# Patient Record
Sex: Female | Born: 1980 | Race: Black or African American | Hispanic: No | Marital: Single | Smoking: Never smoker
Health system: Southern US, Community
[De-identification: ages and names within clinical notes are randomized; demographics above are authoritative.]

## PROBLEM LIST (undated history)

## (undated) DIAGNOSIS — R6889 Other general symptoms and signs: Secondary | ICD-10-CM

## (undated) DIAGNOSIS — Z8742 Personal history of other diseases of the female genital tract: Secondary | ICD-10-CM

## (undated) DIAGNOSIS — Z87440 Personal history of urinary (tract) infections: Secondary | ICD-10-CM

## (undated) DIAGNOSIS — B373 Candidiasis of vulva and vagina: Secondary | ICD-10-CM

## (undated) DIAGNOSIS — Z923 Personal history of irradiation: Secondary | ICD-10-CM

## (undated) DIAGNOSIS — R102 Pelvic and perineal pain: Secondary | ICD-10-CM

## (undated) DIAGNOSIS — B379 Candidiasis, unspecified: Secondary | ICD-10-CM

## (undated) DIAGNOSIS — N760 Acute vaginitis: Secondary | ICD-10-CM

## (undated) DIAGNOSIS — B009 Herpesviral infection, unspecified: Secondary | ICD-10-CM

## (undated) DIAGNOSIS — R011 Cardiac murmur, unspecified: Secondary | ICD-10-CM

## (undated) DIAGNOSIS — Z87898 Personal history of other specified conditions: Secondary | ICD-10-CM

## (undated) DIAGNOSIS — B9689 Other specified bacterial agents as the cause of diseases classified elsewhere: Secondary | ICD-10-CM

## (undated) DIAGNOSIS — R5383 Other fatigue: Secondary | ICD-10-CM

## (undated) DIAGNOSIS — C50919 Malignant neoplasm of unspecified site of unspecified female breast: Secondary | ICD-10-CM

## (undated) DIAGNOSIS — Z8619 Personal history of other infectious and parasitic diseases: Secondary | ICD-10-CM

## (undated) HISTORY — DX: Personal history of urinary (tract) infections: Z87.440

## (undated) HISTORY — DX: Pelvic and perineal pain: R10.2

## (undated) HISTORY — DX: Candidiasis of vulva and vagina: B37.3

## (undated) HISTORY — DX: Herpesviral infection, unspecified: B00.9

## (undated) HISTORY — DX: Cardiac murmur, unspecified: R01.1

## (undated) HISTORY — DX: Other specified bacterial agents as the cause of diseases classified elsewhere: B96.89

## (undated) HISTORY — DX: Candidiasis, unspecified: B37.9

## (undated) HISTORY — DX: Malignant neoplasm of unspecified site of unspecified female breast: C50.919

## (undated) HISTORY — DX: Personal history of other specified conditions: Z87.898

## (undated) HISTORY — DX: Other general symptoms and signs: R68.89

## (undated) HISTORY — DX: Other specified bacterial agents as the cause of diseases classified elsewhere: N76.0

## (undated) HISTORY — DX: Personal history of other infectious and parasitic diseases: Z86.19

## (undated) HISTORY — DX: Other fatigue: R53.83

## (undated) HISTORY — DX: Personal history of other diseases of the female genital tract: Z87.42

---
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
## 2002-01-16 HISTORY — PX: TOE SURGERY: SHX1073

## 2002-03-26 ENCOUNTER — Other Ambulatory Visit: Admission: RE | Admit: 2002-03-26 | Discharge: 2002-03-26 | Payer: Self-pay | Admitting: Obstetrics and Gynecology

## 2003-02-27 ENCOUNTER — Inpatient Hospital Stay (HOSPITAL_COMMUNITY): Admission: AD | Admit: 2003-02-27 | Discharge: 2003-02-27 | Payer: Self-pay | Admitting: *Deleted

## 2003-03-27 ENCOUNTER — Observation Stay (HOSPITAL_COMMUNITY): Admission: AD | Admit: 2003-03-27 | Discharge: 2003-03-28 | Payer: Self-pay | Admitting: Obstetrics and Gynecology

## 2003-04-21 ENCOUNTER — Inpatient Hospital Stay (HOSPITAL_COMMUNITY): Admission: AD | Admit: 2003-04-21 | Discharge: 2003-04-21 | Payer: Self-pay | Admitting: Obstetrics and Gynecology

## 2003-07-11 ENCOUNTER — Inpatient Hospital Stay (HOSPITAL_COMMUNITY): Admission: AD | Admit: 2003-07-11 | Discharge: 2003-07-14 | Payer: Self-pay | Admitting: Obstetrics and Gynecology

## 2003-07-11 ENCOUNTER — Inpatient Hospital Stay (HOSPITAL_COMMUNITY): Admission: AD | Admit: 2003-07-11 | Discharge: 2003-07-11 | Payer: Self-pay | Admitting: Obstetrics and Gynecology

## 2003-08-25 ENCOUNTER — Other Ambulatory Visit: Admission: RE | Admit: 2003-08-25 | Discharge: 2003-08-25 | Payer: Self-pay | Admitting: Obstetrics and Gynecology

## 2003-11-17 DIAGNOSIS — Z8742 Personal history of other diseases of the female genital tract: Secondary | ICD-10-CM

## 2003-11-17 HISTORY — DX: Personal history of other diseases of the female genital tract: Z87.42

## 2004-09-13 ENCOUNTER — Other Ambulatory Visit: Admission: RE | Admit: 2004-09-13 | Discharge: 2004-09-13 | Payer: Self-pay | Admitting: Obstetrics and Gynecology

## 2005-01-16 DIAGNOSIS — R5383 Other fatigue: Secondary | ICD-10-CM

## 2005-01-16 DIAGNOSIS — R6889 Other general symptoms and signs: Secondary | ICD-10-CM

## 2005-01-16 HISTORY — DX: Other fatigue: R53.83

## 2005-01-16 HISTORY — DX: Other general symptoms and signs: R68.89

## 2005-04-16 DIAGNOSIS — B3731 Acute candidiasis of vulva and vagina: Secondary | ICD-10-CM

## 2005-04-16 DIAGNOSIS — B373 Candidiasis of vulva and vagina: Secondary | ICD-10-CM

## 2005-04-16 HISTORY — DX: Acute candidiasis of vulva and vagina: B37.31

## 2005-04-16 HISTORY — DX: Candidiasis of vulva and vagina: B37.3

## 2005-10-05 ENCOUNTER — Other Ambulatory Visit: Admission: RE | Admit: 2005-10-05 | Discharge: 2005-10-05 | Payer: Self-pay | Admitting: Obstetrics and Gynecology

## 2006-04-05 ENCOUNTER — Encounter: Admission: RE | Admit: 2006-04-05 | Discharge: 2006-04-05 | Payer: Self-pay | Admitting: Family Medicine

## 2010-02-06 ENCOUNTER — Encounter: Payer: Self-pay | Admitting: Family Medicine

## 2010-08-12 DIAGNOSIS — Z8619 Personal history of other infectious and parasitic diseases: Secondary | ICD-10-CM

## 2010-08-12 HISTORY — DX: Personal history of other infectious and parasitic diseases: Z86.19

## 2011-01-25 DIAGNOSIS — N760 Acute vaginitis: Secondary | ICD-10-CM | POA: Insufficient documentation

## 2011-01-25 DIAGNOSIS — B9689 Other specified bacterial agents as the cause of diseases classified elsewhere: Secondary | ICD-10-CM | POA: Insufficient documentation

## 2011-06-01 ENCOUNTER — Telehealth: Payer: Self-pay | Admitting: Obstetrics and Gynecology

## 2011-06-01 NOTE — Telephone Encounter (Signed)
Triage/epic 

## 2011-06-05 NOTE — Telephone Encounter (Signed)
Pt states she had TAB 1 week ago and wants IUD asap.  Wanted appt with EP.  Sch for 06-15-11.  ld

## 2011-06-05 NOTE — Telephone Encounter (Signed)
TAB  05-31-11

## 2011-06-05 NOTE — Telephone Encounter (Signed)
sr pt 

## 2011-06-14 DIAGNOSIS — B9689 Other specified bacterial agents as the cause of diseases classified elsewhere: Secondary | ICD-10-CM

## 2011-06-15 ENCOUNTER — Encounter: Payer: Self-pay | Admitting: Obstetrics and Gynecology

## 2011-06-27 ENCOUNTER — Ambulatory Visit: Payer: Self-pay | Admitting: Obstetrics and Gynecology

## 2011-06-30 ENCOUNTER — Encounter: Payer: Self-pay | Admitting: Obstetrics and Gynecology

## 2011-08-15 ENCOUNTER — Telehealth: Payer: Self-pay | Admitting: Obstetrics and Gynecology

## 2011-08-15 ENCOUNTER — Other Ambulatory Visit: Payer: Self-pay

## 2011-08-15 MED ORDER — VALACYCLOVIR HCL 500 MG PO TABS: 500.0000 mg | ORAL_TABLET | Freq: Every day | ORAL | Status: AC

## 2011-08-15 NOTE — Telephone Encounter (Signed)
TC TO PT REGARDING REQUEST FOR MED VALTREX. INFORMED PT THAT I NEED TO SCHEDULE AN AEX BEFORE I SEND IN RX . SCHEDULED PT AEX ON 08-24-11 AT 2:15 PM. PT ALSO STATED THAT SHE HAD A EAB IN MAY AND WANT TO MAKE SURE EVERYTHING IS OKAY.

## 2011-08-24 ENCOUNTER — Ambulatory Visit: Payer: Self-pay | Admitting: Obstetrics and Gynecology

## 2011-09-14 ENCOUNTER — Ambulatory Visit: Payer: Self-pay | Admitting: Obstetrics and Gynecology

## 2011-10-05 ENCOUNTER — Ambulatory Visit (INDEPENDENT_AMBULATORY_CARE_PROVIDER_SITE_OTHER): Payer: BC Managed Care – PPO | Admitting: Obstetrics and Gynecology

## 2011-10-05 ENCOUNTER — Encounter: Payer: Self-pay | Admitting: Obstetrics and Gynecology

## 2011-10-05 VITALS — BP 118/78 | HR 78 | Ht 66.5 in | Wt 236.0 lb

## 2011-10-05 DIAGNOSIS — Z309 Encounter for contraceptive management, unspecified: Secondary | ICD-10-CM

## 2011-10-05 DIAGNOSIS — Z124 Encounter for screening for malignant neoplasm of cervix: Secondary | ICD-10-CM

## 2011-10-05 DIAGNOSIS — IMO0001 Reserved for inherently not codable concepts without codable children: Secondary | ICD-10-CM

## 2011-10-05 DIAGNOSIS — Z01419 Encounter for gynecological examination (general) (routine) without abnormal findings: Secondary | ICD-10-CM

## 2011-10-05 MED ORDER — NORGESTIMATE-ETH ESTRADIOL 0.25-35 MG-MCG PO TABS: 1.0000 | ORAL_TABLET | Freq: Every day | ORAL | Status: DC

## 2011-10-05 MED ORDER — AMBULATORY NON FORMULARY MEDICATION: 600.0000 mg | Status: DC

## 2011-10-05 NOTE — Progress Notes (Signed)
Subjective:    Kara Webb is a 31 y.o. female, G3P1011, who presents for an annual exam. The patient wants a type of contraception.  Has used IUD, BCPs and Implanon in the past.  Also wants GC/CT testing.  Menstrual cycle:   LMP: Patient's last menstrual period was 09/18/2011.             Review of Systems Pertinent items are noted in HPI. Denies pelvic pain, urinary tract symptoms, vaginitis symptoms, irregular bleeding, menopausal symptoms, change in bowel habits or rectal bleeding   Objective:    BP 118/78  Pulse 78  Ht 5' 6.5" (1.689 m)  Wt 236 lb (107.049 kg)  BMI 37.52 kg/m2  LMP 09/18/2011 Wt Readings from Last 1 Encounters:  10/05/11 236 lb (107.049 kg)   Body mass index is 37.52 kg/(m^2). General Appearance: Alert, no acute distress HEENT: Grossly normal Neck / Thyroid: Supple, no thyromegaly or cervical adenopathy Lungs: Clear to auscultation bilaterally Back: No CVA tenderness Breast Exam: No masses or nodes.No dimpling, nipple retraction or discharge. Cardiovascular: Regular rate and rhythm.  Gastrointestinal: Soft, non-tender, no masses or organomegaly Pelvic Exam: EGBUS-wnl, vagina-normal rugae, cervix- without lesions or tenderness, uterus appears normal size shape and consistency, adnexae-no masses or tenderness Rectovaginal: no masses and normal sphincter tone Lymphatic Exam: Non-palpable nodes in neck, clavicular,  axillary, or inguinal regions  Skin: no rashes or abnormalities Extremities: no clubbing cyanosis or edema  Neurologic: grossly normal Psychiatric: Alert and oriented  UPT: negative   Assessment:   Routine GYN Exam   Plan:  Given handout on contraception and reviewed contents   Sprintec # 1 1 po qd starting Sunday after menses with BUM x 1st cycle 11 refills  Boric Acid Capsule 600 mg #30 1 pv twice weekly prn 11 refills  BCP Instruction sheet given  PAP sent  RTO 1 year or prn  Pier Bosher,ELMIRAPA-C

## 2011-10-05 NOTE — Progress Notes (Signed)
Regular Periods: yes Mammogram: no  Monthly Breast Ex.: yes Exercise: no  Tetanus < 10 years: yes Seatbelts: yes  NI. Bladder Functn.: yes Abuse at home: no  Daily BM's: yes Stressful Work: yes  Healthy Diet: yes Sigmoid-Colonoscopy: NO  Calcium: no Medical problems this year: WANT BIRTH CONTROL   LAST PAP:6/12  Contraception: NONE  Mammogram:  NO  PCP: DR. Pecola Leisure  PMH: NO CHANGE  FMH: NO CHANGE  Last Bone Scan: NO  PT IS SINGLE

## 2011-10-09 LAB — PAP IG, CT-NG, RFX HPV ASCU: Chlamydia Probe Amp: NEGATIVE

## 2013-11-17 ENCOUNTER — Encounter: Payer: Self-pay | Admitting: Obstetrics and Gynecology

## 2014-09-10 ENCOUNTER — Other Ambulatory Visit: Payer: Self-pay

## 2014-09-10 ENCOUNTER — Other Ambulatory Visit: Payer: Self-pay | Admitting: Family Medicine

## 2014-09-10 DIAGNOSIS — M79605 Pain in left leg: Secondary | ICD-10-CM

## 2014-09-11 ENCOUNTER — Ambulatory Visit
Admission: RE | Admit: 2014-09-11 | Discharge: 2014-09-11 | Disposition: A | Discharge status: Home / Self Care | Payer: 59 | Source: Ambulatory Visit | Attending: Family Medicine | Admitting: Family Medicine

## 2014-09-11 DIAGNOSIS — M79605 Pain in left leg: Secondary | ICD-10-CM

## 2018-09-24 ENCOUNTER — Other Ambulatory Visit: Payer: Self-pay

## 2018-09-24 DIAGNOSIS — R0789 Other chest pain: Secondary | ICD-10-CM | POA: Diagnosis not present

## 2018-09-24 DIAGNOSIS — R0602 Shortness of breath: Secondary | ICD-10-CM | POA: Diagnosis present

## 2018-09-24 DIAGNOSIS — Z79899 Other long term (current) drug therapy: Secondary | ICD-10-CM | POA: Diagnosis not present

## 2018-09-24 DIAGNOSIS — J9801 Acute bronchospasm: Secondary | ICD-10-CM | POA: Insufficient documentation

## 2018-09-25 ENCOUNTER — Emergency Department (HOSPITAL_COMMUNITY)
Admission: EM | Admit: 2018-09-25 | Discharge: 2018-09-25 | Disposition: A | Discharge status: Home / Self Care | Payer: Managed Care, Other (non HMO) | Attending: Emergency Medicine | Admitting: Emergency Medicine

## 2018-09-25 ENCOUNTER — Other Ambulatory Visit: Payer: Self-pay

## 2018-09-25 ENCOUNTER — Emergency Department (HOSPITAL_COMMUNITY): Payer: Managed Care, Other (non HMO)

## 2018-09-25 ENCOUNTER — Encounter (HOSPITAL_COMMUNITY): Payer: Self-pay | Admitting: Family Medicine

## 2018-09-25 DIAGNOSIS — J9801 Acute bronchospasm: Secondary | ICD-10-CM

## 2018-09-25 DIAGNOSIS — R0789 Other chest pain: Secondary | ICD-10-CM

## 2018-09-25 LAB — BASIC METABOLIC PANEL
Anion gap: 8 (ref 5–15)
BUN: 15 mg/dL (ref 6–20)
CO2: 25 mmol/L (ref 22–32)
Calcium: 9.7 mg/dL (ref 8.9–10.3)
Chloride: 105 mmol/L (ref 98–111)
Creatinine, Ser: 0.75 mg/dL (ref 0.44–1.00)
GFR calc Af Amer: >60 mL/min (ref >60)
GFR calc non Af Amer: >60 mL/min (ref >60)
Glucose, Bld: 112 mg/dL — ABNORMAL HIGH (ref 70–99)
Potassium: 3.5 mmol/L (ref 3.5–5.1)
Sodium: 138 mmol/L (ref 135–145)

## 2018-09-25 LAB — CBC
HCT: 39.4 % (ref 36.0–46.0)
Hemoglobin: 12.5 g/dL (ref 12.0–15.0)
MCH: 26.3 pg (ref 26.0–34.0)
MCHC: 31.7 g/dL (ref 30.0–36.0)
MCV: 82.8 fL (ref 80.0–100.0)
Platelets: 202 10*3/uL (ref 150–400)
RBC: 4.76 MIL/uL (ref 3.87–5.11)
RDW: 14.4 % (ref 11.5–15.5)
WBC: 6.9 10*3/uL (ref 4.0–10.5)
nRBC: 0 % (ref 0.0–0.2)

## 2018-09-25 LAB — I-STAT BETA HCG BLOOD, ED (NOT ORDERABLE): I-stat hCG, quantitative: <5 m[IU]/mL (ref <5)

## 2018-09-25 LAB — TROPONIN I (HIGH SENSITIVITY)
Troponin I (High Sensitivity): <2 ng/L (ref <18)
Troponin I (High Sensitivity): <2 ng/L (ref <18)

## 2018-09-25 MED ORDER — SODIUM CHLORIDE 0.9% FLUSH: 3.0000 mL | Freq: Once | INTRAVENOUS | Status: DC

## 2018-09-25 MED ORDER — ALBUTEROL SULFATE HFA 108 (90 BASE) MCG/ACT IN AERS
2.0000 | INHALATION_SPRAY | RESPIRATORY_TRACT | Status: DC | PRN
  Administered 2018-09-25: 2 via RESPIRATORY_TRACT
  Filled 2018-09-25 (×2): qty 6.7

## 2018-09-25 MED ORDER — NAPROXEN 500 MG PO TABS
500.0000 mg | ORAL_TABLET | Freq: Once | ORAL | Status: AC
  Administered 2018-09-25: 500 mg via ORAL
  Filled 2018-09-25: qty 1

## 2018-09-25 MED ORDER — AEROCHAMBER Z-STAT PLUS/MEDIUM MISC
1.0000 | Freq: Once | Status: AC
  Administered 2018-09-25: 1
  Filled 2018-09-25: qty 1

## 2018-09-25 NOTE — ED Provider Notes (Signed)
[facility] DEPT Provider Note: Georgena Spurling, MD, FACEP  CSN: [id number] MRN: [id number] ARRIVAL: 09/24/18 at 2341 ROOM: [place]  Chest Pain and Shortness of Breath   HISTORY OF PRESENT ILLNESS  09/25/18 4:47 AM Kara Webb is a 38 y.o. female who has had shortness of breath of moderate severity, worse with exertion, for about the past 3 weeks.  She is also had an achy pain in her right upper chest.  This pain is intermittent.  It is not pleuritic.  It is worse when supine and improved with sitting or standing up.  She rates his pain as a 3 out of 10.  She has not had leg swelling or pain.  She has not had a cough.  She has no personal or family history of asthma.  She googled her symptoms last night and this made her anxious that something serious may be wrong.   Past Medical History:  Diagnosis Date  . BV (bacterial vaginosis)   . Cold intolerance 01/2005  . Fatigue 01/2005  . H/O chlamydia infection 08/12/10  . H/O urinary frequency 11/2000  . H/O vaginal discharge 11/2003  . H/O varicella   . Heart murmur    Born with murmur  . HSV-1 infection   . Hx: UTI (urinary tract infection)   . Pelvic pain   . Vulvovaginal candidiasis 04/2005  . Yeast infection     Past Surgical History:  Procedure Laterality Date  . TOE SURGERY  2004  . WISDOM TOOTH EXTRACTION  2003    Family History  Problem Relation Age of Onset  . Hypertension Maternal Grandmother   . Diabetes Maternal Grandmother   . Hypertension Maternal Grandfather     Social History   Tobacco Use  . Smoking status: Never Smoker  . Smokeless tobacco: Never Used  Substance Use Topics  . Alcohol use: No  . Drug use: No    Prior to Admission medications   Medication Sig Start Date End Date Taking? Authorizing Provider  AMBULATORY NON FORMULARY MEDICATION Place 600 mg vaginally 2 (two) times a week. Medication Name: Boric Acid Capsules 600 mg 1 pv twice a week prn 10/05/11   Earnstine Regal, PA-C  norgestimate-ethinyl estradiol (ORTHO-CYCLEN,SPRINTEC,PREVIFEM) 0.25-35 MG-MCG tablet Take 1 tablet by mouth daily. 10/05/11   Earnstine Regal, PA-C    Allergies Patient has no known allergies.   REVIEW OF SYSTEMS  Negative except as noted here or in the History of Present Illness.   PHYSICAL EXAMINATION  Initial Vital Signs Blood pressure (!) 155/115, pulse 96, temperature 99.5 F (37.5 C), temperature source Oral, resp. rate 18, height 5\' 6"  (1.676 m), weight 132 kg, SpO2 100 %.  Examination General: Well-developed, well-nourished female in no acute distress; appearance consistent with age of record HENT: normocephalic; atraumatic Eyes: Normal appearance Neck: supple Heart: regular rate and rhythm Lungs: Decreased air movement without frank wheezing Abdomen: soft; nondistended; nontender; bowel sounds present Extremities: No deformity; full range of motion; pulses normal; no edema Neurologic: Awake, alert and oriented; motor function intact in all extremities and symmetric; no facial droop Skin: Warm and dry Psychiatric: Normal mood and affect   RESULTS  Summary of this visit's results, reviewed by myself:   EKG Interpretation  Date/Time:  Wednesday September 25 2018 00:46:09 EDT Ventricular Rate:  103 PR Interval:    QRS Duration: 89 QT Interval:  329 QTC Calculation: 431 R Axis:   43 Text Interpretation:  Sinus tachycardia Low voltage, precordial  leads No previous ECGs available Confirmed by Denyla Cortese, Jonny RuizJohn ([id number]) on 09/25/2018 12:58:31 AM      Laboratory Studies: Results for orders placed or performed during the hospital encounter of 09/25/18 (from the past 24 hour(s))  Basic metabolic panel     Status: Abnormal   Collection Time: 09/25/18 12:52 AM  Result Value Ref Range   Sodium 138 135 - 145 mmol/L   Potassium 3.5 3.5 - 5.1 mmol/L   Chloride 105 98 - 111 mmol/L   CO2 25 22 - 32 mmol/L   Glucose, Bld 112 (H) 70 - 99 mg/dL   BUN 15 6 - 20 mg/dL    Creatinine, Ser 2.950.75 0.44 - 1.00 mg/dL   Calcium 9.7 8.9 - 28.410.3 mg/dL   GFR calc non Af Amer >60 >60 mL/min   GFR calc Af Amer >60 >60 mL/min   Anion gap 8 5 - 15  CBC     Status: None   Collection Time: 09/25/18 12:52 AM  Result Value Ref Range   WBC 6.9 4.0 - 10.5 K/uL   RBC 4.76 3.87 - 5.11 MIL/uL   Hemoglobin 12.5 12.0 - 15.0 g/dL   HCT 13.239.4 44.036.0 - 10.246.0 %   MCV 82.8 80.0 - 100.0 fL   MCH 26.3 26.0 - 34.0 pg   MCHC 31.7 30.0 - 36.0 g/dL   RDW 72.514.4 36.611.5 - 44.015.5 %   Platelets 202 150 - 400 K/uL   nRBC 0.0 0.0 - 0.2 %  Troponin I (High Sensitivity)     Status: None   Collection Time: 09/25/18 12:52 AM  Result Value Ref Range   Troponin I (High Sensitivity) <2 <18 ng/L  I-Stat beta hCG blood, ED     Status: None   Collection Time: 09/25/18  1:01 AM  Result Value Ref Range   I-stat hCG, quantitative <5.0 <5 mIU/mL   Comment 3          Troponin I (High Sensitivity)     Status: None   Collection Time: 09/25/18  5:47 AM  Result Value Ref Range   Troponin I (High Sensitivity) <2.00 <18 ng/L   Imaging Studies: Dg Chest 2 View  Result Date: 09/25/2018 CLINICAL DATA:  Mid chest pain EXAM: CHEST - 2 VIEW COMPARISON:  None. FINDINGS: Heart and mediastinal contours are within normal limits. No focal opacities or effusions. No acute bony abnormality. IMPRESSION: No active cardiopulmonary disease. Electronically Signed   By: Charlett NoseKevin  Dover M.D.   On: 09/25/2018 00:42    ED COURSE and MDM  Nursing notes and initial vitals signs, including pulse oximetry, reviewed.  Vitals:   09/25/18 0014 09/25/18 0549  BP: (!) 155/115 (!) 144/96  Pulse: 96 81  Resp: 18 17  Temp: 99.5 F (37.5 C)   TempSrc: Oral   SpO2: 100% 98%  Weight: 132 kg   Height: 5\' 6"  (1.676 m)    Wells score 0.   6:34 AM Patient's dyspnea relieved, air movement improved after albuterol inhaler treatment.  Patient is likely having bronchospasm possibly due to environmental exposure.  She has no personal or family  history of asthma.  Chest pain may be due to musculoskeletal irritation from dyspnea.  Her well score is 0 and her diagnostic work-up was reassuring.  PROCEDURES    ED DIAGNOSES     ICD-10-CM   1. Acute bronchospasm  J98.01   2. Atypical chest pain  R07.89        Paula LibraMolpus, Erice Ahles, MD 09/25/18 [phone]

## 2018-09-25 NOTE — ED Triage Notes (Signed)
Patient is complaining of right sided chest pain and shortness of breath that started 3 weeks ago. Chest pain is described as intermittent dull, cramping pain. Denies the pain radiates. No other associated symptoms besides shortness of breath. Patient states she has had a lot of anxiety lately.

## 2018-09-25 NOTE — ED Notes (Signed)
Pt came up to this writer x2 complaining that no one has came into check on her since she has been back in her room for 2 hours. I explained to the patient both times she has addressed me, that her nurse, her techs, and the doctor has been in another room with a critical patient.  Patient rolled eyes and walked away very irritated and slammed door when she returned to her room.

## 2021-03-17 IMAGING — CR DG CHEST 2V
2 series · 2 of 2 positions shown · non-contrast
Comparison: None.

CLINICAL DATA: Mid chest pain

EXAM:
CHEST - 2 VIEW

[w chest pa]
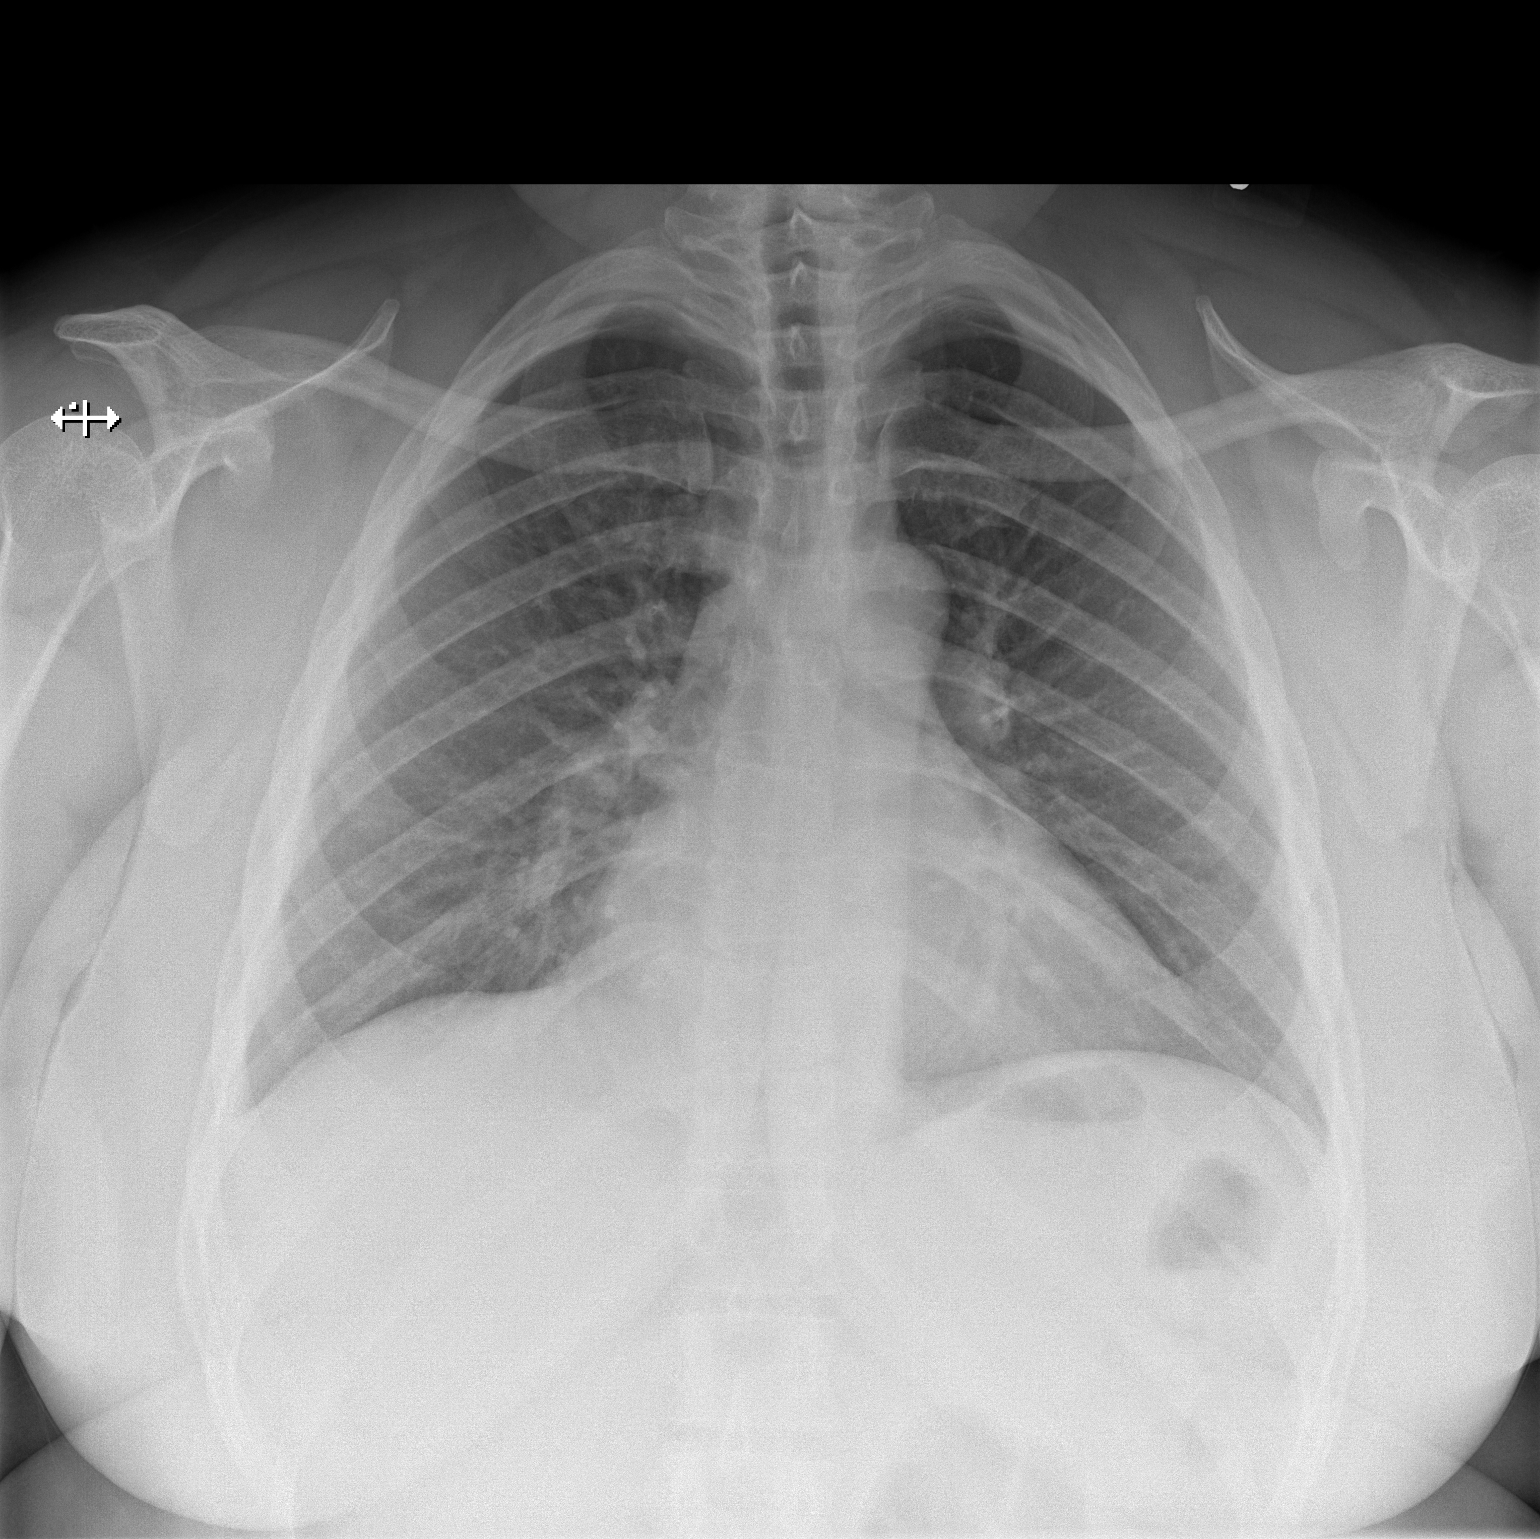

[w chest lat]
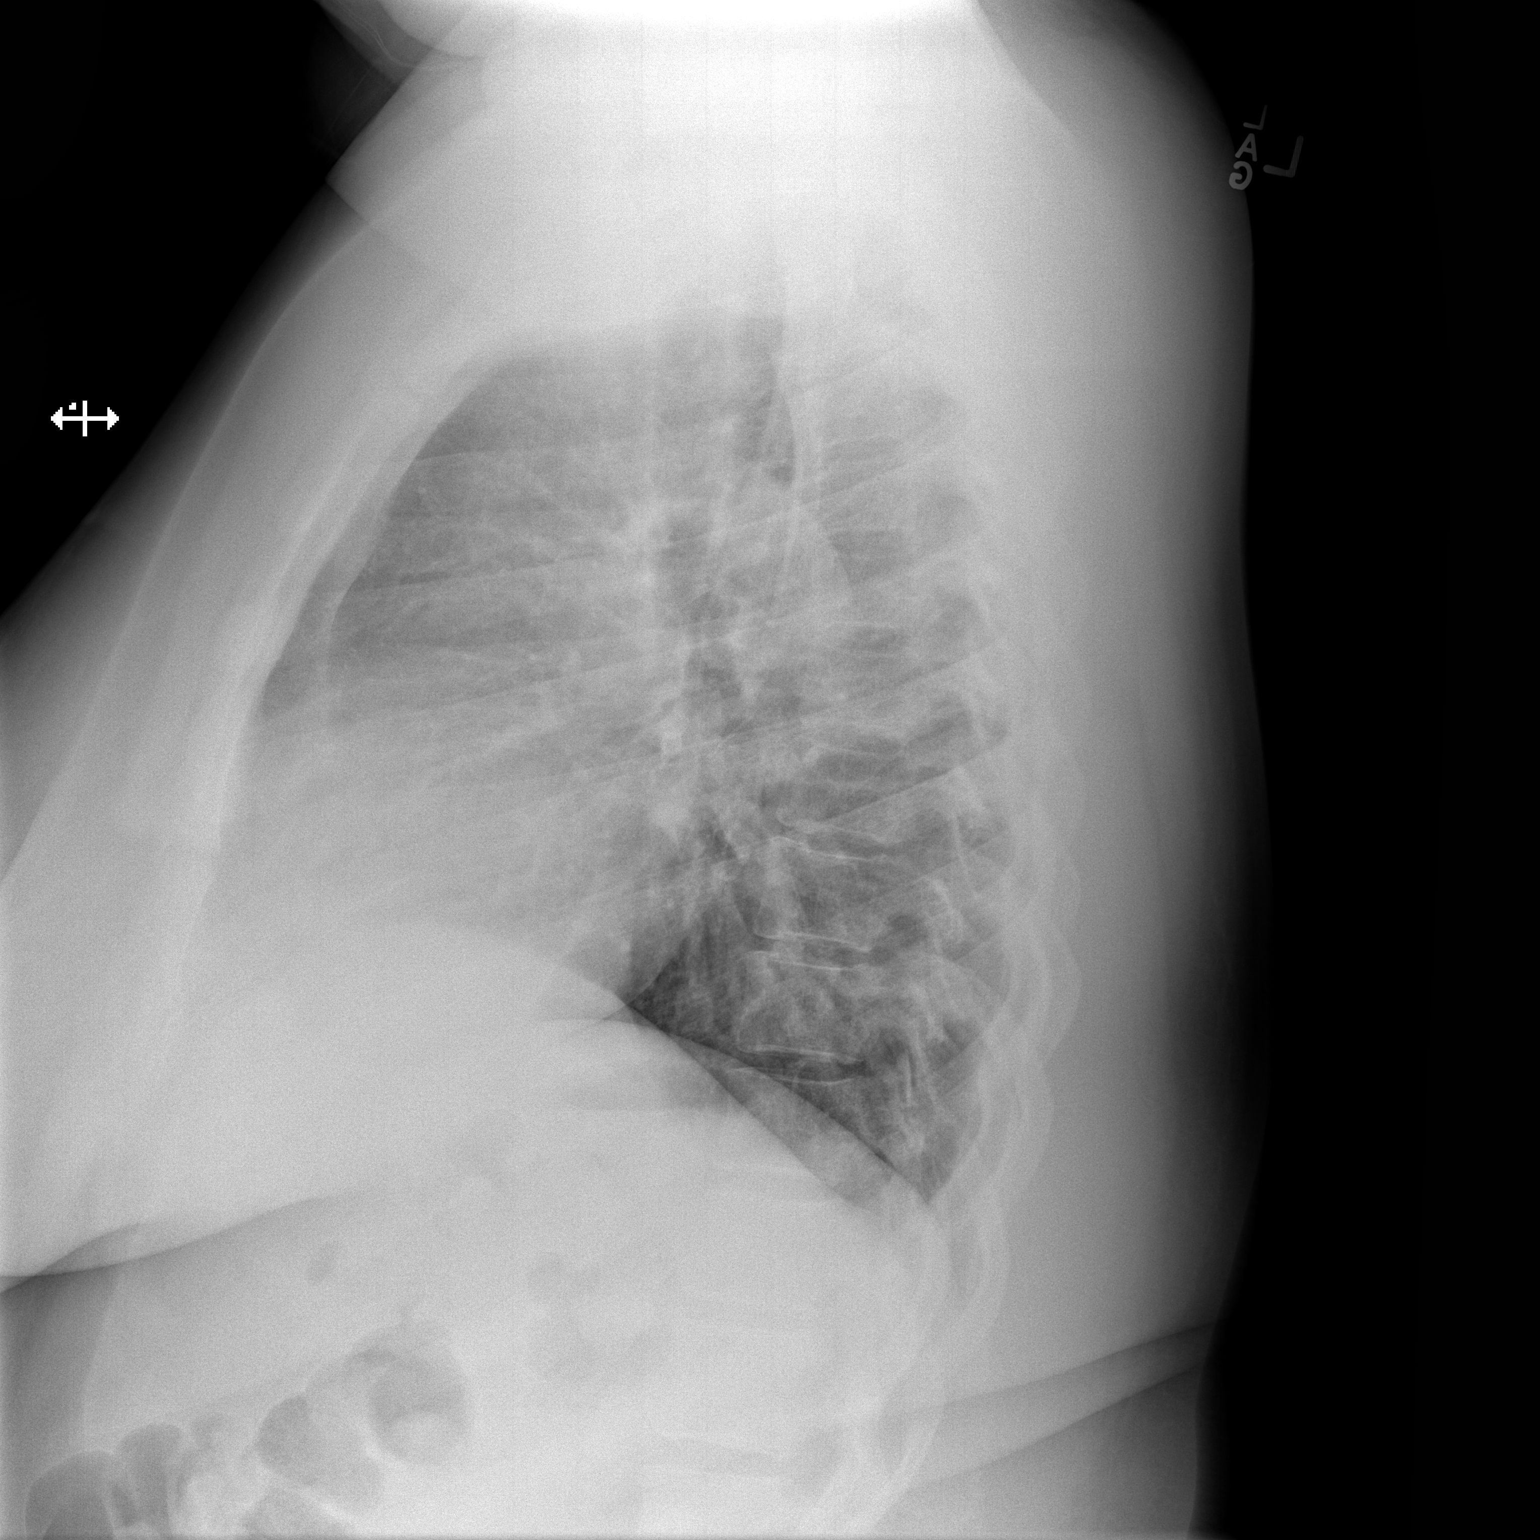

[2 of 2 positions shown; findings below may reference images not displayed]

FINDINGS: Heart and mediastinal contours are within normal limits. No focal
opacities or effusions. No acute bony abnormality.
IMPRESSION: No active cardiopulmonary disease.

## 2021-08-19 DIAGNOSIS — F411 Generalized anxiety disorder: Secondary | ICD-10-CM | POA: Diagnosis not present

## 2021-08-19 DIAGNOSIS — F4321 Adjustment disorder with depressed mood: Secondary | ICD-10-CM | POA: Diagnosis not present

## 2021-09-27 DIAGNOSIS — R739 Hyperglycemia, unspecified: Secondary | ICD-10-CM | POA: Diagnosis not present

## 2021-09-29 DIAGNOSIS — L700 Acne vulgaris: Secondary | ICD-10-CM | POA: Diagnosis not present

## 2021-11-01 DIAGNOSIS — L700 Acne vulgaris: Secondary | ICD-10-CM | POA: Diagnosis not present

## 2021-11-09 DIAGNOSIS — F4321 Adjustment disorder with depressed mood: Secondary | ICD-10-CM | POA: Diagnosis not present

## 2021-11-09 DIAGNOSIS — F411 Generalized anxiety disorder: Secondary | ICD-10-CM | POA: Diagnosis not present

## 2021-11-25 DIAGNOSIS — F4321 Adjustment disorder with depressed mood: Secondary | ICD-10-CM | POA: Diagnosis not present

## 2021-11-25 DIAGNOSIS — F411 Generalized anxiety disorder: Secondary | ICD-10-CM | POA: Diagnosis not present

## 2021-12-06 DIAGNOSIS — L700 Acne vulgaris: Secondary | ICD-10-CM | POA: Diagnosis not present

## 2021-12-19 DIAGNOSIS — L700 Acne vulgaris: Secondary | ICD-10-CM | POA: Diagnosis not present

## 2021-12-23 DIAGNOSIS — F411 Generalized anxiety disorder: Secondary | ICD-10-CM | POA: Diagnosis not present

## 2021-12-23 DIAGNOSIS — F4321 Adjustment disorder with depressed mood: Secondary | ICD-10-CM | POA: Diagnosis not present

## 2022-01-03 DIAGNOSIS — Z30430 Encounter for insertion of intrauterine contraceptive device: Secondary | ICD-10-CM | POA: Diagnosis not present

## 2022-01-03 DIAGNOSIS — N920 Excessive and frequent menstruation with regular cycle: Secondary | ICD-10-CM | POA: Diagnosis not present

## 2022-01-03 DIAGNOSIS — Z113 Encounter for screening for infections with a predominantly sexual mode of transmission: Secondary | ICD-10-CM | POA: Diagnosis not present

## 2022-01-05 DIAGNOSIS — F4321 Adjustment disorder with depressed mood: Secondary | ICD-10-CM | POA: Diagnosis not present

## 2022-01-05 DIAGNOSIS — F411 Generalized anxiety disorder: Secondary | ICD-10-CM | POA: Diagnosis not present

## 2022-01-24 DIAGNOSIS — L853 Xerosis cutis: Secondary | ICD-10-CM | POA: Diagnosis not present

## 2022-01-24 DIAGNOSIS — M791 Myalgia, unspecified site: Secondary | ICD-10-CM | POA: Diagnosis not present

## 2022-01-24 DIAGNOSIS — L700 Acne vulgaris: Secondary | ICD-10-CM | POA: Diagnosis not present

## 2022-01-24 DIAGNOSIS — K130 Diseases of lips: Secondary | ICD-10-CM | POA: Diagnosis not present

## 2022-02-02 DIAGNOSIS — L700 Acne vulgaris: Secondary | ICD-10-CM | POA: Diagnosis not present

## 2022-05-25 DIAGNOSIS — L700 Acne vulgaris: Secondary | ICD-10-CM | POA: Diagnosis not present

## 2022-06-08 DIAGNOSIS — L700 Acne vulgaris: Secondary | ICD-10-CM | POA: Diagnosis not present

## 2022-06-21 DIAGNOSIS — Z139 Encounter for screening, unspecified: Secondary | ICD-10-CM | POA: Diagnosis not present

## 2022-06-21 DIAGNOSIS — Z124 Encounter for screening for malignant neoplasm of cervix: Secondary | ICD-10-CM | POA: Diagnosis not present

## 2022-06-21 DIAGNOSIS — Z01419 Encounter for gynecological examination (general) (routine) without abnormal findings: Secondary | ICD-10-CM | POA: Diagnosis not present

## 2022-06-21 DIAGNOSIS — Z6841 Body Mass Index (BMI) 40.0 and over, adult: Secondary | ICD-10-CM | POA: Diagnosis not present

## 2022-06-27 DIAGNOSIS — N920 Excessive and frequent menstruation with regular cycle: Secondary | ICD-10-CM | POA: Diagnosis not present

## 2022-06-27 DIAGNOSIS — Z30431 Encounter for routine checking of intrauterine contraceptive device: Secondary | ICD-10-CM | POA: Diagnosis not present

## 2022-06-29 ENCOUNTER — Other Ambulatory Visit: Payer: Self-pay | Admitting: Obstetrics and Gynecology

## 2022-06-29 ENCOUNTER — Ambulatory Visit
Admission: RE | Admit: 2022-06-29 | Discharge: 2022-06-29 | Disposition: A | Discharge status: Home / Self Care | Payer: BC Managed Care – PPO | Source: Ambulatory Visit | Attending: Obstetrics and Gynecology | Admitting: Obstetrics and Gynecology

## 2022-06-29 DIAGNOSIS — T8389XA Other specified complication of genitourinary prosthetic devices, implants and grafts, initial encounter: Secondary | ICD-10-CM

## 2022-06-29 DIAGNOSIS — T8332XA Displacement of intrauterine contraceptive device, initial encounter: Secondary | ICD-10-CM | POA: Diagnosis not present

## 2022-07-04 DIAGNOSIS — F411 Generalized anxiety disorder: Secondary | ICD-10-CM | POA: Diagnosis not present

## 2022-07-10 DIAGNOSIS — L700 Acne vulgaris: Secondary | ICD-10-CM | POA: Diagnosis not present

## 2022-07-11 DIAGNOSIS — E559 Vitamin D deficiency, unspecified: Secondary | ICD-10-CM | POA: Diagnosis not present

## 2022-07-11 DIAGNOSIS — R635 Abnormal weight gain: Secondary | ICD-10-CM | POA: Diagnosis not present

## 2022-07-11 DIAGNOSIS — R7303 Prediabetes: Secondary | ICD-10-CM | POA: Diagnosis not present

## 2022-07-11 DIAGNOSIS — E785 Hyperlipidemia, unspecified: Secondary | ICD-10-CM | POA: Diagnosis not present

## 2022-07-11 DIAGNOSIS — Z0000 Encounter for general adult medical examination without abnormal findings: Secondary | ICD-10-CM | POA: Diagnosis not present

## 2022-07-14 ENCOUNTER — Other Ambulatory Visit (HOSPITAL_COMMUNITY): Payer: Self-pay

## 2022-07-14 MED ORDER — LUPRON DEPOT (3-MONTH) 11.25 MG IM KIT
PACK | INTRAMUSCULAR | 3 refills | Status: DC
  Filled 2022-07-14: qty 1, 1d supply, fill #0

## 2022-07-21 DIAGNOSIS — F411 Generalized anxiety disorder: Secondary | ICD-10-CM | POA: Diagnosis not present

## 2022-07-28 ENCOUNTER — Other Ambulatory Visit (HOSPITAL_COMMUNITY): Payer: Self-pay

## 2022-08-02 ENCOUNTER — Other Ambulatory Visit (HOSPITAL_COMMUNITY): Payer: Self-pay

## 2022-08-10 ENCOUNTER — Other Ambulatory Visit (HOSPITAL_COMMUNITY): Payer: Self-pay

## 2022-08-14 DIAGNOSIS — I10 Essential (primary) hypertension: Secondary | ICD-10-CM | POA: Diagnosis not present

## 2022-08-14 DIAGNOSIS — F419 Anxiety disorder, unspecified: Secondary | ICD-10-CM | POA: Diagnosis not present

## 2022-08-14 DIAGNOSIS — R7303 Prediabetes: Secondary | ICD-10-CM | POA: Diagnosis not present

## 2022-08-14 DIAGNOSIS — D649 Anemia, unspecified: Secondary | ICD-10-CM | POA: Diagnosis not present

## 2022-10-12 DIAGNOSIS — R635 Abnormal weight gain: Secondary | ICD-10-CM | POA: Diagnosis not present

## 2022-10-12 DIAGNOSIS — R030 Elevated blood-pressure reading, without diagnosis of hypertension: Secondary | ICD-10-CM | POA: Diagnosis not present

## 2022-10-12 DIAGNOSIS — D649 Anemia, unspecified: Secondary | ICD-10-CM | POA: Diagnosis not present

## 2022-10-12 DIAGNOSIS — R7303 Prediabetes: Secondary | ICD-10-CM | POA: Diagnosis not present

## 2022-11-14 ENCOUNTER — Other Ambulatory Visit: Payer: Self-pay | Admitting: Obstetrics and Gynecology

## 2022-11-14 DIAGNOSIS — N6310 Unspecified lump in the right breast, unspecified quadrant: Secondary | ICD-10-CM | POA: Diagnosis not present

## 2022-12-12 ENCOUNTER — Ambulatory Visit
Admission: RE | Admit: 2022-12-12 | Discharge: 2022-12-12 | Disposition: A | Discharge status: Home / Self Care | Payer: No Typology Code available for payment source | Source: Ambulatory Visit | Attending: Obstetrics and Gynecology | Admitting: Obstetrics and Gynecology

## 2022-12-12 ENCOUNTER — Ambulatory Visit
Admission: RE | Admit: 2022-12-12 | Discharge: 2022-12-12 | Disposition: A | Discharge status: Home / Self Care | Payer: BC Managed Care – PPO | Source: Ambulatory Visit | Attending: Obstetrics and Gynecology | Admitting: Obstetrics and Gynecology

## 2022-12-12 ENCOUNTER — Other Ambulatory Visit: Payer: Self-pay | Admitting: Obstetrics and Gynecology

## 2022-12-12 DIAGNOSIS — N6310 Unspecified lump in the right breast, unspecified quadrant: Secondary | ICD-10-CM

## 2022-12-12 DIAGNOSIS — N6311 Unspecified lump in the right breast, upper outer quadrant: Secondary | ICD-10-CM | POA: Diagnosis not present

## 2022-12-12 DIAGNOSIS — R599 Enlarged lymph nodes, unspecified: Secondary | ICD-10-CM

## 2022-12-18 ENCOUNTER — Ambulatory Visit
Admission: RE | Admit: 2022-12-18 | Discharge: 2022-12-18 | Disposition: A | Discharge status: Home / Self Care | Payer: BC Managed Care – PPO | Source: Ambulatory Visit | Attending: Obstetrics and Gynecology | Admitting: Obstetrics and Gynecology

## 2022-12-18 DIAGNOSIS — N6311 Unspecified lump in the right breast, upper outer quadrant: Secondary | ICD-10-CM | POA: Diagnosis not present

## 2022-12-18 DIAGNOSIS — C773 Secondary and unspecified malignant neoplasm of axilla and upper limb lymph nodes: Secondary | ICD-10-CM | POA: Diagnosis not present

## 2022-12-18 DIAGNOSIS — R590 Localized enlarged lymph nodes: Secondary | ICD-10-CM | POA: Diagnosis not present

## 2022-12-18 DIAGNOSIS — R599 Enlarged lymph nodes, unspecified: Secondary | ICD-10-CM

## 2022-12-18 DIAGNOSIS — C50411 Malignant neoplasm of upper-outer quadrant of right female breast: Secondary | ICD-10-CM | POA: Diagnosis not present

## 2022-12-18 DIAGNOSIS — N6310 Unspecified lump in the right breast, unspecified quadrant: Secondary | ICD-10-CM

## 2022-12-18 HISTORY — PX: BREAST BIOPSY: SHX20

## 2022-12-19 LAB — SURGICAL PATHOLOGY

## 2022-12-20 ENCOUNTER — Telehealth: Payer: Self-pay | Admitting: Hematology and Oncology

## 2022-12-20 NOTE — Telephone Encounter (Signed)
Spoke to patient to confirm upcoming afternoon [facility] clinic appointment on 12/11, paperwork will be sent via e-mail.   Gave location and time, also informed patient that the surgeon's office would be calling as well to get information from them similar to the packet that they will be receiving so make sure to do both.  Reminded patient that all providers will be coming to the clinic to see them HERE and if they had any questions to not hesitate to reach back out to myself or their navigators.

## 2022-12-25 ENCOUNTER — Encounter: Payer: Self-pay | Admitting: *Deleted

## 2022-12-25 DIAGNOSIS — Z171 Estrogen receptor negative status [ER-]: Secondary | ICD-10-CM | POA: Insufficient documentation

## 2022-12-26 DIAGNOSIS — F411 Generalized anxiety disorder: Secondary | ICD-10-CM | POA: Diagnosis not present

## 2022-12-27 ENCOUNTER — Inpatient Hospital Stay (HOSPITAL_BASED_OUTPATIENT_CLINIC_OR_DEPARTMENT_OTHER): Payer: BC Managed Care – PPO | Admitting: Genetic Counselor

## 2022-12-27 ENCOUNTER — Inpatient Hospital Stay (HOSPITAL_BASED_OUTPATIENT_CLINIC_OR_DEPARTMENT_OTHER): Payer: BC Managed Care – PPO | Admitting: Hematology and Oncology

## 2022-12-27 ENCOUNTER — Ambulatory Visit: Payer: BC Managed Care – PPO | Attending: Surgery | Admitting: Physical Therapy

## 2022-12-27 ENCOUNTER — Encounter: Payer: Self-pay | Admitting: Physical Therapy

## 2022-12-27 ENCOUNTER — Ambulatory Visit: Payer: Self-pay | Admitting: Surgery

## 2022-12-27 ENCOUNTER — Encounter: Payer: Self-pay | Admitting: Genetic Counselor

## 2022-12-27 ENCOUNTER — Inpatient Hospital Stay: Payer: BC Managed Care – PPO | Attending: Hematology and Oncology

## 2022-12-27 ENCOUNTER — Other Ambulatory Visit: Payer: Self-pay

## 2022-12-27 ENCOUNTER — Encounter: Payer: Self-pay | Admitting: General Practice

## 2022-12-27 ENCOUNTER — Ambulatory Visit
Admission: RE | Admit: 2022-12-27 | Discharge: 2022-12-27 | Disposition: A | Discharge status: Home / Self Care | Payer: BC Managed Care – PPO | Source: Ambulatory Visit | Attending: Radiation Oncology | Admitting: Radiation Oncology

## 2022-12-27 ENCOUNTER — Encounter: Payer: Self-pay | Admitting: Hematology and Oncology

## 2022-12-27 ENCOUNTER — Encounter: Payer: Self-pay | Admitting: *Deleted

## 2022-12-27 VITALS — BP 144/97 | HR 114 | Temp 97.9°F | Resp 18 | Ht 66.0 in | Wt 290.7 lb

## 2022-12-27 DIAGNOSIS — C50411 Malignant neoplasm of upper-outer quadrant of right female breast: Secondary | ICD-10-CM | POA: Insufficient documentation

## 2022-12-27 DIAGNOSIS — Z800 Family history of malignant neoplasm of digestive organs: Secondary | ICD-10-CM

## 2022-12-27 DIAGNOSIS — Z5111 Encounter for antineoplastic chemotherapy: Secondary | ICD-10-CM | POA: Diagnosis not present

## 2022-12-27 DIAGNOSIS — Z171 Estrogen receptor negative status [ER-]: Secondary | ICD-10-CM | POA: Insufficient documentation

## 2022-12-27 DIAGNOSIS — Z5112 Encounter for antineoplastic immunotherapy: Secondary | ICD-10-CM | POA: Insufficient documentation

## 2022-12-27 DIAGNOSIS — Z5189 Encounter for other specified aftercare: Secondary | ICD-10-CM | POA: Diagnosis not present

## 2022-12-27 DIAGNOSIS — R293 Abnormal posture: Secondary | ICD-10-CM

## 2022-12-27 DIAGNOSIS — Z803 Family history of malignant neoplasm of breast: Secondary | ICD-10-CM

## 2022-12-27 LAB — CMP (CANCER CENTER ONLY)
ALT: 28 U/L (ref 0–44)
AST: 16 U/L (ref 15–41)
Albumin: 3.8 g/dL (ref 3.5–5.0)
Alkaline Phosphatase: 78 U/L (ref 38–126)
Anion gap: 5 (ref 5–15)
BUN: 9 mg/dL (ref 6–20)
CO2: 26 mmol/L (ref 22–32)
Calcium: 9.2 mg/dL (ref 8.9–10.3)
Chloride: 106 mmol/L (ref 98–111)
Creatinine: 0.81 mg/dL (ref 0.44–1.00)
GFR, Estimated: >60 mL/min (ref >60)
Glucose, Bld: 98 mg/dL (ref 70–99)
Potassium: 3.7 mmol/L (ref 3.5–5.1)
Sodium: 137 mmol/L (ref 135–145)
Total Bilirubin: 0.3 mg/dL (ref <1.2)
Total Protein: 7.4 g/dL (ref 6.5–8.1)

## 2022-12-27 LAB — CBC WITH DIFFERENTIAL (CANCER CENTER ONLY)
Abs Immature Granulocytes: 0.01 10*3/uL (ref 0.00–0.07)
Basophils Absolute: 0 10*3/uL (ref 0.0–0.1)
Basophils Relative: 1 %
Eosinophils Absolute: 0.1 10*3/uL (ref 0.0–0.5)
Eosinophils Relative: 3 %
HCT: 33.1 % — ABNORMAL LOW (ref 36.0–46.0)
Hemoglobin: 10 g/dL — ABNORMAL LOW (ref 12.0–15.0)
Immature Granulocytes: 0 %
Lymphocytes Relative: 35 %
Lymphs Abs: 1.4 10*3/uL (ref 0.7–4.0)
MCH: 21.6 pg — ABNORMAL LOW (ref 26.0–34.0)
MCHC: 30.2 g/dL (ref 30.0–36.0)
MCV: 71.3 fL — ABNORMAL LOW (ref 80.0–100.0)
Monocytes Absolute: 0.5 10*3/uL (ref 0.1–1.0)
Monocytes Relative: 13 %
Neutro Abs: 1.8 10*3/uL (ref 1.7–7.7)
Neutrophils Relative %: 48 %
Platelet Count: 267 10*3/uL (ref 150–400)
RBC: 4.64 MIL/uL (ref 3.87–5.11)
RDW: 17.5 % — ABNORMAL HIGH (ref 11.5–15.5)
WBC Count: 3.8 10*3/uL — ABNORMAL LOW (ref 4.0–10.5)
nRBC: 0 % (ref 0.0–0.2)

## 2022-12-27 LAB — GENETIC SCREENING ORDER

## 2022-12-27 MED ORDER — PROCHLORPERAZINE MALEATE 10 MG PO TABS: 10.0000 mg | ORAL_TABLET | Freq: Four times a day (QID) | ORAL | 1 refills | Status: AC | PRN

## 2022-12-27 MED ORDER — DEXAMETHASONE 4 MG PO TABS: ORAL_TABLET | ORAL | 1 refills | Status: DC

## 2022-12-27 MED ORDER — ONDANSETRON HCL 8 MG PO TABS: 8.0000 mg | ORAL_TABLET | Freq: Three times a day (TID) | ORAL | 1 refills | Status: AC | PRN

## 2022-12-27 MED ORDER — LIDOCAINE-PRILOCAINE 2.5-2.5 % EX CREA: TOPICAL_CREAM | CUTANEOUS | 3 refills | Status: AC

## 2022-12-27 NOTE — Assessment & Plan Note (Signed)
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 After reviewing the pathology in detail, we proceeded to discuss the different treatment options between surgery, radiation, chemotherapy, antiestrogen therapies.  Treatment plan: Neoadjuvant chemotherapy with TCH Perjeta followed by maintenance therapy based on pathologic response Breast conserving surgery with targeted node dissection Adjuvant radiation therapy  Chemotherapy Counseling: I discussed the risks and benefits of chemotherapy including the risks of nausea/ vomiting, risk of infection from low WBC count, fatigue due to chemo or anemia, bruising or bleeding due to low platelets, mouth sores, loss/ change in taste and decreased appetite. Liver and kidney function will be monitored through out chemotherapy as abnormalities in liver and kidney function may be a side effect of treatment. Cardiac dysfunction due to   Herceptin and Perjeta and neuropathy risk from Taxotere were discussed in detail. Risk of permanent bone marrow dysfunction and leukemia due to chemo were also discussed.  Echo, port, chemo class, start chemotherapy in 2 weeks Recommend participation in ice compress study

## 2022-12-27 NOTE — Progress Notes (Signed)
REFERRING PROVIDER: Serena Croissant, MD [address] [place],  [place] [address]  PRIMARY PROVIDER:  [name], MD  PRIMARY REASON FOR VISIT:  1. Malignant neoplasm of upper-outer quadrant of right breast in female, estrogen receptor negative (HCC)   2. Family history of breast cancer   3. Family history of colon cancer     HISTORY OF PRESENT ILLNESS:   Kara Webb, a 42 y.o. female, was seen for a [facility] cancer genetics consultation at the request of Dr. Pamelia Hoit due to a personal and family history of breast cancer.  Kara Webb presents to clinic today to discuss the possibility of a hereditary predisposition to cancer, to discuss genetic testing, and to further clarify her future cancer risks, as well as potential cancer risks for family members.   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 The preliminary treatment plan includes neoadjuvant chemotherapy, breast conserving surgery, and adjuvant radiation.     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%, HER2 3+ positive, 1 axillary lymph node with cortical thickening biopsy: Positive   12/27/2022 Cancer Staging   Staging form: Breast, AJCC 8th Edition - Clinical: Stage IIA (cT1c, cN1, cM0, G2, ER-, PR-, HER2+) - Signed by Serena Croissant, MD on 12/27/2022 Stage prefix: Initial diagnosis Histologic grading system: 3 grade system      Past Medical History:  Diagnosis Date   Breast cancer (HCC)    BV (bacterial vaginosis)    Cold intolerance 01/16/2005   Fatigue 01/16/2005   H/O chlamydia infection 08/12/2010   H/O urinary frequency 11/16/2000   H/O  vaginal discharge 11/17/2003   H/O varicella    Heart murmur    Born with murmur   HSV-1 infection    Hx: UTI (urinary tract infection)    Pelvic pain    Vulvovaginal candidiasis 04/16/2005   Yeast infection     Past Surgical History:  Procedure Laterality Date   BREAST BIOPSY Right 12/18/2022   [place] RT BREAST BX W LOC DEV 1ST LESION IMG BX SPEC US GUIDE 12/18/2022 [facility] MAMMOGRAPHY   TOE SURGERY  2004   WISDOM TOOTH EXTRACTION  2003    FAMILY HISTORY:  We obtained a detailed, 4-generation family history.  Significant diagnoses are listed below: Family History  Problem Relation Age of Onset   Breast cancer Maternal Aunt 67   Colon cancer Maternal Aunt 50   Colon cancer Maternal Uncle 2   Breast cancer Paternal Grandmother        dx mid 59s-70s    Kara Webb is unaware of previous family history of genetic testing for hereditary cancer risks. There is no reported Ashkenazi Jewish ancestry. There is no known consanguinity.  GENETIC COUNSELING ASSESSMENT: Kara Webb is a 42 y.o. female with a personal and family history which is somewhat suggestive of a hereditary cancer syndrome and predisposition to cancer given her age of diagnosis of breast cancer and the presence of breast cancer in other generations of the family. We, therefore, discussed and recommended the following at today's visit.   DISCUSSION: We discussed that 5 - 10% of cancer is hereditary.  Most cases of hereditary breast cancer are associated  with mutations in BRCA1/2.  There are other genes that can be associated with hereditary breast or colon cancer syndromes.  We discussed that testing is beneficial for several reasons including knowing how to follow individuals for their cancer risks, identifying whether potential treatment/surgery options would be beneficial, and understanding if other family members could be at risk for cancer and allowing them to undergo genetic testing.   We reviewed the characteristics,  features and inheritance patterns of hereditary cancer syndromes. We also discussed genetic testing, including the appropriate family members to test, the process of testing, insurance coverage and turn-around-time for results. We discussed the implications of a negative, positive, carrier and/or variant of uncertain significant result. We recommended Kara Webb pursue genetic testing for a panel that includes genes associated with breast, colon, and other cancers.   The [facility] CancerNext+RNAinsight Panel includes sequencing, rearrangement analysis, and RNA analysis for the following 39 genes: APC, ATM, BAP1, BARD1, BMPR1A, BRCA1, BRCA2, BRIP1, CDH1, CDKN2A, CHEK2, FH, FLCN, MET, MLH1, MSH2, MSH6, MUTYH, NF1, NTHL1, PALB2, PMS2, PTEN, RAD51C, RAD51D, SMAD4, STK11, TP53, TSC1, TSC2, and VHL (sequencing and deletion/duplication); AXIN2, HOXB13, MBD4, MSH3, POLD1 and POLE (sequencing only); EPCAM and GREM1 (deletion/duplication only).   Based on Kara Webb's personal history of breast cancer at age 35, she meets medical criteria for genetic testing. Despite that she meets criteria, she may still have an out of pocket cost. We discussed that if her out of pocket cost for testing is over $100, the laboratory should contact her and discuss the self-pay prices and/or patient pay assistance programs.     PLAN: After considering the risks, benefits, and limitations, Kara Webb provided informed consent to pursue genetic testing and the blood sample was sent to [facility] for analysis of the CancerNext+RNAinsight Panel. Results should be available within approximately 3 weeks, at which point they will be disclosed by telephone to Kara Webb, as will any additional recommendations warranted by these results. Kara Webb will receive a summary of her genetic counseling visit and a copy of her results once available. This information will also be available in Epic.   Based on Kara Webb's family history, we  recommended her maternal aunt, who was diagnosed with colon cancer at age 40, have genetic counseling and testing. Kara Webb can let us know if we can be of any assistance in coordinating genetic counseling and/or testing for this family member.   Ms. Downard questions were answered to her satisfaction today. Our contact information was provided should additional questions or concerns arise. Thank you for the referral and allowing [place] to share in the care of your patient.   Chemika Nightengale M. Rennie Plowman, MS, [facility] Genetic Counselor [email or web address] (P) [phone]  The patient was seen for a total of 16 minutes in face-to-face genetic counseling.  The patient was accompanied by her husband and parents.  Dr. Pamelia Hoit was available to discuss this case as needed.    _______________________________________________________________________ For Office Staff:  Number of people involved in session: 4 Was an Intern/ student involved with case: no

## 2022-12-27 NOTE — Addendum Note (Signed)
Addended by: Serena Croissant on: 12/27/2022 08:21 PM   Modules accepted: Orders

## 2022-12-27 NOTE — Progress Notes (Signed)
START ON PATHWAY REGIMEN - Breast     Cycle 1: A cycle is 21 days:     Pertuzumab      Trastuzumab-xxxx      Docetaxel      Carboplatin    Cycles 2 through 6: A cycle is every 21 days:     Pertuzumab      Trastuzumab-xxxx      Docetaxel      Carboplatin   **Always confirm dose/schedule in your pharmacy ordering system**  Patient Characteristics: Preoperative or Nonsurgical Candidate, M0 (Clinical Staging), Up to cT4c, Any N, M0, Neoadjuvant Therapy followed by Surgery, Invasive Disease, Chemotherapy, HER2 Positive, ER Negative Therapeutic Status: Preoperative or Nonsurgical Candidate, M0 (Clinical Staging) AJCC M Category: cM0 AJCC Grade: G3 ER Status: Negative (-) AJCC 8 Stage Grouping: IIA HER2 Status: Positive (+) AJCC T Category: cT1b AJCC N Category: cN1 PR Status: Negative (-) Breast Surgical Plan: Neoadjuvant Therapy followed by Surgery Intent of Therapy: Curative Intent, Discussed with Patient

## 2022-12-27 NOTE — Therapy (Signed)
OUTPATIENT PHYSICAL THERAPY BREAST CANCER BASELINE EVALUATION   Patient Name: Kara Webb MRN: [id number] DOB:30-Oct-1980, 42 y.o., female Today's Date: 12/27/2022  END OF SESSION:  PT End of Session - 12/27/22 1738     Visit Number 1    Number of Visits 2    Date for PT Re-Evaluation 06/27/23    PT Start Time 1502    PT Stop Time 1530    PT Time Calculation (min) 28 min    Activity Tolerance Patient tolerated treatment well    Behavior During Therapy [facility] for tasks assessed/performed             Past Medical History:  Diagnosis Date   Breast cancer (HCC)    BV (bacterial vaginosis)    Cold intolerance 01/16/2005   Fatigue 01/16/2005   H/O chlamydia infection 08/12/2010   H/O urinary frequency 11/16/2000   H/O vaginal discharge 11/17/2003   H/O varicella    Heart murmur    Born with murmur   HSV-1 infection    Hx: UTI (urinary tract infection)    Pelvic pain    Vulvovaginal candidiasis 04/16/2005   Yeast infection    Past Surgical History:  Procedure Laterality Date   BREAST BIOPSY Right 12/18/2022   [place] RT BREAST BX W LOC DEV 1ST LESION IMG BX SPEC US GUIDE 12/18/2022 [facility]AMMOGRAP[facility]   TOE SURGERY  2004   WISDOM TOOTH EXTRACTION  2003   Patient Active Problem List   Diagnosis Date Noted   Family history of breast cancer 12/27/2022   Family history of colon cancer 12/27/2022   Malignant neoplasm of upper-outer quadrant of right breast in female, estrogen receptor negative (HCC) 12/25/2022   Bacterial vaginosis 01/25/2011    REFERRING PROVIDER: Dr. Harriette Bouillon  REFERRING DIAG: Right breast cancer  THERAPY DIAG:  Malignant neoplasm of upper-outer quadrant of right breast in female, estrogen receptor negative (HCC)  Abnormal posture  Rationale for Evaluation and Treatment: Rehabilitation  ONSET DATE: 12/12/2022  SUBJECTIVE:                                                                                                                                                                                            SUBJECTIVE STATEMENT: Patient reports she is here today to be seen by her medical team for her newly diagnosed right breast cancer.   PERTINENT HISTORY:  Patient was diagnosed on 12/12/2022 with right grade 2 invasive ductal carcinoma breast cancer. It measures 1.3 cm and is located in the upper outer quadrant. It is ER/PR negative and HER2 positive with a Ki67 of 30%. She has a positive axillary  lymph node.  PATIENT GOALS:   reduce lymphedema risk and learn post op HEP.   PAIN:  Are you having pain? No  PRECAUTIONS: Active CA   RED FLAGS: None   HAND DOMINANCE: right  WEIGHT BEARING RESTRICTIONS: No  FALLS:  Has patient fallen in last 6 months? No  LIVING ENVIRONMENT: Patient lives with: her husband and sometimes her 40 y.o. son who is in college at [employer] Lives in: House/apartment Has following equipment at home: None  OCCUPATION: Adjustor for an insurance company  LEISURE: She does not exercise  PRIOR LEVEL OF FUNCTION: Independent   OBJECTIVE: Note: Objective measures were completed at Evaluation unless otherwise noted.  COGNITION: Overall cognitive status: Within functional limits for tasks assessed    POSTURE:  Forward head and rounded shoulders posture  UPPER EXTREMITY AROM/PROM:  A/PROM RIGHT   eval   Shoulder extension 58  Shoulder flexion 154  Shoulder abduction 144  Shoulder internal rotation 74  Shoulder external rotation 88    (Blank rows = not tested)  A/PROM LEFT   eval  Shoulder extension 57  Shoulder flexion 138  Shoulder abduction 152  Shoulder internal rotation 73  Shoulder external rotation 88    (Blank rows = not tested)  CERVICAL AROM: All within normal limits  UPPER EXTREMITY STRENGTH: WNL  LYMPHEDEMA ASSESSMENTS (in cm):   LANDMARK RIGHT   eval  10 cm proximal to olecranon process 38.4  Olecranon process 30.9  10 cm proximal to ulnar styloid process 28.5   Just proximal to ulnar styloid process 18.1  Across hand at thumb web space 19.9  At base of 2nd digit 7  (Blank rows = not tested)  LANDMARK LEFT   eval  10 cm proximal to olecranon process 37.7  Olecranon process 29.9  10 cm proximal to ulnar styloid process 28.5  Just proximal to ulnar styloid process 19  Across hand at thumb web space 21  At base of 2nd digit 7  (Blank rows = not tested)  L-DEX LYMPHEDEMA SCREENING:  The patient was assessed using the L-Dex machine today to produce a lymphedema index baseline score. The patient will be reassessed on a regular basis (typically every 3 months) to obtain new L-Dex scores. If the score is > 6.5 points away from his/her baseline score indicating onset of subclinical lymphedema, it will be recommended to wear a compression garment for 4 weeks, 12 hours per day and then be reassessed. If the score continues to be > 6.5 points from baseline at reassessment, we will initiate lymphedema treatment. Assessing in this manner has a 95% rate of preventing clinically significant lymphedema.   L-DEX FLOWSHEETS - 12/27/22 1700       L-DEX LYMPHEDEMA SCREENING   Measurement Type Unilateral    L-DEX MEASUREMENT EXTREMITY Upper Extremity    POSITION  Standing    DOMINANT SIDE Right    At Risk Side Right    BASELINE SCORE (UNILATERAL) -9.5             QUICK DASH SURVEY:  Neldon Mc - 12/27/22 0001     Open a tight or new jar No difficulty    Do heavy household chores (wash walls, wash floors) No difficulty    Carry a shopping bag or briefcase No difficulty    Wash your back No difficulty    Use a knife to cut food No difficulty    Recreational activities in which you take some force or impact through your arm, shoulder,  or hand (golf, hammering, tennis) No difficulty    During the past week, to what extent has your arm, shoulder or hand problem interfered with your normal social activities with family, friends, neighbors, or groups? Not  at all    During the past week, to what extent has your arm, shoulder or hand problem limited your work or other regular daily activities Not at all    Arm, shoulder, or hand pain. None    Tingling (pins and needles) in your arm, shoulder, or hand None    Difficulty Sleeping No difficulty    DASH Score 0 %              PATIENT EDUCATION:  Education details: Lymphedema risk reduction and post op shoulder/posture HEP Person educated: Patient Education method: Explanation, Demonstration, Handout Education comprehension: Patient verbalized understanding and returned demonstration  HOME EXERCISE PROGRAM: Patient was instructed today in a home exercise program today for post op shoulder range of motion. These included active assist shoulder flexion in sitting, scapular retraction, wall walking with shoulder abduction, and hands behind head external rotation.  She was encouraged to do these twice a day, holding 3 seconds and repeating 5 times when permitted by her physician.   ASSESSMENT:  CLINICAL IMPRESSION: Patient was diagnosed on 12/12/2022 with right grade 2 invasive ductal carcinoma breast cancer. It measures 1.3 cm and is located in the upper outer quadrant. It is ER/PR negative and HER2 positive with a Ki67 of 30%. She has a positive axillary lymph node.Her multidisciplinary medical team met prior to her assessments to determine a recommended treatment plan. She is planning to have neoadjuvant chemotherapy followed by a right lumpectomy and targeted axillary node dissection and radiation. She will benefit from a post op PT reassessment to determine needs and from L-Dex screens every 3 months for 2 years to detect subclinical lymphedema.  Pt will benefit from skilled therapeutic intervention to improve on the following deficits: Decreased knowledge of precautions, impaired UE functional use, pain, decreased ROM, postural dysfunction.   PT treatment/interventions: ADL/self-care home  management, pt/family education, therapeutic exercise  REHAB POTENTIAL: Excellent  CLINICAL DECISION MAKING: Stable/uncomplicated  EVALUATION COMPLEXITY: Low   GOALS: Goals reviewed with patient? YES  LONG TERM GOALS: (STG=LTG)    Name Target Date Goal status  1 Pt will be able to verbalize understanding of pertinent lymphedema risk reduction practices relevant to her dx specifically related to skin care.  Baseline:  No knowledge 12/27/2022 Achieved at eval  2 Pt will be able to return demo and/or verbalize understanding of the post op HEP related to regaining shoulder ROM. Baseline:  No knowledge 12/27/2022 Achieved at eval  3 Pt will be able to verbalize understanding of the importance of attending the post op After Breast CA Class for further lymphedema risk reduction education and therapeutic exercise.  Baseline:  No knowledge 12/27/2022 Achieved at eval  4 Pt will demo she has regained full shoulder ROM and function post operatively compared to baselines.  Baseline: See objective measurements taken today. 06/27/2023     PLAN:  PT FREQUENCY/DURATION: EVAL and 1 follow up appointment.   PLAN FOR NEXT SESSION: will reassess 3-4 weeks post op to determine needs.   Patient will follow up at outpatient cancer rehab 3-4 weeks following surgery.  If the patient requires physical therapy at that time, a specific plan will be dictated and sent to the referring physician for approval. The patient was educated today on appropriate basic range of motion  exercises to begin post operatively and the importance of attending the After Breast Cancer class following surgery.  Patient was educated today on lymphedema risk reduction practices as it pertains to recommendations that will benefit the patient immediately following surgery.  She verbalized good understanding.    Physical Therapy Information for After Breast Cancer Surgery/Treatment:  Lymphedema is a swelling condition that you may be at  risk for in your arm if you have lymph nodes removed from the armpit area.  After a sentinel node biopsy, the risk is approximately 5-9% and is higher after an axillary node dissection.  There is treatment available for this condition and it is not life-threatening.  Contact your physician or physical therapist with concerns. You may begin the 4 shoulder/posture exercises (see additional sheet) when permitted by your physician (typically a week after surgery).  If you have drains, you may need to wait until those are removed before beginning range of motion exercises.  A general recommendation is to not lift your arms above shoulder height until drains are removed.  These exercises should be done to your tolerance and gently.  This is not a "no pain/no gain" type of recovery so listen to your body and stretch into the range of motion that you can tolerate, stopping if you have pain.  If you are having immediate reconstruction, ask your plastic surgeon about doing exercises as he or she may want you to wait. We encourage you to attend the free one time ABC (After Breast Cancer) class offered by [facility].  You will learn information related to lymphedema risk, prevention and treatment and additional exercises to regain mobility following surgery.  You can call [phone] for more information.  This is offered the 1st and 3rd Monday of each month.  You only attend the class one time. While undergoing any medical procedure or treatment, try to avoid blood pressure being taken or needle sticks from occurring on the arm on the side of cancer.   This recommendation begins after surgery and continues for the rest of your life.  This may help reduce your risk of getting lymphedema (swelling in your arm). An excellent resource for those seeking information on lymphedema is the National Lymphedema Network's web site. It can be accessed at www.lymphnet.org If you notice swelling in your hand,  arm or breast at any time following surgery (even if it is many years from now), please contact your doctor or physical therapist to discuss this.  Lymphedema can be treated at any time but it is easier for you if it is treated early on.  If you feel like your shoulder motion is not returning to normal in a reasonable amount of time, please contact your surgeon or physical therapist.  [facility] [phone]. [address], [address], [place] [address]  ABC CLASS After Breast Cancer Class  After Breast Cancer Class is a specially designed exercise class to assist you in a safe recover after having breast cancer surgery.  In this class you will learn how to get back to full function whether your drains were just removed or if you had surgery a month ago.  This one-time class is held the 1st and 3rd Monday of every month from 11:00 a.m. until 12:00 noon virtually.  This class is FREE and space is limited. For more information or to register for the next available class, call [phone].  Class Goals  Understand specific stretches to  improve the flexibility of you chest and shoulder. Learn ways to safely strengthen your upper body and improve your posture. Understand the warning signs of infection and why you may be at risk for an arm infection. Learn about Lymphedema and prevention.  ** You do not attend this class until after surgery.  Drains must be removed to participate  Patient was instructed today in a home exercise program today for post op shoulder range of motion. These included active assist shoulder flexion in sitting, scapular retraction, wall walking with shoulder abduction, and hands behind head external rotation.  She was encouraged to do these twice a day, holding 3 seconds and repeating 5 times when permitted by her physician.  Bethann Punches, [place] 12/27/22 9:36 PM

## 2022-12-27 NOTE — Progress Notes (Signed)
[facility] CONSULT NOTE  Patient Care Team: Kara Able, MD as PCP - General (Family Medicine) Kara Angelica, RN as Oncology Nurse Navigator Kara Proud, RN as Oncology Nurse Navigator Kara Blackbird, MD as Consulting Physician (Radiation Oncology) Kara Croissant, MD as Consulting Physician (Hematology and Oncology) Kara Bouillon, MD as Consulting Physician (General Surgery)  CHIEF COMPLAINTS/PURPOSE OF CONSULTATION:  Newly diagnosed breast cancer  HISTORY OF PRESENTING ILLNESS:  Kara Webb is a 42 year old with a recent diagnosis of breast cancer.  She felt a right breast lump which was evaluated by mammogram and ultrasound.  The ultrasound revealed that it was a 1.3 cm mass and ultrasound-guided biopsy revealed that it was a grade 2 invasive ductal carcinoma that is ER/PR negative HER2 3+ positive.  She also had an enlarged lymph node which was biopsy-proven to be malignancy.  She was presented this morning to the multidisciplinary tumor board and she is here today accompanied by her family to discuss her treatment plan.   I reviewed her records extensively and collaborated the history with the patient.  SUMMARY OF ONCOLOGIC HISTORY: Oncology History  Malignant neoplasm of upper-outer quadrant of right breast in female, estrogen receptor negative (HCC)  12/18/2022 Initial Diagnosis   Palpable right breast lump (irregular density) by ultrasound 1.3 cm at 9:30 position, ultrasound-guided biopsy: Grade 2 IDC no LVI, ER 0%, PR 0%, Ki67 30%, HER2 3+ positive, 1 axillary lymph node with cortical thickening biopsy: Positive   12/27/2022 Cancer Staging   Staging form: Breast, AJCC 8th Edition - Clinical: Stage IIA (cT1c, cN1, cM0, G2, ER-, PR-, HER2+) - Signed by Kara Croissant, MD on 12/27/2022 Stage prefix: Initial diagnosis Histologic grading system: 3 grade system      MEDICAL HISTORY:  Past Medical History:  Diagnosis Date   Breast cancer (HCC)    BV  (bacterial vaginosis)    Cold intolerance 01/16/2005   Fatigue 01/16/2005   H/O chlamydia infection 08/12/2010   H/O urinary frequency 11/16/2000   H/O vaginal discharge 11/17/2003   H/O varicella    Heart murmur    Born with murmur   HSV-1 infection    Hx: UTI (urinary tract infection)    Pelvic pain    Vulvovaginal candidiasis 04/16/2005   Yeast infection     SURGICAL HISTORY: Past Surgical History:  Procedure Laterality Date   BREAST BIOPSY Right 12/18/2022   [place] RT BREAST BX W LOC DEV 1ST LESION IMG BX SPEC US GUIDE 12/18/2022 [facility]AMMOGRAPHY   TOE SURGERY  2004   WISDOM TOOTH EXTRACTION  2003    SOCIAL HISTORY: Social History   Socioeconomic History   Marital status: Single    Spouse name: Not on file   Number of children: Not on file   Years of education: Not on file   Highest education level: Not on file  Occupational History   Not on file  Tobacco Use   Smoking status: Never   Smokeless tobacco: Never  Substance and Sexual Activity   Alcohol use: No   Drug use: No   Sexual activity: Yes    Birth control/protection: None  Other Topics Concern   Not on file  Social History Narrative   Not on file   Social Determinants of Health   Financial Resource Strain: Not on file  Food Insecurity: No Food Insecurity (12/27/2022)   Hunger Vital Sign    Worried About Running Out of Food in the Last Year: Never true    Ran Out  of Food in the Last Year: Never true  Transportation Needs: No Transportation Needs (12/27/2022)   PRAPARE - Administrator, Civil Service (Medical): No    Lack of Transportation (Non-Medical): No  Physical Activity: Not on file  Stress: Not on file  Social Connections: Not on file  Intimate Partner Violence: Not At Risk (12/27/2022)   Humiliation, Afraid, Rape, and Kick questionnaire    Fear of Current or Ex-Partner: No    Emotionally Abused: No    Physically Abused: No    Sexually Abused: No    FAMILY HISTORY: Family  History  Problem Relation Age of Onset   Breast cancer Maternal Aunt 67   Colon cancer Maternal Aunt 50   Colon cancer Maternal Uncle 65   Hypertension Maternal Grandmother    Diabetes Maternal Grandmother    Hypertension Maternal Grandfather    Breast cancer Paternal Grandmother        dx mid 5s-70s    ALLERGIES:  has No Known Allergies.  MEDICATIONS:  Current Outpatient Medications  Medication Sig Dispense Refill   ALPRAZolam (NIRAVAM) 0.5 MG dissolvable tablet Take 0.5 mg by mouth at bedtime as needed for anxiety.     AMBULATORY NON FORMULARY MEDICATION Place 600 mg vaginally 2 (two) times a week. Medication Name: Boric Acid Capsules 600 mg 1 pv twice a week prn 30 suppository 11   cetirizine (ZYRTEC) 10 MG chewable tablet Zyrtec     IUD'S IU IUD's     No current facility-administered medications for this visit.    REVIEW OF SYSTEMS:   Constitutional: Denies fevers, chills or abnormal night sweats All other systems were reviewed with the patient and are negative.  PHYSICAL EXAMINATION: ECOG PERFORMANCE STATUS: 1 - Symptomatic but completely ambulatory  Vitals:   12/27/22 1246  BP: (!) 144/97  Pulse: (!) 114  Resp: 18  Temp: 97.9 F (36.6 C)  SpO2: 100%   Filed Weights   12/27/22 1246  Weight: 290 lb 11.2 oz (131.9 kg)    GENERAL:alert, no distress and comfortable  LABORATORY DATA:  I have reviewed the data as listed Lab Results  Component Value Date   WBC 3.8 (L) 12/27/2022   HGB 10.0 (L) 12/27/2022   HCT 33.1 (L) 12/27/2022   MCV 71.3 (L) 12/27/2022   PLT 267 12/27/2022   Lab Results  Component Value Date   NA 137 12/27/2022   K 3.7 12/27/2022   CL 106 12/27/2022   CO2 26 12/27/2022    RADIOGRAPHIC STUDIES: I have personally reviewed the radiological reports and agreed with the findings in the report.  ASSESSMENT AND PLAN:  Malignant neoplasm of upper-outer quadrant of right breast in female, estrogen receptor negative  (HCC) 12/18/2022:Palpable right breast lump (irregular density) by ultrasound 1.3 cm at 9:30 position, ultrasound-guided biopsy: Grade 2 IDC no LVI, ER 0%, PR 0%, Ki67 30%, HER2 3+ positive, 1 axillary lymph node with cortical thickening biopsy: Positive  Pathology and radiology counseling: Discussed with the patient, the details of pathology including the type of breast cancer,the clinical staging, the significance of ER, PR and HER-2/neu receptors and the implications for treatment. After reviewing the pathology in detail, we proceeded to discuss the different treatment options between surgery, radiation, chemotherapy, antiestrogen therapies.  Treatment plan: Neoadjuvant chemotherapy with [facility] Perjeta followed by maintenance therapy based on pathologic response Breast conserving surgery with targeted node dissection Adjuvant radiation therapy  Chemotherapy Counseling: I discussed the risks and benefits of chemotherapy including the risks of  nausea/ vomiting, risk of infection from low WBC count, fatigue due to chemo or anemia, bruising or bleeding due to low platelets, mouth sores, loss/ change in taste and decreased appetite. Liver and kidney function will be monitored through out chemotherapy as abnormalities in liver and kidney function may be a side effect of treatment. Cardiac dysfunction due to   Herceptin and Perjeta and neuropathy risk from Taxotere were discussed in detail. Risk of permanent bone marrow dysfunction and leukemia due to chemo were also discussed.  Echo, port, chemo class, start chemotherapy in 2 weeks Recommend participation in ice compress study  All questions were answered. The patient knows to call the clinic with any problems, questions or concerns.    Tamsen Meek, MD 12/27/22

## 2022-12-27 NOTE — Progress Notes (Signed)
Radiation Oncology         [phone] ________________________________  Multidisciplinary Breast Oncology Clinic [facility]) Initial Outpatient Consultation  Name: Kara Webb MRN: [id number]  Date: 12/27/2022  DOB: February 12, 1980  JJ:OACZY, Jocelyn Lamer, MD  Harriette Bouillon, MD   REFERRING PHYSICIAN: Harriette Bouillon, MD  DIAGNOSIS: The encounter diagnosis was Malignant neoplasm of upper-outer quadrant of right breast in female, estrogen receptor negative (HCC).  Stage IIA (cT1c, cN1, cM0) Right Breast UOQ, Invasive Ductal Carcinoma, ER- / PR- / Her2+, Grade 2    ICD-10-CM   1. Malignant neoplasm of upper-outer quadrant of right breast in female, estrogen receptor negative (HCC)  C50.411    Z17.1       HISTORY OF PRESENT ILLNESS::Kara Webb is a 42 y.o. female who is presenting to the office today for evaluation of her newly diagnosed breast cancer. She is accompanied by her husband, mother, and father. She is doing well overall.   She presented with a palpable lump in the 3 o'clock right breast in the upper outer breast. She accordingly underwent bilateral diagnostic mammography with tomography and right breast ultrasonography [facility] on 12[facility]12[facility]22 showing: an indeterminate 1.3 cm mass in the 9:30 o'clock right breast, 9 cmfn, and a solitary abnormal right axillary lymph node with focal cortical thickening measuring up to 6 mm. .  Biopsy of the 9:30 o'clock right breast on 12/18/22 showed: grade 2 invasive ductal carcinoma measuring 3 mm in the greatest linear extent of the sample. Prognostic indicators significant for: estrogen receptor, 0% negative and progesterone receptor, 9% negative. Proliferation marker Ki67 at 30%. HER2 positive. 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   Menarche: 42 years old Age at first live birth: 42 years old GP: 1 LMP: 12/16/22 Contraceptive: never used HRT: did not  indicate use on the provided form   The patient was referred today for presentation in the multidisciplinary conference.  Radiology studies and pathology slides were presented there for review and discussion of treatment options.  A consensus was discussed regarding potential next steps.  PREVIOUS RADIATION THERAPY: No  PAST MEDICAL HISTORY:  Past Medical History:  Diagnosis Date   Breast cancer (HCC)    BV (bacterial vaginosis)    Cold intolerance 01/16/2005   Fatigue 01/16/2005   H/O chlamydia infection 08/12/2010   H/O urinary frequency 11/16/2000   H/O vaginal discharge 11/17/2003   H/O varicella    Heart murmur    Born with murmur   HSV-1 infection    Hx: UTI (urinary tract infection)    Pelvic pain    Vulvovaginal candidiasis 04/16/2005   Yeast infection     PAST SURGICAL HISTORY: Past Surgical History:  Procedure Laterality Date   BREAST BIOPSY Right 12/18/2022   [place] RT BREAST BX W LOC DEV 1ST LESION IMG BX SPEC US GUIDE 12/18/2022 [facility] MAMMOGRAPHY   TOE SURGERY  2004   WISDOM TOOTH EXTRACTION  2003    FAMILY HISTORY:  Family History  Problem Relation Age of Onset   Breast cancer Maternal Aunt    Hypertension Maternal Grandmother    Diabetes Maternal Grandmother    Hypertension Maternal Grandfather    Breast cancer Paternal Grandmother     SOCIAL HISTORY:  Social History   Socioeconomic History   Marital status: Single    Spouse name: Not on file   Number of children: Not on file   Years of education: Not on file  Highest education level: Not on file  Occupational History   Not on file  Tobacco Use   Smoking status: Never   Smokeless tobacco: Never  Substance and Sexual Activity   Alcohol use: No   Drug use: No   Sexual activity: Yes    Birth control/protection: None  Other Topics Concern   Not on file  Social History Narrative   Not on file   Social Determinants of Health   Financial Resource Strain: Not on file  Food Insecurity: No Food  Insecurity (12/27/2022)   Hunger Vital Sign    Worried About Running Out of Food in the Last Year: Never true    Ran Out of Food in the Last Year: Never true  Transportation Needs: No Transportation Needs (12/27/2022)   PRAPARE - Administrator, Civil Service (Medical): No    Lack of Transportation (Non-Medical): No  Physical Activity: Not on file  Stress: Not on file  Social Connections: Not on file    ALLERGIES: No Known Allergies  MEDICATIONS:  Current Outpatient Medications  Medication Sig Dispense Refill   ALPRAZolam (NIRAVAM) 0.5 MG dissolvable tablet Take 0.5 mg by mouth at bedtime as needed for anxiety.     AMBULATORY NON FORMULARY MEDICATION Place 600 mg vaginally 2 (two) times a week. Medication Name: Boric Acid Capsules 600 mg 1 pv twice a week prn 30 suppository 11   cetirizine (ZYRTEC) 10 MG chewable tablet Zyrtec     IUD'S IU IUD's     No current facility-administered medications for this encounter.    REVIEW OF SYSTEMS: A 10+ POINT REVIEW OF SYSTEMS WAS OBTAINED including neurology, dermatology, psychiatry, cardiac, respiratory, lymph, extremities, GI, GU, musculoskeletal, constitutional, reproductive, HEENT. On the provided form, she reports loss of sleep, breast pain, and anxiety. She denies any other symptoms.    PHYSICAL EXAM:     12/27/2022  Vitals with BMI   Height 5\' 6"    Weight 290 lbs 11 oz   BMI 46.94   Systolic 144 !   Diastolic 97 (H)   Pulse 114 !     Legend: ! Abnormal (H) High  Lungs are clear to auscultation bilaterally. Heart has regular rate and rhythm. No palpable cervical, supraclavicular, or axillary adenopathy. Abdomen soft, non-tender, normal bowel sounds. Breast: Left breast with no palpable mass, nipple discharge, or bleeding. Right breast with a palpable lump in the upper outer aspect measuring approximately 1.5 cm with some associated bruising with the biopsy site.    KPS = 100  100 - Normal; no complaints; no  evidence of disease. 90   - Able to carry on normal activity; minor signs or symptoms of disease. 80   - Normal activity with effort; some signs or symptoms of disease. 51   - Cares for self; unable to carry on normal activity or to do active work. 60   - Requires occasional assistance, but is able to care for most of his personal needs. 50   - Requires considerable assistance and frequent medical care. 40   - Disabled; requires special care and assistance. 30   - Severely disabled; hospital admission is indicated although death not imminent. 20   - Very sick; hospital admission necessary; active supportive treatment necessary. 10   - Moribund; fatal processes progressing rapidly. 0     - Dead  Karnofsky DA, Abelmann WH, Craver LS and Burchenal [facility] [phone]) The use of the nitrogen mustards in the palliative treatment of carcinoma: with particular  reference to bronchogenic carcinoma Cancer 1 634-56  LABORATORY DATA:  Lab Results  Component Value Date   WBC 3.8 (L) 12/27/2022   HGB 10.0 (L) 12/27/2022   HCT 33.1 (L) 12/27/2022   MCV 71.3 (L) 12/27/2022   PLT 267 12/27/2022   Lab Results  Component Value Date   NA 137 12/27/2022   K 3.7 12/27/2022   CL 106 12/27/2022   CO2 26 12/27/2022   Lab Results  Component Value Date   ALT 28 12/27/2022   AST 16 12/27/2022   ALKPHOS 78 12/27/2022   BILITOT 0.3 12/27/2022    PULMONARY FUNCTION TEST:   Review Flowsheet        No data to display          RADIOGRAPHY: [place] AXILLARY NODE CORE BIOPSY RIGHT  Addendum Date: 12/21/2022   ADDENDUM REPORT: 12/20/2022 22:17 ADDENDUM: Pathology revealed GRADE II INVASIVE DUCTAL CARCINOMA WITH A PROMINENT LYMPHOPLASMACYTIC RESPONSE of the RIGHT breast, 9:30 o'clock, 9cmfn (coil clip). This was found to be concordant by Dr. Annia Belt. Pathology revealed METASTATIC CARCINOMA MORPHOLOGICALLY CONSISTENT WITH PART 1 of the RIGHT axilla, (hydromark clip). THERE IS FOCAL QUESTIONABLE EXTRACAPSULAR INVASION  PRESENT. This was found to be concordant by Dr. Annia Belt. Pathology results were discussed with the patient by telephone. The patient reported doing well after the biopsies with tenderness at the sites. Post biopsy instructions and care were reviewed and questions were answered. The patient was encouraged to call [facility] for any additional concerns. My direct phone number was provided. The patient was referred to [facility] on December 27, 2022. Recommendation for a bilateral breast MRI given the patient's age. Pathology results reported by Rene Kocher, RN on 12/19/2022. Electronically Signed   By: Annia Belt M.D.   On: 12/20/2022 22:17   Result Date: 12/20/2022 CLINICAL DATA:  Indeterminate right breast mass and right axillary lymph node. EXAM: ULTRASOUND GUIDED RIGHT BREAST CORE NEEDLE BIOPSY COMPARISON:  Previous exam(s). PROCEDURE: I met with the patient and we discussed the procedure of ultrasound-guided biopsy, including benefits and alternatives. We discussed the high likelihood of a successful procedure. We discussed the risks of the procedure, including infection, bleeding, tissue injury, clip migration, and inadequate sampling. Informed written consent was given. The usual time-out protocol was performed immediately prior to the procedure. Site 1: Indeterminate right breast mass 9:30 o'clock Lesion quadrant: Upper outer quadrant Using sterile technique and 1% Lidocaine as local anesthetic, under direct ultrasound visualization, a 14 gauge spring-loaded device was used to perform biopsy of right breast mass 9:30 o'clock using a lateral approach. At the conclusion of the procedure coil shaped tissue marker clip was deployed into the biopsy cavity. Follow up 2 view mammogram was performed and dictated separately. Site 2: Right axillary lymph node Lesion quadrant: Upper outer quadrant Using sterile  technique and 1% Lidocaine as local anesthetic, under direct ultrasound visualization, a 14 gauge spring-loaded device was used to perform biopsy of right axillary lymph node using a lateral approach. At the conclusion of the procedure [facility] shaped tissue marker clip was deployed into the biopsy cavity. Follow up 2 view mammogram was performed and dictated separately. IMPRESSION: Ultrasound guided biopsy of right breast mass and right axillary lymph node. No apparent complications. Electronically Signed: By: Annia Belt M.D. On: 12/18/2022 08:27   [place] RT BREAST BX W LOC DEV 1ST LESION IMG BX SPEC US GUIDE  Addendum  Date: 12/21/2022   ADDENDUM REPORT: 12/20/2022 22:17 ADDENDUM: Pathology revealed GRADE II INVASIVE DUCTAL CARCINOMA WITH A PROMINENT LYMPHOPLASMACYTIC RESPONSE of the RIGHT breast, 9:30 o'clock, 9cmfn (coil clip). This was found to be concordant by Dr. Annia Belt. Pathology revealed METASTATIC CARCINOMA MORPHOLOGICALLY CONSISTENT WITH PART 1 of the RIGHT axilla, (hydromark clip). THERE IS FOCAL QUESTIONABLE EXTRACAPSULAR INVASION PRESENT. This was found to be concordant by Dr. Annia Belt. Pathology results were discussed with the patient by telephone. The patient reported doing well after the biopsies with tenderness at the sites. Post biopsy instructions and care were reviewed and questions were answered. The patient was encouraged to call [facility] for any additional concerns. My direct phone number was provided. The patient was referred to [facility] on December 27, 2022. Recommendation for a bilateral breast MRI given the patient's age. Pathology results reported by Rene Kocher, RN on 12/19/2022. Electronically Signed   By: Annia Belt M.D.   On: 12/20/2022 22:17   Result Date: 12/20/2022 CLINICAL DATA:  Indeterminate right breast mass and right axillary lymph node. EXAM: ULTRASOUND  GUIDED RIGHT BREAST CORE NEEDLE BIOPSY COMPARISON:  Previous exam(s). PROCEDURE: I met with the patient and we discussed the procedure of ultrasound-guided biopsy, including benefits and alternatives. We discussed the high likelihood of a successful procedure. We discussed the risks of the procedure, including infection, bleeding, tissue injury, clip migration, and inadequate sampling. Informed written consent was given. The usual time-out protocol was performed immediately prior to the procedure. Site 1: Indeterminate right breast mass 9:30 o'clock Lesion quadrant: Upper outer quadrant Using sterile technique and 1% Lidocaine as local anesthetic, under direct ultrasound visualization, a 14 gauge spring-loaded device was used to perform biopsy of right breast mass 9:30 o'clock using a lateral approach. At the conclusion of the procedure coil shaped tissue marker clip was deployed into the biopsy cavity. Follow up 2 view mammogram was performed and dictated separately. Site 2: Right axillary lymph node Lesion quadrant: Upper outer quadrant Using sterile technique and 1% Lidocaine as local anesthetic, under direct ultrasound visualization, a 14 gauge spring-loaded device was used to perform biopsy of right axillary lymph node using a lateral approach. At the conclusion of the procedure [facility] shaped tissue marker clip was deployed into the biopsy cavity. Follow up 2 view mammogram was performed and dictated separately. IMPRESSION: Ultrasound guided biopsy of right breast mass and right axillary lymph node. No apparent complications. Electronically Signed: By: Annia Belt M.D. On: 12/18/2022 08:27   MM CLIP PLACEMENT RIGHT  Result Date: 12/18/2022 CLINICAL DATA:  Indeterminate right breast mass and right axillary lymph node. EXAM: 3D DIAGNOSTIC RIGHT MAMMOGRAM POST ULTRASOUND BIOPSY COMPARISON:  Previous exam(s). FINDINGS: 3D Mammographic images were obtained following ultrasound guided biopsy of right breast  mass and right axillary lymph node. Site 1: Right breast mass 9:30 o'clock: Coil clip: In appropriate position. Site 2: Right axillary lymph node: HydroMARK clip: In appropriate position. IMPRESSION: Appropriate positioning of the biopsy marking clips. Final Assessment: Post Procedure Mammograms for Marker Placement Electronically Signed   By: Annia Belt M.D.   On: 12/18/2022 09:39   MM 3D DIAGNOSTIC MAMMOGRAM BILATERAL BREAST  Result Date: 12/12/2022 CLINICAL DATA:  42 year old presenting with a possible palpable lump in the outer RIGHT breast at the near 3 o'clock location. This is the patient's initial baseline mammogram. Patient states no family history of breast cancer. EXAM: DIGITAL DIAGNOSTIC BILATERAL MAMMOGRAM  WITH TOMOSYNTHESIS AND CAD; ULTRASOUND RIGHT BREAST LIMITED TECHNIQUE: Bilateral digital diagnostic mammography and breast tomosynthesis was performed. The images were evaluated with computer-aided detection. ; Targeted ultrasound examination of the right breast was performed COMPARISON:  None. ACR Breast Density Category b: There are scattered areas of fibroglandular density. FINDINGS: Full field CC and MLO views of both breasts and a spot tangential view of the palpable concern in the RIGHT breast were obtained. RIGHT: No findings suspicious malignancy. Normal fibroglandular tissue extending close to the skin surface in the outer breast at the site of palpable concern. Targeted ultrasound is performed in the area of palpable concern, demonstrating an irregular hypoechoic mass with angular margins involving a subcutaneous fat lobule at 9:30 o'clock 9 cm from nipple measuring approximately 1.3 x 0.9 x 0.9 cm, demonstrating peripheral power Doppler flow and slight posterior acoustic enhancement. The involved subcutaneous fat lobule is hyperechoic. Sonographic evaluation of the RIGHT axilla a solitary lymph node with focal cortical thickening up to 0.6 cm. On correlative physical examination,  there is a palpable approximate 1 cm superficial mass in the outer breast corresponding to what the patient is feeling and the sonographic abnormality. LEFT: No findings suspicious for malignancy. IMPRESSION: 1. Indeterminate 1.3 cm mass involving a subcutaneous fat lobule in the UPPER OUTER QUADRANT of the RIGHT breast at 9:30 o'clock 9 cm from the nipple. This mass is inconspicuous on mammography. While this mass may represent benign fat necrosis in the absence of a mammographic abnormality, tissue sampling is warranted based on its morphology. 2. Solitary abnormal RIGHT axillary lymph node with focal cortical thickening up to 0.6 cm. RECOMMENDATION: Ultrasound-guided core needle biopsy of the RIGHT breast mass and the solitary abnormal RIGHT axillary lymph node. I have discussed the findings and recommendations with the patient. The ultrasound core needle biopsy procedure was discussed with the patient and her questions were answered. She wishes proceed with the biopsy which has been scheduled by the Breast Imaging staff. BI-RADS CATEGORY  4: Suspicious. Electronically Signed   By: Hulan Saas M.D.   On: 12/12/2022 15:15   [place] LIMITED ULTRASOUND INCLUDING AXILLA RIGHT BREAST  Result Date: 12/12/2022 CLINICAL DATA:  42 year old presenting with a possible palpable lump in the outer RIGHT breast at the near 3 o'clock location. This is the patient's initial baseline mammogram. Patient states no family history of breast cancer. EXAM: DIGITAL DIAGNOSTIC BILATERAL MAMMOGRAM WITH TOMOSYNTHESIS AND CAD; ULTRASOUND RIGHT BREAST LIMITED TECHNIQUE: Bilateral digital diagnostic mammography and breast tomosynthesis was performed. The images were evaluated with computer-aided detection. ; Targeted ultrasound examination of the right breast was performed COMPARISON:  None. ACR Breast Density Category b: There are scattered areas of fibroglandular density. FINDINGS: Full field CC and MLO views of both breasts and a spot  tangential view of the palpable concern in the RIGHT breast were obtained. RIGHT: No findings suspicious malignancy. Normal fibroglandular tissue extending close to the skin surface in the outer breast at the site of palpable concern. Targeted ultrasound is performed in the area of palpable concern, demonstrating an irregular hypoechoic mass with angular margins involving a subcutaneous fat lobule at 9:30 o'clock 9 cm from nipple measuring approximately 1.3 x 0.9 x 0.9 cm, demonstrating peripheral power Doppler flow and slight posterior acoustic enhancement. The involved subcutaneous fat lobule is hyperechoic. Sonographic evaluation of the RIGHT axilla a solitary lymph node with focal cortical thickening up to 0.6 cm. On correlative physical examination, there is a palpable approximate 1 cm superficial mass in the outer  breast corresponding to what the patient is feeling and the sonographic abnormality. LEFT: No findings suspicious for malignancy. IMPRESSION: 1. Indeterminate 1.3 cm mass involving a subcutaneous fat lobule in the UPPER OUTER QUADRANT of the RIGHT breast at 9:30 o'clock 9 cm from the nipple. This mass is inconspicuous on mammography. While this mass may represent benign fat necrosis in the absence of a mammographic abnormality, tissue sampling is warranted based on its morphology. 2. Solitary abnormal RIGHT axillary lymph node with focal cortical thickening up to 0.6 cm. RECOMMENDATION: Ultrasound-guided core needle biopsy of the RIGHT breast mass and the solitary abnormal RIGHT axillary lymph node. I have discussed the findings and recommendations with the patient. The ultrasound core needle biopsy procedure was discussed with the patient and her questions were answered. She wishes proceed with the biopsy which has been scheduled by the Breast Imaging staff. BI-RADS CATEGORY  4: Suspicious. Electronically Signed   By: Hulan Saas M.D.   On: 12/12/2022 15:15      IMPRESSION: Stage IIA (cT1c,  cN1, cM0) Right Breast UOQ, Invasive Ductal Carcinoma, ER- / PR- / Her2+, Grade 2  Patient will be a good candidate for breast conservation with radiotherapy to the right breast. We discussed the general course of radiation, potential side effects, and toxicities with radiation and the patient is interested in this approach.    PLAN:  Genetics  Referral to research for the ICE study MRI  Chemo class / Port placement  Neoadjuvant chemotherapy  Right breast lumpectomy with right TAD Adjuvant radiation therapy    ------------------------------------------------  Billie Lade, PhD, MD  This document serves as a record of services personally performed by Antony Blackbird, MD. It was created on his behalf by Neena Rhymes, a trained medical scribe. The creation of this record is based on the scribe's personal observations and the provider's statements to them. This document has been checked and approved by the attending provider.

## 2022-12-27 NOTE — Progress Notes (Signed)
[facility] Spiritual Care Note  Met with Hynlee, her husband, and her parents in Breast Multidisciplinary Clinic to introduce Support Center team/resources.  she completed SDOH screening; results follow below.    SDOH Screenings   Food Insecurity: No Food Insecurity (12/27/2022)  Housing: Low Risk  (12/27/2022)  Transportation Needs: No Transportation Needs (12/27/2022)  Utilities: Not At Risk (12/27/2022)  Depression (PHQ2-9): Low Risk  (12/27/2022)  Tobacco Use: Low Risk  (12/27/2022)    Chaplain and patient discussed common feelings and emotions when being diagnosed with cancer, and the importance of support during treatment.  Chaplain informed patient of the support team and support services at [facility].  Chaplain provided contact information and encouraged patient to call with any questions or concerns.  Ily is looking forward to her son's coming home from [employer], her alma mater x2, for winter break. She reports that she is feeling better after meeting her team and learning her treatment plan at [facility], but she has been having a lot of trouble sleeping lately.   Follow up needed: Yes.  Aarica requests a follow-up phone call next week.   [address] [place], [place], [facility] Pager [phone] Voicemail [phone]

## 2022-12-28 ENCOUNTER — Telehealth: Payer: Self-pay | Admitting: *Deleted

## 2022-12-28 ENCOUNTER — Encounter (HOSPITAL_BASED_OUTPATIENT_CLINIC_OR_DEPARTMENT_OTHER): Payer: Self-pay | Admitting: Surgery

## 2022-12-28 ENCOUNTER — Other Ambulatory Visit: Payer: Self-pay

## 2022-12-28 MED ORDER — ALPRAZOLAM 0.5 MG PO TABS: 0.5000 mg | ORAL_TABLET | Freq: Every evening | ORAL | 0 refills | Status: DC | PRN

## 2022-12-28 NOTE — Telephone Encounter (Signed)
Spoke with patient to review appt time/date/location.  Patient verbalized understanding.  Patient is having trouble sleeping and having some anxiety. She has taken Xanax before which she states offered her relief. Informed her we would call some in for her.

## 2022-12-29 ENCOUNTER — Other Ambulatory Visit: Payer: BC Managed Care – PPO

## 2022-12-29 ENCOUNTER — Telehealth: Payer: Self-pay

## 2022-12-29 ENCOUNTER — Other Ambulatory Visit: Payer: Self-pay | Admitting: Hematology and Oncology

## 2022-12-29 ENCOUNTER — Other Ambulatory Visit: Payer: Self-pay | Admitting: Pharmacist

## 2022-12-29 DIAGNOSIS — C50411 Malignant neoplasm of upper-outer quadrant of right female breast: Secondary | ICD-10-CM

## 2022-12-29 NOTE — Telephone Encounter (Signed)
[id number], ICE COMPRESS: RANDOMIZED TRIAL OF LIMB CRYOCOMPRESSION VERSUS CONTINUOUS COMPRESSION VERSUS LOW CYCLIC COMPRESSION FOR THE PREVENTION  OF TAXANE-INDUCED PERIPHERAL NEUROPATHY  Research Rn called pt to introduce study to pt. Rn explained what the study objective was and all study requirements. Research RN answered all pt questions. Rn will call pt on Thursday 01-04-23  before 1pm to discuss this study more and make a potential plan to obtain consent. Rn provided call back details to pt if she had any questions or concerns before the follow up call.   Zerita Boers BSN RN Clinical Research Nurse [facility] Direct Dial: [phone] 12/29/2022  10:28 AM

## 2022-12-30 ENCOUNTER — Other Ambulatory Visit: Payer: Self-pay

## 2023-01-01 ENCOUNTER — Inpatient Hospital Stay: Payer: BC Managed Care – PPO | Admitting: Pharmacist

## 2023-01-01 ENCOUNTER — Inpatient Hospital Stay: Payer: BC Managed Care – PPO

## 2023-01-01 ENCOUNTER — Telehealth: Payer: Self-pay | Admitting: *Deleted

## 2023-01-01 ENCOUNTER — Encounter: Payer: Self-pay | Admitting: General Practice

## 2023-01-01 ENCOUNTER — Ambulatory Visit (HOSPITAL_COMMUNITY)
Admission: RE | Admit: 2023-01-01 | Discharge: 2023-01-01 | Disposition: A | Discharge status: Home / Self Care | Payer: BC Managed Care – PPO | Source: Ambulatory Visit | Attending: Hematology and Oncology | Admitting: Hematology and Oncology

## 2023-01-01 ENCOUNTER — Encounter: Payer: Self-pay | Admitting: *Deleted

## 2023-01-01 DIAGNOSIS — Z5112 Encounter for antineoplastic immunotherapy: Secondary | ICD-10-CM | POA: Diagnosis not present

## 2023-01-01 DIAGNOSIS — Z171 Estrogen receptor negative status [ER-]: Secondary | ICD-10-CM | POA: Insufficient documentation

## 2023-01-01 DIAGNOSIS — C50411 Malignant neoplasm of upper-outer quadrant of right female breast: Secondary | ICD-10-CM | POA: Insufficient documentation

## 2023-01-01 DIAGNOSIS — Z800 Family history of malignant neoplasm of digestive organs: Secondary | ICD-10-CM | POA: Diagnosis not present

## 2023-01-01 DIAGNOSIS — Z01818 Encounter for other preprocedural examination: Secondary | ICD-10-CM | POA: Insufficient documentation

## 2023-01-01 DIAGNOSIS — Z0189 Encounter for other specified special examinations: Secondary | ICD-10-CM | POA: Diagnosis not present

## 2023-01-01 DIAGNOSIS — Z803 Family history of malignant neoplasm of breast: Secondary | ICD-10-CM | POA: Diagnosis not present

## 2023-01-01 DIAGNOSIS — Z5189 Encounter for other specified aftercare: Secondary | ICD-10-CM | POA: Diagnosis not present

## 2023-01-01 DIAGNOSIS — Z5111 Encounter for antineoplastic chemotherapy: Secondary | ICD-10-CM | POA: Diagnosis not present

## 2023-01-01 LAB — ECHOCARDIOGRAM COMPLETE
AR max vel: 3.26 cm2
AV Area VTI: 3.59 cm2
AV Area mean vel: 2.92 cm2
AV Mean grad: 3 mm[Hg]
AV Peak grad: 6.4 mm[Hg]
Ao pk vel: 1.26 m/s
Area-P 1/2: 4.36 cm2
Calc EF: 66.7 %
MV VTI: 3.99 cm2
S' Lateral: 3.1 cm
Single Plane A2C EF: 66.2 %
Single Plane A4C EF: 67.8 %

## 2023-01-01 NOTE — Progress Notes (Signed)
  Echocardiogram 2D Echocardiogram has been performed.  Ocie Doyne RDCS 01/01/2023, 10:53 AM

## 2023-01-01 NOTE — Telephone Encounter (Signed)
Spoke to pt concerning [facility] from 12/27/22. Denies questions or concerns regarding dx or treatment care plan. Encourage pt to call with needs. Received verbal understanding.   Discussed we were unaware of MRI being cx and that we are working to get it scheduled asap.  Pt informed having difficulty sleeping. Provided some instructions on attempting benadryl 25mg . Pt informed she has not tried that. Also discussed having some difficulty with cancer dx. Validated feelings and provided encouragement. Discussed referral to pt and family support team. Pt agree to referral.

## 2023-01-01 NOTE — Progress Notes (Signed)
[facility] Spiritual Care Note  Followed up briefly by phone, scheduling appointment to talk in more detail tomorrow at 10 am. Kara Webb notes that she is looking forward to time to process, but is "talked out today" after long appointments for chemo education.   [address] Rush Barer, [place], [facility] Pager [phone] Voicemail [phone]

## 2023-01-01 NOTE — Progress Notes (Signed)
[facility]       Telephone: [phone]?Fax: [phone]   Oncology Clinical Pharmacist Practitioner Initial Assessment  Kara Webb is a 42 y.o. female with a diagnosis of breast cancer. They were contacted today via in-person visit.  Indication/Regimen TCHP: Trastuzumab (Herceptin) and pertuzumab (Perjeta) and docetaxel (Taxotere) and carboplatin (Paraplatin) are being used appropriately for treatment of breast cancer by Dr. Serena Croissant.      Wt Readings from Last 1 Encounters:  12/27/22 290 lb 11.2 oz (131.9 kg)    Estimated body surface area is 2.48 meters squared as calculated from the following:   Height as of 12/28/22: 5\' 6"  (1.676 m).   Weight as of 12/28/22: 290 lb 12.6 oz (131.9 kg).  The dosing regimen is every 21 days for 6 cycles  Trastuzumab (8 mg/kg load, 6 mg/kg maintenance) on Day 1 Pertuzumab (840 mg load, 420 mg maintenance) on Day 1 Docetaxel (75 mg/m2) on Day 1 Carboplatin (AUC 6) on Day 1 Pegfilgrastim (6 mg) on Day 3  Dose Modifications None   Allergies No Known Allergies  Vitals: No vitals or labs were done today for this chemotherapy education visit     12/28/2022    2:09 PM 12/27/2022   12:46 PM 09/25/2018    5:49 AM  Oncology Vitals  Height 168 cm 168 cm   Weight 131.9 kg 131.861 kg   Weight (lbs) 290 lbs 13 oz 290 lbs 11 oz   BMI 46.93 kg/m2 46.92 kg/m2   Temp  97.9 F (36.6 C)   Pulse Rate  114 81  BP  144/97 144/96  Resp  18 17  SpO2  100 % 98 %  BSA (m2) 2.48 m2 2.48 m2      Laboratory Data    Latest Ref Rng & Units 12/27/2022   12:22 PM 09/25/2018   12:52 AM  CBC EXTENDED  WBC 4.0 - 10.5 K/uL 3.8  6.9   RBC 3.87 - 5.11 MIL/uL 4.64  4.76   Hemoglobin 12.0 - 15.0 g/dL 16.0  10.9   HCT 32.3 - 46.0 % 33.1  39.4   Platelets 150 - 400 K/uL 267  202   NEUT# 1.7 - 7.7 K/uL 1.8    Lymph# 0.7 - 4.0 K/uL 1.4         Latest Ref Rng & Units 12/27/2022   12:22 PM 09/25/2018   12:52 AM  CMP  Glucose 70 -  99 mg/dL 98  557   BUN 6 - 20 mg/dL 9  15   Creatinine 3.22 - 1.00 mg/dL 0.25  4.27   Sodium 062 - 145 mmol/L 137  138   Potassium 3.5 - 5.1 mmol/L 3.7  3.5   Chloride 98 - 111 mmol/L 106  105   CO2 22 - 32 mmol/L 26  25   Calcium 8.9 - 10.3 mg/dL 9.2  9.7   Total Protein 6.5 - 8.1 g/dL 7.4    Total Bilirubin <1.2 mg/dL 0.3    Alkaline Phos 38 - 126 U/L 78    AST 15 - 41 U/L 16    ALT 0 - 44 U/L 28     Contraindications Contraindications were reviewed? Yes Contraindications to therapy were identified? No   Safety Precautions (written information also provided) The following safety precautions for the use of TCHP were reviewed:  Decreased hemoglobin, part of the red blood cells that carry iron and oxygen Decreased platelet count and increased risk of bleeding Decreased white  blood cells (WBCs) and increased risk for infection Fever: reviewed the importance of having a thermometer and the Centers for Disease Control and Prevention (CDC) definition of fever which is 100.68F (38C) or higher. Patient should call 24/7 triage at [phone] if experiencing a fever or any other symptoms Hair Loss Muscle or joint pain or weakness Fatigue Nausea or vomiting Mouth sores Diarrhea Irregular menses (if applicable) Peripheral neuropathy: numbness or tingling in hands and feet Fluid retention or swelling (edema) Nail Changes Rash or itchy skin Fluid retention or swelling (edema) Taste changes Changes in kidney function Changes in electrolytes and other laboratory values (low potassium, low magnesium) Cardiotoxicity from trastuzumab Infusion reactions Pneumonitis from trastuzumab Docetaxel irritating veins Liver toxicity from docetaxel Docetaxel can cause eye pain, blurred vision, tearing, and light sensitivity Handling body fluids and waste Intimacy, sexual activity, contraception, fertility  Medication Reconciliation Current Outpatient Medications  Medication Sig Dispense  Refill   ALPRAZolam (NIRAVAM) 0.5 MG dissolvable tablet Take 0.5 mg by mouth at bedtime as needed for anxiety.     ALPRAZolam (XANAX) 0.5 MG tablet Take 1 tablet (0.5 mg total) by mouth at bedtime as needed for anxiety or sleep. 20 tablet 0   cetirizine (ZYRTEC) 10 MG chewable tablet Zyrtec     dexamethasone (DECADRON) 4 MG tablet Take 2 tabs by mouth 2 times daily starting day before chemo. Then take 2 tabs daily for 2 days starting day after chemo. Take with food. 30 tablet 1   IUD'S IU IUD's     lidocaine-prilocaine (EMLA) cream Apply to affected area once 30 g 3   ondansetron (ZOFRAN) 8 MG tablet Take 1 tablet (8 mg total) by mouth every 8 (eight) hours as needed for nausea or vomiting. Start on the third day after chemotherapy. 30 tablet 1   prochlorperazine (COMPAZINE) 10 MG tablet Take 1 tablet (10 mg total) by mouth every 6 (six) hours as needed for nausea or vomiting. 30 tablet 1   No current facility-administered medications for this visit.    Medication reconciliation is based on the patient's most recent medication list in the electronic medical record (EMR) including herbal products and OTC medications.   The patient's medication list was reviewed today with the patient? Yes   Drug-drug interactions (DDIs) DDIs were evaluated? Yes Significant DDIs identified? No   Drug-Food Interactions Drug-food interactions were evaluated? Yes Drug-food interactions identified? No   Follow-up Plan  Treatment start date: 01/12/23 Port placement date: 01/03/23 ECHO date: 01/01/23 We reviewed the prescriptions, premedications, and treatment regimen with the patient. Possible side effects of the treatment regimen were reviewed and management strategies were discussed.  Can use loperamide as needed for diarrhea, loratadine as needed for G-CSF bone pain, and Senna-S as needed for constipation.  Will let Kara Webb know that Kara Webb said the 0.5 mg PRN alprazolam is not effective so that he  can determine next steps with sleep issues. She stated she is using good sleep hygiene habits Clinical pharmacy will assist Dr. Serena Croissant and Kara Webb on an as needed basis going forward  Kara Webb participated in the discussion, expressed understanding, and voiced agreement with the above plan. All questions were answered to her satisfaction. The patient was advised to contact the clinic at [phone] with any questions or concerns prior to her return visit.   I spent 60 minutes assessing the patient.  Nakiea Metzner A. Odetta Pink, PharmD, BCOP, CPP  Anselm Lis, RPH-CPP, 01/01/2023 10:46 AM  **Disclaimer:  This note was dictated with voice recognition software. Similar sounding words can inadvertently be transcribed and this note may contain transcription errors which may not have been corrected upon publication of note.**

## 2023-01-02 ENCOUNTER — Encounter: Payer: Self-pay | Admitting: General Practice

## 2023-01-02 ENCOUNTER — Inpatient Hospital Stay: Payer: BC Managed Care – PPO | Admitting: Licensed Clinical Social Worker

## 2023-01-02 MED ORDER — CHLORHEXIDINE GLUCONATE CLOTH 2 % EX PADS: 6.0000 | MEDICATED_PAD | Freq: Once | CUTANEOUS | Status: DC

## 2023-01-02 NOTE — Progress Notes (Signed)
[facility] Spiritual Care Note  Met with Kara Webb briefly by phone to provide opportunity for her to process her feelings, especially stress/worries, about preparing for treatment after attending chemo education class. Normalized feelings, addressing how "it's only the first time once" and repeat exposures can help desensitize and even empower with familiarity. Kara Webb found this perspective helpful. She had to take a work call, but plans to follow up by phone.   [address] Rush B[place], [place], [facility] Pager [phone] Voicemail [phone]

## 2023-01-02 NOTE — Anesthesia Preprocedure Evaluation (Signed)
Anesthesia Evaluation  Patient identified by MRN, date of birth, ID band Patient awake    Reviewed: Allergy & Precautions, NPO status , Patient's Chart, lab work & pertinent test results  Airway Mallampati: III  TM Distance: >3 FB Neck ROM: Full    Dental  (+) Dental Advisory Given, Teeth Intact   Pulmonary neg pulmonary ROS   Pulmonary exam normal breath sounds clear to auscultation       Cardiovascular Normal cardiovascular exam+ Valvular Problems/Murmurs  Rhythm:Regular Rate:Normal     Neuro/Psych negative neurological ROS     GI/Hepatic negative GI ROS, Neg liver ROS,,,  Endo/Other    Class 3 obesity  Renal/GU negative Renal ROS     Musculoskeletal negative musculoskeletal ROS (+)    Abdominal  (+) + obese  Peds  Hematology negative hematology ROS (+)   Anesthesia Other Findings   Reproductive/Obstetrics                             Anesthesia Physical Anesthesia Plan  ASA: 3  Anesthesia Plan: General   Post-op Pain Management: Tylenol PO (pre-op)* and Gabapentin PO (pre-op)*   Induction: Intravenous  PONV Risk Score and Plan: 3 and Treatment may vary due to age or medical condition, Midazolam, Ondansetron and Dexamethasone  Airway Management Planned: LMA  Additional Equipment:   Intra-op Plan:   Post-operative Plan: Extubation in OR  Informed Consent: I have reviewed the patients History and Physical, chart, labs and discussed the procedure including the risks, benefits and alternatives for the proposed anesthesia with the patient or authorized representative who has indicated his/her understanding and acceptance.     Dental advisory given  Plan Discussed with: CRNA  Anesthesia Plan Comments:        Anesthesia Quick Evaluation

## 2023-01-02 NOTE — Progress Notes (Signed)
[facility] Clinical Social Work  Clinical Social Work was referred by self for financial resources.  Clinical Social Worker received TC from patient inquiring about any assistance with tuition for children of cancer patients. 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 She is currently unable to do her part-time job with treatment starting, reducing her income.  CSW is unaware of scholarships or grants for tuition for children of patients but will research and notify pt if any are identified. CSW also discussed [employer] with pt which can assist with some non-medical expenses while she is in treatment. Pt also inquired if she might now qualify for Medicaid due to diagnosis. CSW provided information for Delano Metz to help with questions regarding [facility] Medicaid.     Kara Webb E Shenequa Howse, LCSW  Clinical Social Worker [facility]

## 2023-01-02 NOTE — Progress Notes (Signed)

## 2023-01-03 ENCOUNTER — Ambulatory Visit (HOSPITAL_COMMUNITY): Payer: BC Managed Care – PPO

## 2023-01-03 ENCOUNTER — Telehealth: Payer: Self-pay

## 2023-01-03 ENCOUNTER — Ambulatory Visit (HOSPITAL_BASED_OUTPATIENT_CLINIC_OR_DEPARTMENT_OTHER): Payer: Self-pay | Admitting: Anesthesiology

## 2023-01-03 ENCOUNTER — Ambulatory Visit (HOSPITAL_BASED_OUTPATIENT_CLINIC_OR_DEPARTMENT_OTHER)
Admission: RE | Admit: 2023-01-03 | Discharge: 2023-01-03 | Disposition: A | Discharge status: Home / Self Care | Payer: BC Managed Care – PPO | Attending: Surgery | Admitting: Surgery

## 2023-01-03 ENCOUNTER — Encounter (HOSPITAL_BASED_OUTPATIENT_CLINIC_OR_DEPARTMENT_OTHER)
Admission: RE | Disposition: A | Discharge status: Home / Self Care | Payer: Self-pay | Source: Home / Self Care | Attending: Surgery

## 2023-01-03 ENCOUNTER — Other Ambulatory Visit: Payer: Self-pay

## 2023-01-03 ENCOUNTER — Ambulatory Visit: Payer: BC Managed Care – PPO | Admitting: Pharmacist

## 2023-01-03 ENCOUNTER — Encounter (HOSPITAL_BASED_OUTPATIENT_CLINIC_OR_DEPARTMENT_OTHER): Payer: Self-pay | Admitting: Surgery

## 2023-01-03 ENCOUNTER — Other Ambulatory Visit: Payer: BC Managed Care – PPO

## 2023-01-03 DIAGNOSIS — Z6841 Body Mass Index (BMI) 40.0 and over, adult: Secondary | ICD-10-CM | POA: Diagnosis not present

## 2023-01-03 DIAGNOSIS — E66813 Obesity, class 3: Secondary | ICD-10-CM | POA: Insufficient documentation

## 2023-01-03 DIAGNOSIS — Z01818 Encounter for other preprocedural examination: Secondary | ICD-10-CM

## 2023-01-03 DIAGNOSIS — Z1722 Progesterone receptor negative status: Secondary | ICD-10-CM | POA: Insufficient documentation

## 2023-01-03 DIAGNOSIS — C50411 Malignant neoplasm of upper-outer quadrant of right female breast: Secondary | ICD-10-CM | POA: Diagnosis not present

## 2023-01-03 DIAGNOSIS — C50919 Malignant neoplasm of unspecified site of unspecified female breast: Secondary | ICD-10-CM | POA: Diagnosis not present

## 2023-01-03 DIAGNOSIS — Z171 Estrogen receptor negative status [ER-]: Secondary | ICD-10-CM | POA: Diagnosis not present

## 2023-01-03 DIAGNOSIS — Z452 Encounter for adjustment and management of vascular access device: Secondary | ICD-10-CM | POA: Diagnosis not present

## 2023-01-03 HISTORY — PX: PORTACATH PLACEMENT: SHX2246

## 2023-01-03 LAB — POCT PREGNANCY, URINE: Preg Test, Ur: NEGATIVE

## 2023-01-03 SURGERY — INSERTION, TUNNELED CENTRAL VENOUS DEVICE, WITH PORT
Anesthesia: General | Site: Chest

## 2023-01-03 MED ORDER — BUPIVACAINE-EPINEPHRINE (PF) 0.25% -1:200000 IJ SOLN
INTRAMUSCULAR | Status: AC
  Filled 2023-01-03: qty 30

## 2023-01-03 MED ORDER — PROPOFOL 10 MG/ML IV BOLUS
INTRAVENOUS | Status: AC
  Filled 2023-01-03: qty 20

## 2023-01-03 MED ORDER — MIDAZOLAM HCL 5 MG/5ML IJ SOLN
INTRAMUSCULAR | Status: DC | PRN
  Administered 2023-01-03: 2 mg via INTRAVENOUS

## 2023-01-03 MED ORDER — GABAPENTIN 300 MG PO CAPS
300.0000 mg | ORAL_CAPSULE | ORAL | Status: AC
  Administered 2023-01-03: 300 mg via ORAL

## 2023-01-03 MED ORDER — DROPERIDOL 2.5 MG/ML IJ SOLN: 0.6250 mg | Freq: Once | INTRAMUSCULAR | Status: DC | PRN

## 2023-01-03 MED ORDER — FENTANYL CITRATE (PF) 100 MCG/2ML IJ SOLN: 25.0000 ug | INTRAMUSCULAR | Status: DC | PRN

## 2023-01-03 MED ORDER — ONDANSETRON HCL 4 MG/2ML IJ SOLN
INTRAMUSCULAR | Status: AC
  Filled 2023-01-03: qty 2

## 2023-01-03 MED ORDER — LIDOCAINE 2% (20 MG/ML) 5 ML SYRINGE
INTRAMUSCULAR | Status: AC
Start: 2023-01-03 — End: ?
  Filled 2023-01-03: qty 5

## 2023-01-03 MED ORDER — MIDAZOLAM HCL 2 MG/2ML IJ SOLN
INTRAMUSCULAR | Status: AC
  Filled 2023-01-03: qty 2

## 2023-01-03 MED ORDER — ACETAMINOPHEN 500 MG PO TABS: 1000.0000 mg | ORAL_TABLET | ORAL | Status: DC

## 2023-01-03 MED ORDER — DEXAMETHASONE SODIUM PHOSPHATE 10 MG/ML IJ SOLN
INTRAMUSCULAR | Status: AC
Start: 2023-01-03 — End: ?
  Filled 2023-01-03: qty 1

## 2023-01-03 MED ORDER — SODIUM CHLORIDE 0.9 % IV SOLN: INTRAVENOUS | Status: DC | PRN

## 2023-01-03 MED ORDER — GABAPENTIN 300 MG PO CAPS
ORAL_CAPSULE | ORAL | Status: AC
Start: 2023-01-03 — End: ?
  Filled 2023-01-03: qty 1

## 2023-01-03 MED ORDER — HEPARIN (PORCINE) IN NACL 2-0.9 UNITS/ML
INTRAMUSCULAR | Status: AC | PRN
  Administered 2023-01-03: 1 via INTRAVENOUS

## 2023-01-03 MED ORDER — LACTATED RINGERS IV SOLN: INTRAVENOUS | Status: DC

## 2023-01-03 MED ORDER — DEXAMETHASONE SODIUM PHOSPHATE 10 MG/ML IJ SOLN
INTRAMUSCULAR | Status: DC | PRN
  Administered 2023-01-03: 10 mg via INTRAVENOUS

## 2023-01-03 MED ORDER — ACETAMINOPHEN 500 MG PO TABS
1000.0000 mg | ORAL_TABLET | Freq: Once | ORAL | Status: AC
Start: 2023-01-03 — End: 2023-01-03
  Administered 2023-01-03: 1000 mg via ORAL

## 2023-01-03 MED ORDER — HEPARIN (PORCINE) IN NACL 1000-0.9 UT/500ML-% IV SOLN
INTRAVENOUS | Status: AC
Start: 2023-01-03 — End: ?
  Filled 2023-01-03: qty 500

## 2023-01-03 MED ORDER — OXYCODONE HCL 5 MG PO TABS: 5.0000 mg | ORAL_TABLET | Freq: Four times a day (QID) | ORAL | 0 refills | Status: DC | PRN

## 2023-01-03 MED ORDER — BUPIVACAINE-EPINEPHRINE 0.25% -1:200000 IJ SOLN
INTRAMUSCULAR | Status: DC | PRN
  Administered 2023-01-03: 10 mL

## 2023-01-03 MED ORDER — SODIUM CHLORIDE 0.9 % IV SOLN
INTRAVENOUS | Status: AC
  Filled 2023-01-03: qty 3

## 2023-01-03 MED ORDER — ONDANSETRON HCL 4 MG/2ML IJ SOLN
INTRAMUSCULAR | Status: DC | PRN
  Administered 2023-01-03: 4 mg via INTRAVENOUS

## 2023-01-03 MED ORDER — SODIUM CHLORIDE 0.9 % IV SOLN
3.0000 g | INTRAVENOUS | Status: AC
  Administered 2023-01-03: 3 g via INTRAVENOUS

## 2023-01-03 MED ORDER — HEPARIN SOD (PORK) LOCK FLUSH 100 UNIT/ML IV SOLN
INTRAVENOUS | Status: DC | PRN
  Administered 2023-01-03: 500 [IU] via INTRAVENOUS

## 2023-01-03 MED ORDER — OXYCODONE HCL 5 MG PO TABS
5.0000 mg | ORAL_TABLET | Freq: Once | ORAL | Status: DC | PRN
Start: 2023-01-03 — End: 2023-01-03

## 2023-01-03 MED ORDER — HEPARIN SOD (PORK) LOCK FLUSH 100 UNIT/ML IV SOLN
INTRAVENOUS | Status: AC
Start: 2023-01-03 — End: ?
  Filled 2023-01-03: qty 5

## 2023-01-03 MED ORDER — FENTANYL CITRATE (PF) 100 MCG/2ML IJ SOLN
INTRAMUSCULAR | Status: AC
  Filled 2023-01-03: qty 2

## 2023-01-03 MED ORDER — PROPOFOL 10 MG/ML IV BOLUS
INTRAVENOUS | Status: AC
Start: 2023-01-03 — End: ?
  Filled 2023-01-03: qty 20

## 2023-01-03 MED ORDER — LIDOCAINE 2% (20 MG/ML) 5 ML SYRINGE
INTRAMUSCULAR | Status: DC | PRN
  Administered 2023-01-03: 100 mg via INTRAVENOUS

## 2023-01-03 MED ORDER — PROPOFOL 10 MG/ML IV BOLUS
INTRAVENOUS | Status: DC | PRN
  Administered 2023-01-03: 250 mg via INTRAVENOUS

## 2023-01-03 MED ORDER — FENTANYL CITRATE (PF) 100 MCG/2ML IJ SOLN
INTRAMUSCULAR | Status: DC | PRN
  Administered 2023-01-03: 25 ug via INTRAVENOUS
  Administered 2023-01-03: 50 ug via INTRAVENOUS
  Administered 2023-01-03: 25 ug via INTRAVENOUS

## 2023-01-03 MED ORDER — ACETAMINOPHEN 500 MG PO TABS
ORAL_TABLET | ORAL | Status: AC
  Filled 2023-01-03: qty 2

## 2023-01-03 MED ORDER — OXYCODONE HCL 5 MG/5ML PO SOLN: 5.0000 mg | Freq: Once | ORAL | Status: DC | PRN

## 2023-01-03 SURGICAL SUPPLY — 51 items
BAG DECANTER FOR FLEXI CONT (MISCELLANEOUS) ×2 IMPLANT
BENZOIN TINCTURE PRP APPL 2/3 (GAUZE/BANDAGES/DRESSINGS) IMPLANT
BLADE HEX COATED 2.75 (ELECTRODE) ×2 IMPLANT
BLADE SURG 11 STRL SS (BLADE) ×2 IMPLANT
BLADE SURG 15 STRL LF DISP TIS (BLADE) ×2 IMPLANT
CANISTER SUCT 1200ML W/VALVE (MISCELLANEOUS) IMPLANT
CHLORAPREP W/TINT 26 (MISCELLANEOUS) ×2 IMPLANT
COVER BACK TABLE 60X90IN (DRAPES) ×2 IMPLANT
COVER MAYO STAND STRL (DRAPES) ×2 IMPLANT
DERMABOND ADVANCED .7 DNX12 (GAUZE/BANDAGES/DRESSINGS) ×2 IMPLANT
DRAPE C-ARM 42X72 X-RAY (DRAPES) ×2 IMPLANT
DRAPE LAPAROSCOPIC ABDOMINAL (DRAPES) ×2 IMPLANT
DRAPE UTILITY XL STRL (DRAPES) ×2 IMPLANT
DRSG TEGADERM 2-3/8X2-3/4 SM (GAUZE/BANDAGES/DRESSINGS) IMPLANT
DRSG TEGADERM 4X4.75 (GAUZE/BANDAGES/DRESSINGS) IMPLANT
ELECT REM PT RETURN 9FT ADLT (ELECTROSURGICAL) ×1 IMPLANT
ELECTRODE REM PT RTRN 9FT ADLT (ELECTROSURGICAL) ×2 IMPLANT
GAUZE 4X4 16PLY ~~LOC~~+RFID DBL (SPONGE) ×2 IMPLANT
GAUZE SPONGE 4X4 12PLY STRL LF (GAUZE/BANDAGES/DRESSINGS) IMPLANT
GLOVE BIOGEL PI IND STRL 8 (GLOVE) ×2 IMPLANT
GLOVE ECLIPSE 8.0 STRL XLNG CF (GLOVE) ×2 IMPLANT
GOWN STRL REUS W/ TWL LRG LVL3 (GOWN DISPOSABLE) ×4 IMPLANT
GOWN STRL REUS W/ TWL XL LVL3 (GOWN DISPOSABLE) ×2 IMPLANT
IV KIT MINILOC 20X1 SAFETY (NEEDLE) IMPLANT
KIT CVR 48X5XPRB PLUP LF (MISCELLANEOUS) ×2 IMPLANT
KIT PORT POWER 8FR ISP CVUE (Port) IMPLANT
NDL HYPO 22X1.5 SAFETY MO (MISCELLANEOUS) IMPLANT
NDL HYPO 25X1 1.5 SAFETY (NEEDLE) ×2 IMPLANT
NDL SAFETY ECLIPSE 18X1.5 (NEEDLE) IMPLANT
NDL SPNL 22GX3.5 QUINCKE BK (NEEDLE) IMPLANT
NEEDLE HYPO 22X1.5 SAFETY MO (MISCELLANEOUS) IMPLANT
NEEDLE HYPO 25X1 1.5 SAFETY (NEEDLE) ×1 IMPLANT
NEEDLE SPNL 22GX3.5 QUINCKE BK (NEEDLE) IMPLANT
PACK BASIN DAY SURGERY FS (CUSTOM PROCEDURE TRAY) ×2 IMPLANT
PENCIL SMOKE EVACUATOR (MISCELLANEOUS) ×2 IMPLANT
SET SHEATH INTRODUCER 10FR (MISCELLANEOUS) IMPLANT
SHEATH COOK PEEL AWAY SET 9F (SHEATH) IMPLANT
SLEEVE SCD COMPRESS KNEE MED (STOCKING) ×2 IMPLANT
SPIKE FLUID TRANSFER (MISCELLANEOUS) IMPLANT
SPONGE T-LAP 4X18 ~~LOC~~+RFID (SPONGE) IMPLANT
STRIP CLOSURE SKIN 1/2X4 (GAUZE/BANDAGES/DRESSINGS) IMPLANT
SUT MON AB 4-0 PC3 18 (SUTURE) ×2 IMPLANT
SUT PROLENE 2 0 CT2 30 (SUTURE) IMPLANT
SUT PROLENE 2 0 SH DA (SUTURE) ×2 IMPLANT
SUT SILK 2 0 TIES 17X18 (SUTURE) IMPLANT
SUT VICRYL 3-0 CR8 SH (SUTURE) ×2 IMPLANT
SYR 5ML LUER SLIP (SYRINGE) ×2 IMPLANT
SYR CONTROL 10ML LL (SYRINGE) ×2 IMPLANT
TOWEL GREEN STERILE FF (TOWEL DISPOSABLE) ×4 IMPLANT
TUBE CONNECTING 20X1/4 (TUBING) IMPLANT
YANKAUER SUCT BULB TIP NO VENT (SUCTIONS) IMPLANT

## 2023-01-03 NOTE — Discharge Instructions (Addendum)
PORT-A-CATH: POST OP INSTRUCTIONS  Always review your discharge instruction sheet given to you by the facility where your surgery was performed.   A prescription for pain medication may be given to you upon discharge. Take your pain medication as prescribed, if needed. If narcotic pain medicine is not needed, then you make take acetaminophen (Tylenol) or ibuprofen (Advil) as needed.  Take your usually prescribed medications unless otherwise directed. If you need a refill on your pain medication, please contact our office. All narcotic pain medicine now requires a paper prescription.  Phoned in and fax refills are no longer allowed by law.  Prescriptions will not be filled after 5 pm or on weekends.  You should follow a light diet for the remainder of the day after your procedure. Most patients will experience some mild swelling and/or bruising in the area of the incision. It may take several days to resolve. It is common to experience some constipation if taking pain medication after surgery. Increasing fluid intake and taking a stool softener (such as Colace) will usually help or prevent this problem from occurring. A mild laxative (Milk of Magnesia or Miralax) should be taken according to package directions if there are no bowel movements after 48 hours.  Unless discharge instructions indicate otherwise, you may remove your bandages 48 hours after surgery, and you may shower at that time. You may have steri-strips (small white skin tapes) in place directly over the incision.  These strips should be left on the skin for 7-10 days.  If your surgeon used Dermabond (skin glue) on the incision, you may shower in 24 hours.  The glue will flake off over the next 2-3 weeks.  If your port is left accessed at the end of surgery (needle left in port), the dressing cannot get wet and should only by changed by a healthcare professional. When the port is no longer accessed (when the needle has been removed),  follow step 7.   ACTIVITIES:  Limit activity involving your arms for the next 72 hours. Do no strenuous exercise or activity for 1 week. You may drive when you are no longer taking prescription pain medication, you can comfortably wear a seatbelt, and you can maneuver your car. 10.You may need to see your doctor in the office for a follow-up appointment.  Please       check with your doctor.  11.When you receive a new Port-a-Cath, you will get a product guide and        ID card.  Please keep them in case you need them.  WHEN TO CALL YOUR DOCTOR [phone]): Fever over 101.0 Chills Continued bleeding from incision Increased redness and tenderness at the site Shortness of breath, difficulty breathing   The clinic staff is available to answer your questions during regular business hours. Please don't hesitate to call and ask to speak to one of the nurses or medical assistants for clinical concerns. If you have a medical emergency, go to the nearest emergency room or call 911.  A surgeon from [facility] is always on call at the hospital.     For further information, please visit [email or web address]    No Tylenol until 1:30pm    Post Anesthesia Home Care Instructions  Activity: Get plenty of rest for the remainder of the day. A responsible individual must stay with you for 24 hours following the procedure.  For the next 24 hours, DO NOT: -Drive a car -Advertising copywriter -Drink alcoholic beverages -Take  any medication unless instructed by your physician -Make any legal decisions or sign important papers.  Meals: Start with liquid foods such as gelatin or soup. Progress to regular foods as tolerated. Avoid greasy, spicy, heavy foods. If nausea and/or vomiting occur, drink only clear liquids until the nausea and/or vomiting subsides. Call your physician if vomiting continues.  Special Instructions/Symptoms: Your throat may feel dry or sore from the  anesthesia or the breathing tube placed in your throat during surgery. If this causes discomfort, gargle with warm salt water. The discomfort should disappear within 24 hours.  If you had a scopolamine patch placed behind your ear for the management of post- operative nausea and/or vomiting:  1. The medication in the patch is effective for 72 hours, after which it should be removed.  Wrap patch in a tissue and discard in the trash. Wash hands thoroughly with soap and water. 2. You may remove the patch earlier than 72 hours if you experience unpleasant side effects which may include dry mouth, dizziness or visual disturbances. 3. Avoid touching the patch. Wash your hands with soap and water after contact with the patch.

## 2023-01-03 NOTE — H&P (Signed)
History of Present Illness: Kara Webb is a 42 y.o. female who is seen today as an office consultation for evaluation of Breast Cancer  Pleasant 43 year old female seen in the [facility] today for new diagnosis of right breast cancer. The patient fell nodule. Imaging revealed a 1.3 cm mass right breast upper outer quadrant and a large lymph node. 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 Denies any history of pain or discharge just of a mass. It has been present for multiple months.  Review of Systems: A complete review of systems was obtained from the patient. I have reviewed this information and discussed as appropriate with the patient. See HPI as well for other ROS.    Medical History: Past Medical History:  Diagnosis Date  Anxiety  History of cancer   Patient Active Problem List  Diagnosis  Malignant neoplasm of upper-outer quadrant of right breast in female, estrogen receptor negative (CMS/HHS-HCC)   Past Surgical History:  Procedure Laterality Date  .Breast Biopsy Right 12/18/2022  Toe Surgery  2004    No Known Allergies  No current outpatient medications on file prior to visit.   No current facility-administered medications on file prior to visit.   Family History  Problem Relation Age of Onset  Hyperlipidemia (Elevated cholesterol) Mother    Social History   Tobacco Use  Smoking Status Never  Smokeless Tobacco Never    Social History   Socioeconomic History  Marital status: Single  Tobacco Use  Smoking status: Never  Smokeless tobacco: Never  Vaping Use  Vaping status: Never Used  Substance and Sexual Activity  Alcohol use: Yes  Drug use: Never   Objective:  There were no vitals filed for this visit.  There is no height or weight on file to calculate BMI.  Physical Exam Exam conducted with a chaperone present.  HENT:  Head: Normocephalic.  Cardiovascular:  Rate  and Rhythm: Normal rate.  Pulmonary:  Effort: Pulmonary effort is normal.  Chest:  Breasts: Right: Normal.  Left: Normal.  Musculoskeletal:  Cervical back: Normal range of motion.  Lymphadenopathy:  Upper Body:  Right upper body: No supraclavicular or axillary adenopathy.  Left upper body: No supraclavicular or axillary adenopathy.  Skin: General: Skin is warm.  Neurological:  General: No focal deficit present.  Mental Status: She is alert.  Psychiatric:  Mood and Affect: Mood normal.     Labs, Imaging and Diagnostic Testing:  CLINICAL DATA: 42 year old presenting with a possible palpable lump  in the outer RIGHT breast at the near 3 o'clock location. This is  the patient's initial baseline mammogram.   Patient states no family history of breast cancer.   EXAM:  DIGITAL DIAGNOSTIC BILATERAL MAMMOGRAM WITH TOMOSYNTHESIS AND CAD;  ULTRASOUND RIGHT BREAST LIMITED   TECHNIQUE:  Bilateral digital diagnostic mammography and breast tomosynthesis  was performed. The images were evaluated with computer-aided  detection. ; Targeted ultrasound examination of the right breast was  performed   COMPARISON: None.   ACR Breast Density Category b: There are scattered areas of  fibroglandular density.   FINDINGS:  Full field CC and MLO views of both breasts and a spot tangential  view of the palpable concern in the RIGHT breast were obtained.   RIGHT: No findings suspicious malignancy. Normal fibroglandular  tissue extending close to the skin surface in the outer breast at  the site of palpable concern.   Targeted ultrasound is performed  in the area of palpable concern,  demonstrating an irregular hypoechoic mass with angular margins  involving a subcutaneous fat lobule at 9:30 o'clock 9 cm from nipple  measuring approximately 1.3 x 0.9 x 0.9 cm, demonstrating peripheral  power Doppler flow and slight posterior acoustic enhancement. The  involved subcutaneous fat lobule is  hyperechoic.   Sonographic evaluation of the RIGHT axilla a solitary lymph node  with focal cortical thickening up to 0.6 cm.   On correlative physical examination, there is a palpable approximate  1 cm superficial mass in the outer breast corresponding to what the  patient is feeling and the sonographic abnormality.   LEFT: No findings suspicious for malignancy.   IMPRESSION:  1. Indeterminate 1.3 cm mass involving a subcutaneous fat lobule in  the UPPER OUTER QUADRANT of the RIGHT breast at 9:30 o'clock 9 cm  from the nipple. This mass is inconspicuous on mammography. While  this mass may represent benign fat necrosis in the absence of a  mammographic abnormality, tissue sampling is warranted based on its  morphology.  2. Solitary abnormal RIGHT axillary lymph node with focal cortical  thickening up to 0.6 cm.   RECOMMENDATION:  Ultrasound-guided core needle biopsy of the RIGHT breast mass and  the solitary abnormal RIGHT axillary lymph node.   I have discussed the findings and recommendations with the patient.  The ultrasound core needle biopsy procedure was discussed with the  patient and her questions were answered. She wishes proceed with the  biopsy which has been scheduled by the Breast Imaging staff.   BI-RADS CATEGORY 4: Suspicious.   REPORT OF SURGICAL PATHOLOGY   Accession #: [id number]  Patient Name: Kara Webb  Visit # : [id number]   MRN: [id number]  Physician: Annia Belt  DOB/Age [birth year] (Age: 55) Gender: F  Collected Date: 12/18/2022  Received Date: 12/18/2022   FINAL DIAGNOSIS   1. Breast, right, needle core biopsy, 9:30 o'clock, 9cmfn (coil) :  - INVASIVE DUCTAL CARCINOMA WITH A PROMINENT LYMPHOPLASMACYTIC RESPONSE.  - TUBULE FORMATION: SCORE 3/3  - NUCLEAR PLEOMORPHISM: SCORE 3/3  - MITOTIC COUNT: SCORE 1/3  - TOTAL SCORE: 7/9  - OVERALL GRADE: II/III  - LYMPHOVASCULAR INVASION: NOT IDENTIFIED  - CANCER LENGTH: 3 MM IN GREATEST LINEAR  DIMENSION ON HEAVILY FRAGMENTED CORES.  - CALCIFICATIONS: NOT IDENTIFIED  - OTHER FINDINGS: N/A  NOTE:  DR. SMIR REVIEWED THE CASE AND CONCURS WITH THE INTERPRETATION. A BREAST  PROGNOSTIC PROFILE (ER, PR, KI-67 AND HER2) IS PENDING AND WILL BE REPORTED IN  AN ADDENDUM. THE [facility]AS NOTIFIED ON 12/19/2022.   2. Lymph node, needle/core biopsy, right axilla (hydromark) :  - METASTATIC CARCINOMA MORPHOLOGICALLY CONSISTENT WITH PART 1.  NOTE: THE GREATEST DIMENSION IS 3 MM AND THERE IS FOCAL QUESTIONABLE  EXTRACAPSULAR INVASION PRESENT.   ELECTRONIC SIGNATURE : Francene Castle, Mark, Pathologist, Electronic Signature   MICROSCOPIC DESCRIPTION   CASE COMMENTS  STAINS USED IN DIAGNOSIS:  H&E-2  H&E-3  H&E-4  H&E  *RECUT 1 SLIDE  H&E  Stains used in diagnosis 1 Her2 by IHC, 1 ER-ACIS, 1 KI-67-ACIS, 1 PR-ACIS  IHC scores are reported using ASCO/CAP scoring criteria. An IHC Score of 0 or  1+ is NEGATIVE for HER2, 3+ is POSITIVE for HER2, and 2+ is EQUIVOCAL.  Equivocal results are reflexed to either FISH or IHC testing. Specimens are  fixed in 10% Neutral Buffered Formalin for at least 6 hours and up to 72 hours.  These tests have  not be validated on decalcified tissue. Results should be  interpreted with caution given the possibility of false negative results on  decalcified specimens. Antibody Clone for HER2 is 4B5 ([facility]WAY). Some of these  immunohistochemical stains may have been developed and the performance  characteristics determined by [facility]. Some may not have been  cleared or approved by the U.S. Food and Drug Administration. The FDA has  determined that such clearance or approval is not necessary. This test is used  for clinical purposes. It should not be regarded as investigational or for  research. This laboratory is certified under the Clinical Laboratory  Improvement Amendments of 1988 (CLIA-88) as qualified to perform high complexity   clinical laboratory testing.  Estrogen receptor (6F11), immunohistochemical stains are performed on formalin  fixed, paraffin embedded tissue using a 3,3"-diaminobenzidine (DAB) chromogen  and Leica Bond Autostainer System. The staining intensity of the nucleus is  scored manually and is reported as the percentage of tumor cell nuclei  demonstrating specific nuclear staining.Specimens are fixed in 10% Neutral  Buffered Formalin for at least 6 hours and up to 72 hours. These tests have not  be validated on decalcified tissue. Results should be interpreted with caution  given the possibility of false negative results on decalcified specimens.  Ki-67 (MM1), immunohistochemical stains are performed on formalin fixed,  paraffin embedded tissue using a 3,3"-diaminobenzidine (DAB) chromogen and Leica  Bond Autostainer System. The staining intensity of the nucleus is scored  manually and is reported as the percentage of tumor cell nuclei demonstrating  specific nuclear staining.Specimens are fixed in 10% Neutral Buffered Formalin  for at least 6 hours and up to 72 hours. These tests have not be validated on  decalcified tissue. Results should be interpreted with caution given the  possibility of false negative results on decalcified specimens.  PR progesterone receptor (16), immunohistochemical stains are performed on  formalin fixed, paraffin embedded tissue using a 3,3"-diaminobenzidine (DAB)  chromogen and Leica Bond Autostainer System. The staining intensity of the  nucleus is scored manually and is reported as the percentage of tumor cell  nuclei demonstrating specific nuclear staining.Specimens are fixed in 10%  Neutral Buffered Formalin for at least 6 hours and up to 72 hours. These tests  have not be validated on decalcified tissue. Results should be interpreted with  caution given the possibility of false negative results on decalcified  specimens.   ADDENDUM  Breast, right, needle  core biopsy, 9:30 o'clock, 9cmfn (coil)  PROGNOSTIC INDICATORS   Results:  IMMUNOHISTOCHEMICAL AND MORPHOMETRIC ANALYSIS PERFORMED MANUALLY  The tumor cells are positive for Her2 (3+).  Estrogen Receptor: 0%, NEGATIVE  Progesterone Receptor: 0%, NEGATIVE  Proliferation Marker Ki67: 30%  COMMENT: The negative hormone receptor study(ies) in this case has an internal positive control.   REFERENCE RANGE ESTROGEN RECEPTOR  NEGATIVE 0%  POSITIVE =>1%  REFERENCE RANGE PROGESTERONE RECEPTOR  NEGATIVE 0%  POSITIVE =>1%  All controls stained appropriately  Lance Coon Md, Pathologist, Electronic Signature  ( Signed 12 05 2024)   CLINICAL HISTORY   SPECIMEN(S) OBTAINED  1. Breast, right, needle core biopsy, 9:30 O'clock, 9cmfn (coil)  2. Lymph node, needle/core biopsy, Right Axilla (hydromark)   SPECIMEN COMMENTS:  1. TIF: 8:15 AM, CIT < 30 sec; mass  2. TIF: 8:20 AM, CIT < 30 sec; node  SPECIMEN CLINICAL INFORMATION:  1. Fat necrosis vs IDC  2. Reactive vs mets   Gross Description  1. The specimen is received in formalin labeled  with the patient's name, and  right breast 9:30 o'clock 9 cm fn, and consists of three cores of tan yellow  fibroadipose tissue ranging from 1.3 x 0.2 x 0.2 cm to 1.8 x 0.2 x 0.2 cm.The  specimen is entirely submitted in one cassette.  Time in formalin 8:15 a.m. on 12/18/22. CIT less than 30 seconds.  2. The specimen is received in formalin labeled with the patient's name and  right axilla, and consists of multiple core and core fragments of tan white soft  tissue, ranging from 0.1 cm to 1.0 x 0.1 x 0.1 cm.The specimen is entirely  submitted in one cassette.  Time in formalin 8:20 a.m. on 12/18/22. CIT less than 30 seconds. (KL:kh  12/18/22)   Assessment and Plan:   Diagnoses and all orders for this visit:  Malignant neoplasm of upper-outer quadrant of right breast in female, estrogen receptor negative (CMS/HHS-HCC)   Reviewed pathology and findings.  She will benefit from neoadjuvant chemotherapy therefore we will place a Port-A-Cath with the plan for breast conserving surgery which will involve lumpectomy and targeted lymph node biopsy in the future. Risks and benefits reviewed. Patient seen by medical and radiation oncology today. Risk of port placement include bleeding, infection, pneumothorax, hemothorax, injury to major vascular structure, injury to structures in chest, catheter migration, catheter malfunction, catheter separation and the need for other additional treatments and/or procedures. She wishes to proceed.   Hayden Rasmussen, MD

## 2023-01-03 NOTE — Interval H&P Note (Signed)
History and Physical Interval Note:  01/03/2023 8:22 AM  Kara Webb  has presented today for surgery, with the diagnosis of POOR VENOUS ACCESS.  The various methods of treatment have been discussed with the patient and family. After consideration of risks, benefits and other options for treatment, the patient has consented to  Procedure(s): INSERTION PORT-A-CATH WITH ULTRASOUND GUIDEANCE (N/A) as a surgical intervention.  The patient's history has been reviewed, patient examined, no change in status, stable for surgery.  I have reviewed the patient's chart and labs.  Questions were answered to the patient's satisfaction.   Reviewed port placement.  Risk of bleeding, infection, collapsed lung, bleeding around the lung, mediastinal injury, nerve injury, catheter migration, catheter malfunction, catheter embolization, and the need further interventions and/or treatments.  Kara Webb A Oveda Dadamo

## 2023-01-03 NOTE — Telephone Encounter (Addendum)
Patient returned call, stated she currently has BCBS through her employer with a deductible greater than $5,000, but unfortunately did not qualify for [place] medicaid due to income guidelines.    Left message on voicemail requesting return call regarding [employer] Medicaid.

## 2023-01-03 NOTE — Transfer of Care (Signed)
Immediate Anesthesia Transfer of Care Note  Patient: Kara Webb  Procedure(s) Performed: INSERTION PORT-A-CATH WITH ULTRASOUND GUIDEANCE (Chest)  Patient Location: PACU  Anesthesia Type:General  Level of Consciousness: drowsy and patient cooperative  Airway & Oxygen Therapy: Patient Spontanous Breathing and Patient connected to face mask oxygen  Post-op Assessment: Report given to RN and Post -op Vital signs reviewed and stable  Post vital signs: Reviewed and stable  Last Vitals:  Vitals Value Taken Time  BP    Temp    Pulse 81 01/03/23 0927  Resp 33 01/03/23 0927  SpO2 98 % 01/03/23 0927  Vitals shown include unfiled device data.  Last Pain:  Vitals:   01/03/23 0728  TempSrc: Oral  PainSc: 0-No pain      Patients Stated Pain Goal: 4 (01/03/23 0728)  Complications: No notable events documented.

## 2023-01-03 NOTE — Anesthesia Procedure Notes (Signed)
Procedure Name: LMA Insertion Date/Time: 01/03/2023 8:33 AM  Performed by: Thornell Mule, CRNAPre-anesthesia Checklist: Patient identified, Emergency Drugs available, Suction available and Patient being monitored Patient Re-evaluated:Patient Re-evaluated prior to induction Oxygen Delivery Method: Circle system utilized Preoxygenation: Pre-oxygenation with 100% oxygen Induction Type: IV induction Ventilation: Mask ventilation without difficulty LMA: LMA inserted LMA Size: 4.0 Number of attempts: 1 Placement Confirmation: positive ETCO2 Tube secured with: Tape Dental Injury: Teeth and Oropharynx as per pre-operative assessment

## 2023-01-03 NOTE — Telephone Encounter (Signed)
Notified patient that her [employer] FMLA Documents had been completed and faxed back to company. Fax confirmation received. Copy of documents placed up front for pick up at next visit. No further concerns at this time.

## 2023-01-03 NOTE — Op Note (Addendum)
preoperative diagnosis: PAC needed for chemotherapy secondary to breast cancer  Postoperative diagnosis: Same  Procedure: 8 French Bard Portacath Placement with C arm and ultrasound guidance  Surgeon: Dortha Schwalbe, MD, FACS  Anesthesia: General and 0.25 % marcaine with epinephrine  Clinical History and Indications: The patient is getting ready to begin chemotherapy for her cancer. She  needs a Port-A-Cath for venous access. Risk of bleeding, infection,  Collapse lung,  Death,  DVT,  Organ injury,  Mediastinal injury,  Injury to heart,  Injury to blood vessels,  Nerves,  Migration of catheter,  Embolization of catheter and the need for more surgery.  Description of Procedure: I have seen the patient in the holding area and confirmed the plans for the procedure as noted above. I reviewed the risks and complications again and the patient has no further questions. She wishes to proceed.   The patient was then taken to the operating room. After satisfactory general  anesthesia had been obtained the upper chest and lower neck were prepped and draped as a sterile field. The timeout was done.  The right internal jugular vein  was entered under U/S guidance  and the guidewire threaded into the superior vena cava right atrial area under fluoroscopic guidance. An incision was then made on the anterior chest wall and a subcutaneous pocket fashioned for the port reservoir.  The port tubing was then brought through a subcutaneous tunnel from the port site to the guidewire site.  The port and catheter were attached, locked  and flushed. The catheter was measured and cut to appropriate length.The dilator and peel-away sheath were then advanced over the guidewire while monitoring this with fluoroscopy. The guidewire and dilator were removed and the tubing threaded to approximately 21 cm. The peel-away sheath was then removed. The catheter aspirated and flushed easily. Using fluoroscopy the tip was in the  superior vena cava right atrial junction area. It aspirated and flushed easily. That aspirated and flushed easily.  The reservoir was secured to the fascia with 1 sutures of 2-0 Prolene. A final check with fluoroscopy was done to make sure we had no kinks and good positioning of the tip of the catheter. Everything appeared to be okay. The catheter was aspirated, flushed with dilute heparin and then concentrated aqueous heparin.   C arm images obtained.  The tip is at the cavoatrial junction.  No evidence of pneumothorax.  The catheter is ready for use.    The incision was then closed with interrupted 3-0 Vicryl, and 4-0 Monocryl subcuticular with Dermabond on the skin.  There were no operative complications. Estimated blood loss was minimal. All counts were correct. The patient tolerated the procedure well.  Dortha Schwalbe, MD, FACS

## 2023-01-03 NOTE — Anesthesia Postprocedure Evaluation (Signed)
Anesthesia Post Note  Patient: Kara Webb  Procedure(s) Performed: INSERTION PORT-A-CATH WITH ULTRASOUND GUIDEANCE (Chest)     Patient location during evaluation: PACU Anesthesia Type: General Level of consciousness: sedated and patient cooperative Pain management: pain level controlled Vital Signs Assessment: post-procedure vital signs reviewed and stable Respiratory status: spontaneous breathing Cardiovascular status: stable Anesthetic complications: no   No notable events documented.  Last Vitals:  Vitals:   01/03/23 0945 01/03/23 1023  BP: (!) 139/90 (!) 137/92  Pulse: 76 81  Resp: 14 18  Temp:  (!) 36.3 C  SpO2: 100% 94%    Last Pain:  Vitals:   01/03/23 0945  TempSrc:   PainSc: 3                  Lewie Loron

## 2023-01-04 ENCOUNTER — Encounter (HOSPITAL_BASED_OUTPATIENT_CLINIC_OR_DEPARTMENT_OTHER): Payer: Self-pay | Admitting: Surgery

## 2023-01-04 ENCOUNTER — Telehealth: Payer: Self-pay

## 2023-01-04 NOTE — Telephone Encounter (Signed)
W0981, ICE COMPRESS: RANDOMIZED TRIAL OF LIMB CRYOCOMPRESSION VERSUS CONTINUOUS COMPRESSION VERSUS LOW CYCLIC COMPRESSION FOR THE PREVENTION  OF TAXANE-INDUCED PERIPHERAL NEUROPATHY  Research RN Victorino Dike called pt to follow up with her on her participation in this research trial. Pt stated in this phone call that being wrapped for several hours during treatment would be challenging for her. Rn expressed her gratitude for the pt considering this study. Rn also discussed DCP-001 with pt during this conversation. She was willing to consider this data study as well. RN and pt agreed to discuss this during her infusion time on 02-02-23. The pt felt that discussing this during her first treatment might be too much at one time. Rn agreed and will meet with pt on Cycle 2. Rn once again thanked pt for her time and wished her well. Rn did encourage pt to call with any questions or concerns.   Zerita Boers BSN RN Clinical Research Nurse [facility] Direct Dial: [phone] 01/04/2023  10:23 AM

## 2023-01-05 NOTE — Progress Notes (Signed)
Pharmacist Chemotherapy Monitoring - Initial Assessment    Anticipated start date: 01/12/23   The following has been reviewed per standard work regarding the patient's treatment regimen: The patient's diagnosis, treatment plan and drug doses, and organ/hematologic function Lab orders and baseline tests specific to treatment regimen  The treatment plan start date, drug sequencing, and pre-medications Prior authorization status  Patient's documented medication list, including drug-drug interaction screen and prescriptions for anti-emetics and supportive care specific to the treatment regimen The drug concentrations, fluid compatibility, administration routes, and timing of the medications to be used The patient's access for treatment and lifetime cumulative dose history, if applicable  The patient's medication allergies and previous infusion related reactions, if applicable   Changes made to treatment plan:  N/A  Follow up needed:  Pending authorization for treatment    Jerry Caras, PharmD PGY2 Oncology Pharmacy Resident   01/05/2023 12:22 PM

## 2023-01-08 ENCOUNTER — Telehealth: Payer: Self-pay | Admitting: *Deleted

## 2023-01-08 ENCOUNTER — Ambulatory Visit
Admission: RE | Admit: 2023-01-08 | Discharge: 2023-01-08 | Disposition: A | Discharge status: Home / Self Care | Payer: BC Managed Care – PPO | Source: Ambulatory Visit | Attending: Hematology and Oncology | Admitting: Hematology and Oncology

## 2023-01-08 ENCOUNTER — Other Ambulatory Visit: Payer: Self-pay | Admitting: *Deleted

## 2023-01-08 DIAGNOSIS — Z171 Estrogen receptor negative status [ER-]: Secondary | ICD-10-CM

## 2023-01-08 DIAGNOSIS — D0511 Intraductal carcinoma in situ of right breast: Secondary | ICD-10-CM | POA: Diagnosis not present

## 2023-01-08 DIAGNOSIS — R928 Other abnormal and inconclusive findings on diagnostic imaging of breast: Secondary | ICD-10-CM

## 2023-01-08 MED ORDER — GADOPICLENOL 0.5 MMOL/ML IV SOLN
10.0000 mL | Freq: Once | INTRAVENOUS | Status: AC | PRN
Start: 2023-01-08 — End: 2023-01-08
  Administered 2023-01-08: 10 mL via INTRAVENOUS

## 2023-01-08 NOTE — Telephone Encounter (Signed)
Patient had a question about when her last cheo treatment would be and how soon does the port come out after chemo, told her I would get with her team and find out

## 2023-01-08 NOTE — Telephone Encounter (Signed)
Spoke with patient to follow up from some questions she had regarding her appts. And her EMLA cream.  Patient verbalizes understanding. Encouraged her to call should she have any other questions.

## 2023-01-08 NOTE — Telephone Encounter (Signed)
Spoke with patient regarding her MRI results and the need for an add bx.  Patient was very surprised by this and upset.  Explained to her that this will not change her treatment and we will get this scheduled asap.  Orders placed.  Gave emotional support and encouraged her to call with any questions she has.

## 2023-01-11 ENCOUNTER — Other Ambulatory Visit: Payer: Self-pay | Admitting: Hematology and Oncology

## 2023-01-11 ENCOUNTER — Encounter: Payer: Self-pay | Admitting: *Deleted

## 2023-01-11 DIAGNOSIS — R928 Other abnormal and inconclusive findings on diagnostic imaging of breast: Secondary | ICD-10-CM

## 2023-01-11 DIAGNOSIS — Z171 Estrogen receptor negative status [ER-]: Secondary | ICD-10-CM

## 2023-01-11 MED FILL — Fosaprepitant Dimeglumine For IV Infusion 150 MG (Base Eq): INTRAVENOUS | Qty: 5 | Status: AC

## 2023-01-12 ENCOUNTER — Encounter: Payer: Self-pay | Admitting: Hematology and Oncology

## 2023-01-12 ENCOUNTER — Encounter: Payer: Self-pay | Admitting: Genetic Counselor

## 2023-01-12 ENCOUNTER — Inpatient Hospital Stay: Payer: BC Managed Care – PPO

## 2023-01-12 ENCOUNTER — Ambulatory Visit (HOSPITAL_BASED_OUTPATIENT_CLINIC_OR_DEPARTMENT_OTHER): Payer: BC Managed Care – PPO | Admitting: Physician Assistant

## 2023-01-12 VITALS — BP 129/87 | HR 78 | Temp 98.5°F | Resp 18 | Ht 66.0 in | Wt 289.5 lb

## 2023-01-12 VITALS — BP 138/87 | HR 92 | Resp 16

## 2023-01-12 DIAGNOSIS — Z1379 Encounter for other screening for genetic and chromosomal anomalies: Secondary | ICD-10-CM | POA: Insufficient documentation

## 2023-01-12 DIAGNOSIS — Z5111 Encounter for antineoplastic chemotherapy: Secondary | ICD-10-CM | POA: Diagnosis not present

## 2023-01-12 DIAGNOSIS — Z5189 Encounter for other specified aftercare: Secondary | ICD-10-CM | POA: Diagnosis not present

## 2023-01-12 DIAGNOSIS — R232 Flushing: Secondary | ICD-10-CM | POA: Diagnosis not present

## 2023-01-12 DIAGNOSIS — Z5112 Encounter for antineoplastic immunotherapy: Secondary | ICD-10-CM | POA: Diagnosis not present

## 2023-01-12 DIAGNOSIS — T50905A Adverse effect of unspecified drugs, medicaments and biological substances, initial encounter: Secondary | ICD-10-CM

## 2023-01-12 DIAGNOSIS — Z95828 Presence of other vascular implants and grafts: Secondary | ICD-10-CM

## 2023-01-12 DIAGNOSIS — Z171 Estrogen receptor negative status [ER-]: Secondary | ICD-10-CM

## 2023-01-12 DIAGNOSIS — Z800 Family history of malignant neoplasm of digestive organs: Secondary | ICD-10-CM | POA: Diagnosis not present

## 2023-01-12 DIAGNOSIS — C50411 Malignant neoplasm of upper-outer quadrant of right female breast: Secondary | ICD-10-CM | POA: Diagnosis not present

## 2023-01-12 DIAGNOSIS — Z803 Family history of malignant neoplasm of breast: Secondary | ICD-10-CM | POA: Diagnosis not present

## 2023-01-12 LAB — CBC WITH DIFFERENTIAL (CANCER CENTER ONLY)
Abs Immature Granulocytes: 0.05 10*3/uL (ref 0.00–0.07)
Basophils Absolute: 0 10*3/uL (ref 0.0–0.1)
Basophils Relative: 0 %
Eosinophils Absolute: 0 10*3/uL (ref 0.0–0.5)
Eosinophils Relative: 0 %
HCT: 32.1 % — ABNORMAL LOW (ref 36.0–46.0)
Hemoglobin: 10.1 g/dL — ABNORMAL LOW (ref 12.0–15.0)
Immature Granulocytes: 1 %
Lymphocytes Relative: 8 %
Lymphs Abs: 0.8 10*3/uL (ref 0.7–4.0)
MCH: 22 pg — ABNORMAL LOW (ref 26.0–34.0)
MCHC: 31.5 g/dL (ref 30.0–36.0)
MCV: 69.9 fL — ABNORMAL LOW (ref 80.0–100.0)
Monocytes Absolute: 0.3 10*3/uL (ref 0.1–1.0)
Monocytes Relative: 3 %
Neutro Abs: 8.7 10*3/uL — ABNORMAL HIGH (ref 1.7–7.7)
Neutrophils Relative %: 88 %
Platelet Count: 266 10*3/uL (ref 150–400)
RBC: 4.59 MIL/uL (ref 3.87–5.11)
RDW: 17.2 % — ABNORMAL HIGH (ref 11.5–15.5)
WBC Count: 9.8 10*3/uL (ref 4.0–10.5)
nRBC: 0 % (ref 0.0–0.2)

## 2023-01-12 LAB — CMP (CANCER CENTER ONLY)
ALT: 44 U/L (ref 0–44)
AST: 22 U/L (ref 15–41)
Albumin: 3.9 g/dL (ref 3.5–5.0)
Alkaline Phosphatase: 82 U/L (ref 38–126)
Anion gap: 6 (ref 5–15)
BUN: 12 mg/dL (ref 6–20)
CO2: 25 mmol/L (ref 22–32)
Calcium: 9.6 mg/dL (ref 8.9–10.3)
Chloride: 105 mmol/L (ref 98–111)
Creatinine: 0.72 mg/dL (ref 0.44–1.00)
GFR, Estimated: >60 mL/min (ref >60)
Glucose, Bld: 128 mg/dL — ABNORMAL HIGH (ref 70–99)
Potassium: 3.9 mmol/L (ref 3.5–5.1)
Sodium: 136 mmol/L (ref 135–145)
Total Bilirubin: 0.2 mg/dL (ref <1.2)
Total Protein: 7.8 g/dL (ref 6.5–8.1)

## 2023-01-12 LAB — PREGNANCY, URINE: Preg Test, Ur: NEGATIVE

## 2023-01-12 MED ORDER — DEXAMETHASONE SODIUM PHOSPHATE 10 MG/ML IJ SOLN
10.0000 mg | Freq: Once | INTRAMUSCULAR | Status: AC
Start: 2023-01-12 — End: 2023-01-12
  Administered 2023-01-12: 10 mg via INTRAVENOUS
  Filled 2023-01-12: qty 1

## 2023-01-12 MED ORDER — TRASTUZUMAB-ANNS CHEMO 150 MG IV SOLR
8.0000 mg/kg | Freq: Once | INTRAVENOUS | Status: AC
Start: 2023-01-12 — End: 2023-01-12
  Administered 2023-01-12: 1050 mg via INTRAVENOUS
  Filled 2023-01-12: qty 50

## 2023-01-12 MED ORDER — SODIUM CHLORIDE 0.9 % IV SOLN: Freq: Once | INTRAVENOUS | Status: DC | PRN

## 2023-01-12 MED ORDER — SODIUM CHLORIDE 0.9 % IV SOLN
900.0000 mg | Freq: Once | INTRAVENOUS | Status: AC
  Administered 2023-01-12: 900 mg via INTRAVENOUS
  Filled 2023-01-12: qty 90

## 2023-01-12 MED ORDER — FAMOTIDINE IN NACL 20-0.9 MG/50ML-% IV SOLN
20.0000 mg | Freq: Once | INTRAVENOUS | Status: AC | PRN
  Administered 2023-01-12: 20 mg via INTRAVENOUS

## 2023-01-12 MED ORDER — SODIUM CHLORIDE 0.9% FLUSH
10.0000 mL | INTRAVENOUS | Status: DC | PRN
Start: 2023-01-12 — End: 2023-01-12
  Administered 2023-01-12: 10 mL

## 2023-01-12 MED ORDER — HEPARIN SOD (PORK) LOCK FLUSH 100 UNIT/ML IV SOLN
500.0000 [IU] | Freq: Once | INTRAVENOUS | Status: AC | PRN
  Administered 2023-01-12: 500 [IU]

## 2023-01-12 MED ORDER — ACETAMINOPHEN 325 MG PO TABS
650.0000 mg | ORAL_TABLET | Freq: Once | ORAL | Status: AC
  Administered 2023-01-12: 650 mg via ORAL
  Filled 2023-01-12: qty 2

## 2023-01-12 MED ORDER — DIPHENHYDRAMINE HCL 25 MG PO CAPS
50.0000 mg | ORAL_CAPSULE | Freq: Once | ORAL | Status: AC
  Administered 2023-01-12: 50 mg via ORAL
  Filled 2023-01-12: qty 2

## 2023-01-12 MED ORDER — FOSAPREPITANT DIMEGLUMINE INJECTION 150 MG
150.0000 mg | Freq: Once | INTRAVENOUS | Status: AC
  Administered 2023-01-12: 150 mg via INTRAVENOUS
  Filled 2023-01-12: qty 150

## 2023-01-12 MED ORDER — SODIUM CHLORIDE 0.9 % IV SOLN
75.0000 mg/m2 | Freq: Once | INTRAVENOUS | Status: AC
  Administered 2023-01-12: 186 mg via INTRAVENOUS
  Filled 2023-01-12: qty 18.6

## 2023-01-12 MED ORDER — SODIUM CHLORIDE 0.9% FLUSH
10.0000 mL | Freq: Once | INTRAVENOUS | Status: AC
  Administered 2023-01-12: 10 mL

## 2023-01-12 MED ORDER — SODIUM CHLORIDE 0.9 % IV SOLN
840.0000 mg | Freq: Once | INTRAVENOUS | Status: AC
  Administered 2023-01-12: 840 mg via INTRAVENOUS
  Filled 2023-01-12: qty 28

## 2023-01-12 MED ORDER — PALONOSETRON HCL INJECTION 0.25 MG/5ML
0.2500 mg | Freq: Once | INTRAVENOUS | Status: AC
  Administered 2023-01-12: 0.25 mg via INTRAVENOUS
  Filled 2023-01-12: qty 5

## 2023-01-12 MED ORDER — SODIUM CHLORIDE 0.9 % IV SOLN: INTRAVENOUS | Status: DC

## 2023-01-12 NOTE — Patient Instructions (Signed)
[facility] - A DEPT OF [facility]  Discharge Instructions: Thank you for choosing [facility] to provide your oncology and hematology care.   If you have a lab appointment with the Cancer Center, please go directly to the Cancer Center and check in at the registration area.   Wear comfortable clothing and clothing appropriate for easy access to any Portacath or PICC line.   We strive to give you quality time with your provider. You may need to reschedule your appointment if you arrive late (15 or more minutes).  Arriving late affects you and other patients whose appointments are after yours.  Also, if you miss three or more appointments without notifying the office, you may be dismissed from the clinic at the provider's discretion.      For prescription refill requests, have your pharmacy contact our office and allow 72 hours for refills to be completed.    Today you received the following chemotherapy and/or immunotherapy agents Trastuzumab, Perjeta, Docetaxel, Carboplatin   To help prevent nausea and vomiting after your treatment, we encourage you to take your nausea medication as directed.  BELOW ARE SYMPTOMS THAT SHOULD BE REPORTED IMMEDIATELY: *FEVER GREATER THAN 100.4 F (38 C) OR HIGHER *CHILLS OR SWEATING *NAUSEA AND VOMITING THAT IS NOT CONTROLLED WITH YOUR NAUSEA MEDICATION *UNUSUAL SHORTNESS OF BREATH *UNUSUAL BRUISING OR BLEEDING *URINARY PROBLEMS (pain or burning when urinating, or frequent urination) *BOWEL PROBLEMS (unusual diarrhea, constipation, pain near the anus) TENDERNESS IN MOUTH AND THROAT WITH OR WITHOUT PRESENCE OF ULCERS (sore throat, sores in mouth, or a toothache) UNUSUAL RASH, SWELLING OR PAIN  UNUSUAL VAGINAL DISCHARGE OR ITCHING   Items with * indicate a potential emergency and should be followed up as soon as possible or go to the Emergency Department if any problems should occur.  Please show the  CHEMOTHERAPY ALERT CARD or IMMUNOTHERAPY ALERT CARD at check-in to the Emergency Department and triage nurse.  Should you have questions after your visit or need to cancel or reschedule your appointment, please contact [facility] - [facility]  Dept: [phone]  and follow the prompts.  Office hours are 8:00 a.m. to 4:30 p.m. Monday - Friday. Please note that voicemails left after 4:00 p.m. may not be returned until the following business day.  We are closed weekends and major holidays. You have access to a nurse at all times for urgent questions. Please call the main number to the clinic Dept: [phone] and follow the prompts.   For any non-urgent questions, you may also contact your provider using MyChart. We now offer e-Visits for anyone 7 and older to request care online for non-urgent symptoms. For details visit [email or web address].   Also download the MyChart app! Go to the app store, search "MyChart", open the app, select [place], and log in with your MyChart username and password.  Trastuzumab Injection What is this medication? TRASTUZUMAB (tras TOO zoo mab) treats breast cancer and stomach cancer. It works by blocking a protein that causes cancer cells to grow and multiply. This helps to slow or stop the spread of cancer cells. This medicine may be used for other purposes; ask your health care provider or pharmacist if you have questions. COMMON BRAND NAME(S): Herceptin, Marlowe Alt, Ontruzant, Trazimera What should I tell my care team before I take this medication? They need to know if you have any of these  conditions: Heart failure Lung disease An unusual or allergic reaction to trastuzumab, other medications, foods, dyes, or preservatives Pregnant or trying to get pregnant Breast-feeding How should I use this medication? This medication is injected into a vein. It is given by your care team in a hospital or  clinic setting. Talk to your care team about the use of this medication in children. It is not approved for use in children. Overdosage: If you think you have taken too much of this medicine contact a poison control center or emergency room at once. NOTE: This medicine is only for you. Do not share this medicine with others. What if I miss a dose? Keep appointments for follow-up doses. It is important not to miss your dose. Call your care team if you are unable to keep an appointment. What may interact with this medication? Certain types of chemotherapy, such as daunorubicin, doxorubicin, epirubicin, idarubicin This list may not describe all possible interactions. Give your health care provider a list of all the medicines, herbs, non-prescription drugs, or dietary supplements you use. Also tell them if you smoke, drink alcohol, or use illegal drugs. Some items may interact with your medicine. What should I watch for while using this medication? Your condition will be monitored carefully while you are receiving this medication. This medication may make you feel generally unwell. This is not uncommon, as chemotherapy affects healthy cells as well as cancer cells. Report any side effects. Continue your course of treatment even though you feel ill unless your care team tells you to stop. This medication may increase your risk of getting an infection. Call your care team for advice if you get a fever, chills, sore throat, or other symptoms of a cold or flu. Do not treat yourself. Try to avoid being around people who are sick. Avoid taking medications that contain aspirin, acetaminophen, ibuprofen, naproxen, or ketoprofen unless instructed by your care team. These medications can hide a fever. Talk to your care team if you may be pregnant. Serious birth defects can occur if you take this medication during pregnancy and for 7 months after the last dose. You will need a negative pregnancy test before starting  this medication. Contraception is recommended while taking this medication and for 7 months after the last dose. Your care team can help you find the option that works for you. Do not breastfeed while taking this medication and for 7 months after stopping treatment. What side effects may I notice from receiving this medication? Side effects that you should report to your care team as soon as possible: Allergic reactions or angioedema--skin rash, itching or hives, swelling of the face, eyes, lips, tongue, arms, or legs, trouble swallowing or breathing Dry cough, shortness of breath or trouble breathing Heart failure--shortness of breath, swelling of the ankles, feet, or hands, sudden weight gain, unusual weakness or fatigue Infection--fever, chills, cough, or sore throat Infusion reactions--chest pain, shortness of breath or trouble breathing, feeling faint or lightheaded Side effects that usually do not require medical attention (report to your care team if they continue or are bothersome): Diarrhea Dizziness Headache Nausea Trouble sleeping Vomiting This list may not describe all possible side effects. Call your doctor for medical advice about side effects. You may report side effects to FDA at 1-800-FDA-1088. Where should I keep my medication? This medication is given in a hospital or clinic. It will not be stored at home. NOTE: This sheet is a summary. It may not cover all possible information. If  you have questions about this medicine, talk to your doctor, pharmacist, or health care provider.  2024 Elsevier/Gold Standard (2021-05-17 00:00:00)  Pertuzumab Injection What is this medication? PERTUZUMAB (per TOOZ ue mab) treats breast cancer. It works by blocking a protein that causes cancer cells to grow and multiply. This helps to slow or stop the spread of cancer cells. It is a monoclonal antibody. This medicine may be used for other purposes; ask your health care provider or pharmacist  if you have questions. COMMON BRAND NAME(S): PERJETA What should I tell my care team before I take this medication? They need to know if you have any of these conditions: Heart failure An unusual or allergic reaction to pertuzumab, other medications, foods, dyes, or preservatives Pregnant or trying to get pregnant Breast-feeding How should I use this medication? This medication is injected into a vein. It is given by your care team in a hospital or clinic setting. Talk to your care team about the use of this medication in children. Special care may be needed. Overdosage: If you think you have taken too much of this medicine contact a poison control center or emergency room at once. NOTE: This medicine is only for you. Do not share this medicine with others. What if I miss a dose? Keep appointments for follow-up doses. It is important not to miss your dose. Call your care team if you are unable to keep an appointment. What may interact with this medication? Interactions are not expected. This list may not describe all possible interactions. Give your health care provider a list of all the medicines, herbs, non-prescription drugs, or dietary supplements you use. Also tell them if you smoke, drink alcohol, or use illegal drugs. Some items may interact with your medicine. What should I watch for while using this medication? Your condition will be monitored carefully while you are receiving this medication. This medication may make you feel generally unwell. This is not uncommon as chemotherapy can affect healthy cells as well as cancer cells. Report any side effects. Continue your course of treatment even though you feel ill unless your care team tells you to stop. Talk to your care team if you may be pregnant. Serious birth defects can occur if you take this medication during pregnancy and for 7 months after the last dose. You will need a negative pregnancy test before starting this medication.  Contraception is recommended while taking this medication and for 7 months after the last dose. Your care team can help you find the option that works for you. Do not breastfeed while taking this medication and for 7 months after the last dose. What side effects may I notice from receiving this medication? Side effects that you should report to your care team as soon as possible: Allergic reactions or angioedema--skin rash, itching or hives, swelling of the face, eyes, lips, tongue, arms, or legs, trouble swallowing or breathing Heart failure--shortness of breath, swelling of the ankles, feet, or hands, sudden weight gain, unusual weakness or fatigue Infusion reactions--chest pain, shortness of breath or trouble breathing, feeling faint or lightheaded Side effects that usually do not require medical attention (report to your care team if they continue or are bothersome): Diarrhea Dry skin Fatigue Hair loss Nausea Vomiting This list may not describe all possible side effects. Call your doctor for medical advice about side effects. You may report side effects to FDA at 1-800-FDA-1088. Where should I keep my medication? This medication is given in a hospital or clinic.  It will not be stored at home. NOTE: This sheet is a summary. It may not cover all possible information. If you have questions about this medicine, talk to your doctor, pharmacist, or health care provider.  2024 Elsevier/Gold Standard (2021-05-17 00:00:00)  Docetaxel Injection What is this medication? DOCETAXEL (doe se TAX el) treats some types of cancer. It works by slowing down the growth of cancer cells. This medicine may be used for other purposes; ask your health care provider or pharmacist if you have questions. COMMON BRAND NAME(S): Docefrez, Docivyx, Taxotere What should I tell my care team before I take this medication? They need to know if you have any of these conditions: Kidney disease Liver disease Low white  blood cell levels Tingling of the fingers or toes or other nerve disorder An unusual or allergic reaction to docetaxel, polysorbate 80, other medications, foods, dyes, or preservatives Pregnant or trying to get pregnant Breast-feeding How should I use this medication? This medication is injected into a vein. It is given by your care team in a hospital or clinic setting. Talk to your care team about the use of this medication in children. Special care may be needed. Overdosage: If you think you have taken too much of this medicine contact a poison control center or emergency room at once. NOTE: This medicine is only for you. Do not share this medicine with others. What if I miss a dose? Keep appointments for follow-up doses. It is important not to miss your dose. Call your care team if you are unable to keep an appointment. What may interact with this medication? Do not take this medication with any of the following: Live virus vaccines This medication may also interact with the following: Certain antibiotics, such as clarithromycin, telithromycin Certain antivirals for HIV or hepatitis Certain medications for fungal infections, such as itraconazole, ketoconazole, voriconazole Grapefruit juice Nefazodone Supplements, such as St. John's wort This list may not describe all possible interactions. Give your health care provider a list of all the medicines, herbs, non-prescription drugs, or dietary supplements you use. Also tell them if you smoke, drink alcohol, or use illegal drugs. Some items may interact with your medicine. What should I watch for while using this medication? This medication may make you feel generally unwell. This is not uncommon as chemotherapy can affect healthy cells as well as cancer cells. Report any side effects. Continue your course of treatment even though you feel ill unless your care team tells you to stop. You may need blood work done while you are taking this  medication. This medication can cause serious side effects and infusion reactions. To reduce the risk, your care team may give you other medications to take before receiving this one. Be sure to follow the directions from your care team. This medication may increase your risk of getting an infection. Call your care team for advice if you get a fever, chills, sore throat, or other symptoms of a cold or flu. Do not treat yourself. Try to avoid being around people who are sick. Avoid taking medications that contain aspirin, acetaminophen, ibuprofen, naproxen, or ketoprofen unless instructed by your care team. These medications may hide a fever. Be careful brushing or flossing your teeth or using a toothpick because you may get an infection or bleed more easily. If you have any dental work done, tell your dentist you are receiving this medication. Some products may contain alcohol. Ask your care team if this medication contains alcohol. Be sure to  tell all care teams you are taking this medicine. Certain medications, like metronidazole and disulfiram, can cause an unpleasant reaction when taken with alcohol. The reaction includes flushing, headache, nausea, vomiting, sweating, and increased thirst. The reaction can last from 30 minutes to several hours. This medication may affect your coordination, reaction time, or judgement. Do not drive or operate machinery until you know how this medication affects you. Sit up or stand slowly to reduce the risk of dizzy or fainting spells. Drinking alcohol with this medication can increase the risk of these side effects. Talk to your care team about your risk of cancer. You may be more at risk for certain types of cancer if you take this medication. Talk to your care team if you wish to become pregnant or think you might be pregnant. This medication can cause serious birth defects if taken during pregnancy or if you get pregnant within 2 months after stopping therapy. A  negative pregnancy test is required before starting this medication. A reliable form of contraception is recommended while taking this medication and for 2 months after stopping it. Talk to your care team about reliable forms of contraception. Do not breast-feed while taking this medication and for 1 week after stopping therapy. Use a condom during sex and for 4 months after stopping therapy. Tell your care team right away if you think your partner might be pregnant. This medication can cause serious birth defects. This medication may cause infertility. Talk to your care team if you are concerned about your fertility. What side effects may I notice from receiving this medication? Side effects that you should report to your care team as soon as possible: Allergic reactions--skin rash, itching, hives, swelling of the face, lips, tongue, or throat Change in vision such as blurry vision, seeing halos around lights, vision loss Infection--fever, chills, cough, or sore throat Infusion reactions--chest pain, shortness of breath or trouble breathing, feeling faint or lightheaded Low red blood cell level--unusual weakness or fatigue, dizziness, headache, trouble breathing Pain, tingling, or numbness in the hands or feet Painful swelling, warmth, or redness of the skin, blisters or sores at the infusion site Redness, blistering, peeling, or loosening of the skin, including inside the mouth Sudden or severe stomach pain, bloody diarrhea, fever, nausea, vomiting Swelling of the ankles, hands, or feet Tumor lysis syndrome (TLS)--nausea, vomiting, diarrhea, decrease in the amount of urine, dark urine, unusual weakness or fatigue, confusion, muscle pain or cramps, fast or irregular heartbeat, joint pain Unusual bruising or bleeding Side effects that usually do not require medical attention (report to your care team if they continue or are bothersome): Change in nail shape, thickness, or color Change in  taste Hair loss Increased tears This list may not describe all possible side effects. Call your doctor for medical advice about side effects. You may report side effects to FDA at 1-800-FDA-1088. Where should I keep my medication? This medication is given in a hospital or clinic. It will not be stored at home. NOTE: This sheet is a summary. It may not cover all possible information. If you have questions about this medicine, talk to your doctor, pharmacist, or health care provider.  2024 Elsevier/Gold Standard (2021-03-10 00:00:00)  Carboplatin Injection What is this medication? CARBOPLATIN (KAR boe pla tin) treats some types of cancer. It works by slowing down the growth of cancer cells. This medicine may be used for other purposes; ask your health care provider or pharmacist if you have questions. COMMON BRAND NAME(S): Paraplatin  What should I tell my care team before I take this medication? They need to know if you have any of these conditions: Blood disorders Hearing problems Kidney disease Recent or ongoing radiation therapy An unusual or allergic reaction to carboplatin, cisplatin, other medications, foods, dyes, or preservatives Pregnant or trying to get pregnant Breast-feeding How should I use this medication? This medication is injected into a vein. It is given by your care team in a hospital or clinic setting. Talk to your care team about the use of this medication in children. Special care may be needed. Overdosage: If you think you have taken too much of this medicine contact a poison control center or emergency room at once. NOTE: This medicine is only for you. Do not share this medicine with others. What if I miss a dose? Keep appointments for follow-up doses. It is important not to miss your dose. Call your care team if you are unable to keep an appointment. What may interact with this medication? Medications for seizures Some antibiotics, such as amikacin, gentamicin,  neomycin, streptomycin, tobramycin Vaccines This list may not describe all possible interactions. Give your health care provider a list of all the medicines, herbs, non-prescription drugs, or dietary supplements you use. Also tell them if you smoke, drink alcohol, or use illegal drugs. Some items may interact with your medicine. What should I watch for while using this medication? Your condition will be monitored carefully while you are receiving this medication. You may need blood work while taking this medication. This medication may make you feel generally unwell. This is not uncommon, as chemotherapy can affect healthy cells as well as cancer cells. Report any side effects. Continue your course of treatment even though you feel ill unless your care team tells you to stop. In some cases, you may be given additional medications to help with side effects. Follow all directions for their use. This medication may increase your risk of getting an infection. Call your care team for advice if you get a fever, chills, sore throat, or other symptoms of a cold or flu. Do not treat yourself. Try to avoid being around people who are sick. Avoid taking medications that contain aspirin, acetaminophen, ibuprofen, naproxen, or ketoprofen unless instructed by your care team. These medications may hide a fever. Be careful brushing or flossing your teeth or using a toothpick because you may get an infection or bleed more easily. If you have any dental work done, tell your dentist you are receiving this medication. Talk to your care team if you wish to become pregnant or think you might be pregnant. This medication can cause serious birth defects. Talk to your care team about effective forms of contraception. Do not breast-feed while taking this medication. What side effects may I notice from receiving this medication? Side effects that you should report to your care team as soon as possible: Allergic reactions--skin  rash, itching, hives, swelling of the face, lips, tongue, or throat Infection--fever, chills, cough, sore throat, wounds that don't heal, pain or trouble when passing urine, general feeling of discomfort or being unwell Low red blood cell level--unusual weakness or fatigue, dizziness, headache, trouble breathing Pain, tingling, or numbness in the hands or feet, muscle weakness, change in vision, confusion or trouble speaking, loss of balance or coordination, trouble walking, seizures Unusual bruising or bleeding Side effects that usually do not require medical attention (report to your care team if they continue or are bothersome): Hair loss Nausea Unusual weakness or  fatigue Vomiting This list may not describe all possible side effects. Call your doctor for medical advice about side effects. You may report side effects to FDA at 1-800-FDA-1088. Where should I keep my medication? This medication is given in a hospital or clinic. It will not be stored at home. NOTE: This sheet is a summary. It may not cover all possible information. If you have questions about this medicine, talk to your doctor, pharmacist, or health care provider.  2024 Elsevier/Gold Standard (2021-04-26 00:00:00)

## 2023-01-12 NOTE — Progress Notes (Signed)
Per Melissa, RPH, Carboplatin 900mg  to run over 60 minutes.

## 2023-01-12 NOTE — Progress Notes (Signed)
Hypersensitivity Reaction note  Date of event: 01/12/23 Time of event: 0941 Generic name of drug involved: Fosaprepitant (Emend) Name of provider notified of the hypersensitivity reaction: Jae Dire, [place] Was agent that likely caused hypersensitivity reaction added to Allergies List within EMR? Yes Chain of events including reaction signs/symptoms, treatment administered, and outcome (e.g., drug resumed; drug discontinued; sent to Emergency Department; etc.)  Approximately 3 min into Emend infusion, pt started to c/o abdominal pain & shortness of breath. Facial flushing noted. Infusion stopped, 1L IVF initiated, 20mg  Pepcid given. Pt states she feels "much better". Emend infusion d/c. VSS throughout. See flowsheets and MAR.   Joella Prince, RN 01/12/2023 9:57 AM

## 2023-01-12 NOTE — Progress Notes (Signed)
    DATE:  01/12/23                                        X MEDICATION REACTION           MD: Pamelia Hoit     AGENT/BLOOD PRODUCT RECEIVING IMMEDIATELY PRIOR TO REACTION:          Emend    Vitals:   01/12/23 0941 01/12/23 0945  BP: (!) 141/91 138/87  Pulse: 93 92  Resp: 18 16  SpO2: 100% 100%      REACTION(S):           facial flushing, abdominal pain, shortness of breath   PREMEDS:     Decadron 10 mg IV, Benadryl 50 mg PO,  Aloxi 0.25 mg IV, Tylenol 650 mg PO   INTERVENTION: Pepcid 20 mg IV   Review of Systems  Review of Systems  Respiratory:  Positive for shortness of breath.   Gastrointestinal:  Positive for abdominal pain.  Skin:  Positive for color change.  All other systems reviewed and are negative.    Physical Exam  Physical Exam Vitals and nursing note reviewed.  Constitutional:      Appearance: She is not ill-appearing or toxic-appearing.  HENT:     Head: Normocephalic.  Eyes:     Conjunctiva/sclera: Conjunctivae normal.  Cardiovascular:     Rate and Rhythm: Normal rate and regular rhythm.     Pulses: Normal pulses.     Heart sounds: Normal heart sounds.  Pulmonary:     Effort: Pulmonary effort is normal.     Breath sounds: Normal breath sounds.  Abdominal:     General: There is no distension.  Musculoskeletal:     Cervical back: Normal range of motion.  Skin:    General: Skin is warm and dry.  Neurological:     Mental Status: She is alert.     OUTCOME:              Patient became symptomatic immediately after IV Johnathan Hausen was started. Emergency medications were administered as documented above. Patient returned to baseline. Oncologist notified and agrees to resume treatment. Patient tolerated remainder of treatment.

## 2023-01-15 ENCOUNTER — Encounter (HOSPITAL_COMMUNITY): Payer: Self-pay

## 2023-01-15 ENCOUNTER — Ambulatory Visit: Payer: Self-pay | Admitting: Genetic Counselor

## 2023-01-15 ENCOUNTER — Emergency Department (HOSPITAL_COMMUNITY)
Admission: EM | Admit: 2023-01-15 | Discharge: 2023-01-15 | Disposition: A | Discharge status: Home / Self Care | Payer: BC Managed Care – PPO | Attending: Emergency Medicine | Admitting: Emergency Medicine

## 2023-01-15 ENCOUNTER — Inpatient Hospital Stay: Payer: BC Managed Care – PPO

## 2023-01-15 ENCOUNTER — Encounter: Payer: Self-pay | Admitting: Hematology and Oncology

## 2023-01-15 ENCOUNTER — Emergency Department (HOSPITAL_COMMUNITY): Payer: BC Managed Care – PPO

## 2023-01-15 ENCOUNTER — Other Ambulatory Visit: Payer: Self-pay

## 2023-01-15 ENCOUNTER — Telehealth: Payer: Self-pay | Admitting: Genetic Counselor

## 2023-01-15 DIAGNOSIS — Z853 Personal history of malignant neoplasm of breast: Secondary | ICD-10-CM | POA: Insufficient documentation

## 2023-01-15 DIAGNOSIS — R079 Chest pain, unspecified: Secondary | ICD-10-CM | POA: Insufficient documentation

## 2023-01-15 DIAGNOSIS — Z800 Family history of malignant neoplasm of digestive organs: Secondary | ICD-10-CM

## 2023-01-15 DIAGNOSIS — J90 Pleural effusion, not elsewhere classified: Secondary | ICD-10-CM | POA: Diagnosis not present

## 2023-01-15 DIAGNOSIS — Z1379 Encounter for other screening for genetic and chromosomal anomalies: Secondary | ICD-10-CM

## 2023-01-15 DIAGNOSIS — C50919 Malignant neoplasm of unspecified site of unspecified female breast: Secondary | ICD-10-CM | POA: Diagnosis not present

## 2023-01-15 DIAGNOSIS — Z171 Estrogen receptor negative status [ER-]: Secondary | ICD-10-CM

## 2023-01-15 DIAGNOSIS — R140 Abdominal distension (gaseous): Secondary | ICD-10-CM | POA: Diagnosis not present

## 2023-01-15 DIAGNOSIS — D72819 Decreased white blood cell count, unspecified: Secondary | ICD-10-CM | POA: Diagnosis not present

## 2023-01-15 DIAGNOSIS — R0789 Other chest pain: Secondary | ICD-10-CM | POA: Diagnosis not present

## 2023-01-15 DIAGNOSIS — Z803 Family history of malignant neoplasm of breast: Secondary | ICD-10-CM

## 2023-01-15 LAB — CBC
HCT: 31.5 % — ABNORMAL LOW (ref 36.0–46.0)
Hemoglobin: 9.4 g/dL — ABNORMAL LOW (ref 12.0–15.0)
MCH: 21.6 pg — ABNORMAL LOW (ref 26.0–34.0)
MCHC: 29.8 g/dL — ABNORMAL LOW (ref 30.0–36.0)
MCV: 72.4 fL — ABNORMAL LOW (ref 80.0–100.0)
Platelets: 187 10*3/uL (ref 150–400)
RBC: 4.35 MIL/uL (ref 3.87–5.11)
RDW: 17.2 % — ABNORMAL HIGH (ref 11.5–15.5)
WBC: 3.2 10*3/uL — ABNORMAL LOW (ref 4.0–10.5)
nRBC: 0 % (ref 0.0–0.2)

## 2023-01-15 LAB — BASIC METABOLIC PANEL
Anion gap: 7 (ref 5–15)
BUN: 12 mg/dL (ref 6–20)
CO2: 23 mmol/L (ref 22–32)
Calcium: 8.9 mg/dL (ref 8.9–10.3)
Chloride: 103 mmol/L (ref 98–111)
Creatinine, Ser: 0.69 mg/dL (ref 0.44–1.00)
GFR, Estimated: >60 mL/min (ref >60)
Glucose, Bld: 106 mg/dL — ABNORMAL HIGH (ref 70–99)
Potassium: 3.7 mmol/L (ref 3.5–5.1)
Sodium: 133 mmol/L — ABNORMAL LOW (ref 135–145)

## 2023-01-15 LAB — HCG, SERUM, QUALITATIVE: Preg, Serum: NEGATIVE

## 2023-01-15 LAB — TROPONIN I (HIGH SENSITIVITY): Troponin I (High Sensitivity): <2 ng/L (ref <18)

## 2023-01-15 LAB — D-DIMER, QUANTITATIVE: D-Dimer, Quant: 1.5 ug{FEU}/mL — ABNORMAL HIGH (ref 0.00–0.50)

## 2023-01-15 MED ORDER — IOHEXOL 350 MG/ML SOLN
75.0000 mL | Freq: Once | INTRAVENOUS | Status: AC | PRN
  Administered 2023-01-15: 75 mL via INTRAVENOUS

## 2023-01-15 MED ORDER — PEGFILGRASTIM-JMDB 6 MG/0.6ML ~~LOC~~ SOSY
6.0000 mg | PREFILLED_SYRINGE | Freq: Once | SUBCUTANEOUS | Status: AC
  Administered 2023-01-15: 6 mg via SUBCUTANEOUS
  Filled 2023-01-15: qty 0.6

## 2023-01-15 NOTE — Telephone Encounter (Signed)
-----   Message from Nurse Threasa Beards sent at 01/12/2023  4:41 PM EST ----- Regarding: Dr Pamelia Hoit pt, first time Susa Loffler, Taxol, Carbo Dr Pamelia Hoit pt came in 12/27 for first time Kanjinti, Perjecta, Docetaxel, Carboplatin. Tolerated infusions well. Needs call back.

## 2023-01-15 NOTE — Telephone Encounter (Signed)
Called pt to see how she did with her recent treatment & she reports going to ED today for pain in her throat/esophagus & back hurting.  She reports eating something greasy.  They didn't find anything major in ED.  She came for her shot today.  She knows her next appt & how to reach [place] if needed.  Instructed to call if symptoms worse.  She denies any fever.

## 2023-01-15 NOTE — Progress Notes (Signed)
HPI:   Ms. Ringham was previously seen in the [facility] due to a personal history of breast cancer and concerns regarding a hereditary predisposition to cancer.    Ms. Grizzel recent genetic test results were disclosed to her by telephone. These results and recommendations are discussed in more detail below.  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%, HER2 3+ positive, 1 axillary lymph node with cortical thickening biopsy: Positive   12/27/2022 Cancer Staging   Staging form: Breast, AJCC 8th Edition - Clinical: Stage IIA (cT1c, cN1, cM0, G2, ER-, PR-, HER2+) - Signed by Serena Croissant, MD on 12/27/2022 Stage prefix: Initial diagnosis Histologic grading system: 3 grade system   01/11/2023 Genetic Testing   Negative [facility] CancerNext +RNAinsight Panel.  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 Report date is 01/11/2023.   The [facility] CancerNext+RNAinsight Panel includes sequencing, rearrangement analysis, and RNA analysis for the following 39 genes: APC, ATM, BAP1, BARD1, BMPR1A, BRCA1, BRCA2, BRIP1, CDH1, CDKN2A, CHEK2, FH, FLCN, MET, MLH1, MSH2, MSH6, MUTYH, NF1, NTHL1, PALB2, PMS2, PTEN, RAD51C, RAD51D, SMAD4, STK11, TP53, TSC1, TSC2, and VHL (sequencing and deletion/duplication); AXIN2, HOXB13, MBD4, MSH3, POLD1 and POLE (sequencing only); EPCAM and GREM1 (deletion/duplication only).    01/12/2023 -  Chemotherapy   Patient is on Treatment Plan : BREAST  Docetaxel + Carboplatin + Trastuzumab + Pertuzumab  (TCHP) q21d        FAMILY HISTORY:  We obtained a detailed, 4-generation family history.  Significant diagnoses are listed below:      Family History  Problem Relation Age of Onset   Breast  cancer Maternal Aunt 67   Colon cancer Maternal Aunt 50   Colon cancer Maternal Uncle 39   Breast cancer Paternal Grandmother          dx mid 77s-70s     Ms. Palley is unaware of previous family history of genetic testing for hereditary cancer risks. There is no reported Ashkenazi Jewish ancestry. There is no known consanguinity.  GENETIC TEST RESULTS:  The [facility] CancerNext+RNAinsight Panel found no pathogenic mutations.   The [facility] CancerNext+RNAinsight Panel includes sequencing, rearrangement analysis, and RNA analysis for the following 39 genes: APC, ATM, BAP1, BARD1, BMPR1A, BRCA1, BRCA2, BRIP1, CDH1, CDKN2A, CHEK2, FH, FLCN, MET, MLH1, MSH2, MSH6, MUTYH, NF1, NTHL1, PALB2, PMS2, PTEN, RAD51C, RAD51D, SMAD4, STK11, TP53, TSC1, TSC2, and VHL (sequencing and deletion/duplication); AXIN2, HOXB13, MBD4, MSH3, POLD1 and POLE (sequencing only); EPCAM and GREM1 (deletion/duplication only).  The test report has been scanned into EPIC and is located under the Molecular Pathology section of the Results Review tab.  A portion of the result report is included below for reference. Genetic testing reported out on January 11, 2023.    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  At this time, it is unknown if this variant is associated with an increased risk for cancer or if it is benign, but most uncertain variants are reclassified to benign. It should not be used to make medical management decisions. With time, we suspect the laboratory will determine the significance of this variant, if any. If the laboratory reclassifies this variant, we will attempt to contact Ms. Padillo to discuss  it further.   Even though a pathogenic variant was not identified, possible explanations for the cancer in the family may include: There may be no hereditary risk for cancer in the family. The cancers in Ms. Speegle and/or her family may be  sporadic/familial or due to other genetic and environmental factors.  Most cancer is not hereditary.  There may be a gene mutation in one of these genes that current testing methods cannot detect but that chance is small. There could be another gene that has not yet been discovered, or that we have not yet tested, that is responsible for the cancer diagnoses in the family.  It is also possible there is a hereditary cause for the cancer in the family that Ms. Castles did not inherit. The variant of uncertain significance detected in the ATM gene may be reclassified as a pathogenic variant in the future. At this time, we do not know if this variant increases the risk for cancer.  Therefore, it is important to remain in touch with cancer genetics in the future so that we can continue to offer Ms. Maclaughlin the most up to date genetic testing.    ADDITIONAL GENETIC TESTING:   Ms. Cashion genetic testing was fairly extensive.  If there are additional relevant genes identified to increase cancer risk that can be analyzed in the future, we would be happy to discuss and coordinate this testing at that time.      CANCER SCREENING RECOMMENDATIONS:  Ms. Nelton test result is considered negative (normal).  This means that we have not identified a hereditary cause for her personal history of breast cancer at this time.   An individual's cancer risk and medical management are not determined by genetic test results alone. Overall cancer risk assessment incorporates additional factors, including personal medical history, family history, and any available genetic information that may result in a personalized plan for cancer prevention and surveillance. Therefore, it is recommended she continue to follow the cancer management and screening guidelines provided by her oncology and primary healthcare provider.   RECOMMENDATIONS FOR FAMILY MEMBERS:   Since she did not inherit a identifiable mutation in a cancer  predisposition gene included on this panel, her children could not have inherited a known mutation from her in one of these genes. Individuals in this family might be at some increased risk of developing cancer, over the general population risk, due to the family history of cancer.  Individuals in the family should notify their providers of the family history of cancer. We recommend women in this family have a yearly mammogram beginning at age 28, or 75 years younger than the earliest onset of cancer, an annual clinical breast exam, and perform monthly breast self-exams.  Risk models that take into account family history and hormonal history may be helpful in determining appropriate breast cancer screening options for family members.  We do not recommend familial testing for the ATM variant of uncertain significance (VUS).  FOLLOW-UP:  Cancer genetics is a rapidly advancing field and it is possible that new genetic tests will be appropriate for her and/or her family members in the future. We encourage Ms. Sybesma to remain in contact with cancer genetics, so we can update her personal and family histories and let her know of advances in cancer genetics that may benefit this family.   Our contact number was provided.  They are welcome to call us at anytime with additional questions or concerns.   Kennadee Walthour M. Rennie Plowman, MS, LCGC  Education officer, [email or web address] [phone]

## 2023-01-15 NOTE — ED Triage Notes (Signed)
Patient arrived with complaints of central chest pain that radiates to her back that started tonight, she received her first chemo treatment Friday. Declines NV shortness of breath or dizziness. Advised to come to ED to rule out a blood clot.

## 2023-01-15 NOTE — ED Provider Notes (Signed)
 [facility] AT [facility] Provider Note   CSN: [id number] Arrival date & time: 01/15/23  0515     History Chief Complaint  Patient presents with   Chest Pain    Kara Webb is a 42 y.o. female.  Patient with past history significant for breast cancer presents emergency department concerns of chest pain.  She reports that she had persistent central chest pain with radiation towards the back last night.  Denies any nausea, vomiting, shortness of breath or dizziness or any other symptoms.  Patient reports that she called the cancer center and was advised to come to the emergency department for evaluation to rule out a blood clot.  Denies any leg swelling, hemoptysis, recent surgery, immobilization, or prior history of DVT or PE.  No sick contacts as far as patient is aware and denies any fever, chills or bodyaches.  Chest Pain      Home Medications Prior to Admission medications   Medication Sig Start Date End Date Taking? Authorizing Provider  ALPRAZolam Prudy Feeler) 0.5 MG tablet Take 1 tablet (0.5 mg total) by mouth at bedtime as needed for anxiety or sleep. 12/28/22   Serena Croissant, MD  cetirizine (ZYRTEC) 10 MG chewable tablet Zyrtec    [provider]  dexamethasone (DECADRON) 4 MG tablet Take 2 tabs by mouth 2 times daily starting day before chemo. Then take 2 tabs daily for 2 days starting day after chemo. Take with food. 12/27/22   Serena Croissant, MD  IUD'S IU IUD's    [provider]  lidocaine-prilocaine (EMLA) cream Apply to affected area once 12/27/22   Serena Croissant, MD  ondansetron (ZOFRAN) 8 MG tablet Take 1 tablet (8 mg total) by mouth every 8 (eight) hours as needed for nausea or vomiting. Start on the third day after chemotherapy. 12/27/22   Serena Croissant, MD  oxyCODONE (OXY IR/ROXICODONE) 5 MG immediate release tablet Take 1 tablet (5 mg total) by mouth every 6 (six) hours as needed for severe pain (pain score 7-10).  01/03/23   Cornett, Maisie Fus, MD  prochlorperazine (COMPAZINE) 10 MG tablet Take 1 tablet (10 mg total) by mouth every 6 (six) hours as needed for nausea or vomiting. 12/27/22   Serena Croissant, MD      Allergies    Emend [fosaprepitant dimeglumine]    Review of Systems   Review of Systems  Cardiovascular:  Positive for chest pain.  All other systems reviewed and are negative.   Physical Exam Updated Vital Signs BP (!) 152/97 (BP Location: Left Arm)   Pulse 96   Temp 98.5 F (36.9 C) (Oral)   Resp 16   LMP 01/12/2023   SpO2 98%  Physical Exam Vitals and nursing note reviewed.  Constitutional:      General: She is not in acute distress.    Appearance: She is well-developed. She is not ill-appearing.  HENT:     Head: Normocephalic and atraumatic.  Eyes:     Conjunctiva/sclera: Conjunctivae normal.  Cardiovascular:     Rate and Rhythm: Normal rate and regular rhythm.     Heart sounds: No murmur heard. Pulmonary:     Effort: Pulmonary effort is normal. No respiratory distress.     Breath sounds: Normal breath sounds. No decreased breath sounds, wheezing, rhonchi or rales.     Comments: Lungs are clear to auscultation bilaterally. Abdominal:     Palpations: Abdomen is soft.     Tenderness: There is no abdominal tenderness.  Musculoskeletal:  General: No swelling.     Cervical back: Neck supple.  Skin:    General: Skin is warm and dry.     Capillary Refill: Capillary refill takes less than 2 seconds.  Neurological:     Mental Status: She is alert.  Psychiatric:        Mood and Affect: Mood normal.     ED Results / Procedures / Treatments   Labs (all labs ordered are listed, but only abnormal results are displayed) Labs Reviewed  BASIC METABOLIC PANEL - Abnormal; Notable for the following components:      Result Value   Sodium 133 (*)    Glucose, Bld 106 (*)    All other components within normal limits  CBC - Abnormal; Notable for the following components:    WBC 3.2 (*)    Hemoglobin 9.4 (*)    HCT 31.5 (*)    MCV 72.4 (*)    MCH 21.6 (*)    MCHC 29.8 (*)    RDW 17.2 (*)    All other components within normal limits  D-DIMER, QUANTITATIVE - Abnormal; Notable for the following components:   D-Dimer, Quant 1.50 (*)    All other components within normal limits  HCG, SERUM, QUALITATIVE  TROPONIN I (HIGH SENSITIVITY)    EKG None  Radiology CT Angio Chest PE W and/or Wo Contrast Result Date: 01/15/2023 CLINICAL DATA:  Pulmonary embolism (PE) suspected, low to intermediate prob, positive D-dimer. Central chest pain radiating to the back. EXAM: CT ANGIOGRAPHY CHEST WITH CONTRAST TECHNIQUE: Multidetector CT imaging of the chest was performed using the standard protocol during bolus administration of intravenous contrast. Multiplanar CT image reconstructions and MIPs were obtained to evaluate the vascular anatomy. RADIATION DOSE REDUCTION: This exam was performed according to the departmental dose-optimization program which includes automated exposure control, adjustment of the mA and/or kV according to patient size and/or use of iterative reconstruction technique. CONTRAST:  75mL OMNIPAQUE IOHEXOL 350 MG/ML SOLN COMPARISON:  Chest radiograph 01/15/2023. FINDINGS: Cardiovascular: Satisfactory opacification of the pulmonary arteries to the segmental level. No evidence of pulmonary embolism. Normal heart size. No pericardial effusion. Right chest Port-A-Cath tip terminates in the superior right atrium. Mediastinum/Nodes: No enlarged mediastinal, hilar, or axillary lymph nodes. Thyroid gland, trachea, and esophagus demonstrate no significant findings. Lungs/Pleura: Lungs are clear. Trace right pleural effusion. No pneumothorax. Upper Abdomen: No acute abnormality. Musculoskeletal: No chest wall abnormality. No acute or significant osseous findings. Review of the MIP images confirms the above findings. IMPRESSION: 1. No evidence of pulmonary embolism. 2. Trace  right pleural effusion. Electronically Signed   By: Orvan Falconer M.D.   On: 01/15/2023 09:00   DG Chest 2 View Result Date: 01/15/2023 CLINICAL DATA:  42 year old female with chest pain radiating to the back onset tonight. Recently started chemotherapy for breast cancer. EXAM: CHEST - 2 VIEW COMPARISON:  Chest radiographs 09/25/2018. FINDINGS: PA and lateral views 0557 hours. Right chest power port in place, no adverse features identified. Normal cardiac size and mediastinal contours. Normal lung volumes. Visualized tracheal air column is within normal limits. Both lungs appear clear. No pneumothorax or pleural effusion. No acute or suspicious osseous lesion identified. Paucity of bowel gas. IMPRESSION: Right chest power port with no adverse features identified. No cardiopulmonary abnormality. Electronically Signed   By: Odessa Fleming M.D.   On: 01/15/2023 06:17    Procedures Procedures    Medications Ordered in ED Medications  iohexol (OMNIPAQUE) 350 MG/ML injection 75 mL (75 mLs Intravenous Contrast  Given 01/15/23 6578)    ED Course/ Medical Decision Making/ A&P                                 Medical Decision Making Amount and/or Complexity of Data Reviewed Labs: ordered. Radiology: ordered.  Risk Prescription drug management.   This patient presents to the ED for concern of chest pain.  Differential diagnosis includes ACS, PE, pneumonia, viral URI   Lab Tests:  I Ordered, and personally interpreted labs.  The pertinent results include: CBC with leukopenia 3.2 which is expected given patient's chemotherapy, hemoglobin at 9.4 slightly decreased compared to labs from 3 days ago and 2 weeks ago, BMP with mild hyponatremia 133, D-dimer elevated at 1.5, troponin negative at less than 2, hCG negative   Imaging Studies ordered:  I ordered imaging studies including chest x-ray, CT angio chest I independently visualized and interpreted imaging which showed no acute cardiopulmonary  process, no evidence of PE, trace right pleural effusion I agree with the radiologist interpretation   Problem List / ED Course:  Patient with past history significant for breast cancer presents emergency department concerns of chest pain.  Reports that she started her first chemo session last week has been stable over the last couple days but began experience centralized chest pain with some radiation towards the back starting last night/this morning.  Called into the cancer center was advised to come to the emergency department to rule out a blood clot.  Patient with no prior history of PE, DVT is not currently on blood thinners.  Denies any hemoptysis, leg swelling, lightheadedness, dizziness, recent surgery or immobilization.  Although patient is PERC negative based on screening criteria, patient still somewhat high risk given cancer history.  Will proceed with D-dimer to rule out possible PE.  Patient otherwise largely asymptomatic and comfortable at this time Labs show some mild leukopenia 3.2 which is expected as patient is in between injections for further chemotherapy.  Breast cancer.  Baseline anemia slightly declining with hemoglobin at 9.4, no indication for transfusion.  CBC with mild hyponatremia 133.  D-dimer elevated 1.5 and will require CT imaging.  Troponin negative, hCG negative. CT angio chest negative for evidence of PE.  Trace right pleural effusion seen but likely not source of patient's symptoms. Possible other etiologies include GERD, esophageal spasms, gastritis, pleurisy, etc.  At this point, unsure what is causing patient's symptoms or discomfort that she is reporting.  She appears comfortable and stable and I do not feel that she requires further evaluation or admission to the hospital at this time.  Advised patient to follow-up with oncologist as currently planned.  Strict return precautions advised.  Discharged home in stable condition.  Final Clinical Impression(s) / ED  Diagnoses Final diagnoses:  Nonspecific chest pain    Rx / DC Orders ED Discharge Orders     None         Smitty Knudsen, PA-C 01/15/23 4696    Alvira Monday, MD 01/16/23 [phone]

## 2023-01-15 NOTE — Telephone Encounter (Signed)
Disclosed negative genetics and VUS in ATM

## 2023-01-15 NOTE — Discharge Instructions (Signed)
You are seen in the emergency department today for concerns of chest pain and discomfort.  Your labs and imaging are reassuring with no evidence of any heart damage happening.  Your D-dimer level was elevated so a CT scan was performed which is negative for any signs of blood clot.  I would recommend following up with her oncologist for further evaluation.  Symptoms continue to persist or worsen, return the emergency department.

## 2023-01-16 ENCOUNTER — Other Ambulatory Visit: Payer: Self-pay | Admitting: *Deleted

## 2023-01-16 DIAGNOSIS — T50905A Adverse effect of unspecified drugs, medicaments and biological substances, initial encounter: Secondary | ICD-10-CM

## 2023-01-18 ENCOUNTER — Encounter: Payer: Self-pay | Admitting: Nurse Practitioner

## 2023-01-18 ENCOUNTER — Inpatient Hospital Stay (HOSPITAL_BASED_OUTPATIENT_CLINIC_OR_DEPARTMENT_OTHER): Payer: BC Managed Care – PPO | Admitting: Nurse Practitioner

## 2023-01-18 ENCOUNTER — Inpatient Hospital Stay: Payer: BC Managed Care – PPO | Attending: Hematology and Oncology

## 2023-01-18 VITALS — BP 125/84 | HR 107 | Temp 98.4°F | Resp 18 | Ht 66.0 in | Wt 278.0 lb

## 2023-01-18 DIAGNOSIS — C50411 Malignant neoplasm of upper-outer quadrant of right female breast: Secondary | ICD-10-CM | POA: Insufficient documentation

## 2023-01-18 DIAGNOSIS — R0789 Other chest pain: Secondary | ICD-10-CM | POA: Insufficient documentation

## 2023-01-18 DIAGNOSIS — Z7952 Long term (current) use of systemic steroids: Secondary | ICD-10-CM | POA: Insufficient documentation

## 2023-01-18 DIAGNOSIS — D509 Iron deficiency anemia, unspecified: Secondary | ICD-10-CM | POA: Diagnosis not present

## 2023-01-18 DIAGNOSIS — R197 Diarrhea, unspecified: Secondary | ICD-10-CM | POA: Insufficient documentation

## 2023-01-18 DIAGNOSIS — Z5112 Encounter for antineoplastic immunotherapy: Secondary | ICD-10-CM | POA: Insufficient documentation

## 2023-01-18 DIAGNOSIS — Z5111 Encounter for antineoplastic chemotherapy: Secondary | ICD-10-CM | POA: Insufficient documentation

## 2023-01-18 DIAGNOSIS — R5383 Other fatigue: Secondary | ICD-10-CM | POA: Diagnosis not present

## 2023-01-18 DIAGNOSIS — Z87440 Personal history of urinary (tract) infections: Secondary | ICD-10-CM | POA: Diagnosis not present

## 2023-01-18 DIAGNOSIS — Z5189 Encounter for other specified aftercare: Secondary | ICD-10-CM | POA: Insufficient documentation

## 2023-01-18 DIAGNOSIS — Z79899 Other long term (current) drug therapy: Secondary | ICD-10-CM | POA: Insufficient documentation

## 2023-01-18 DIAGNOSIS — Z171 Estrogen receptor negative status [ER-]: Secondary | ICD-10-CM

## 2023-01-18 DIAGNOSIS — T50905A Adverse effect of unspecified drugs, medicaments and biological substances, initial encounter: Secondary | ICD-10-CM

## 2023-01-18 LAB — CBC WITH DIFFERENTIAL (CANCER CENTER ONLY)
Abs Immature Granulocytes: 0 10*3/uL (ref 0.00–0.07)
Basophils Absolute: 0 10*3/uL (ref 0.0–0.1)
Basophils Relative: 2 %
Eosinophils Absolute: 0.1 10*3/uL (ref 0.0–0.5)
Eosinophils Relative: 9 %
HCT: 30.4 % — ABNORMAL LOW (ref 36.0–46.0)
Hemoglobin: 9.5 g/dL — ABNORMAL LOW (ref 12.0–15.0)
Lymphocytes Relative: 46 %
Lymphs Abs: 0.6 10*3/uL — ABNORMAL LOW (ref 0.7–4.0)
MCH: 21.9 pg — ABNORMAL LOW (ref 26.0–34.0)
MCHC: 31.3 g/dL (ref 30.0–36.0)
MCV: 70.2 fL — ABNORMAL LOW (ref 80.0–100.0)
Monocytes Absolute: 0.1 10*3/uL (ref 0.1–1.0)
Monocytes Relative: 8 %
Neutro Abs: 0.5 10*3/uL — ABNORMAL LOW (ref 1.7–7.7)
Neutrophils Relative %: 35 %
Platelet Count: 156 10*3/uL (ref 150–400)
RBC: 4.33 MIL/uL (ref 3.87–5.11)
RDW: 16.4 % — ABNORMAL HIGH (ref 11.5–15.5)
WBC Count: 1.4 10*3/uL — ABNORMAL LOW (ref 4.0–10.5)
nRBC: 0 % (ref 0.0–0.2)

## 2023-01-18 LAB — CMP (CANCER CENTER ONLY)
ALT: 22 U/L (ref 0–44)
AST: 15 U/L (ref 15–41)
Albumin: 3.7 g/dL (ref 3.5–5.0)
Alkaline Phosphatase: 64 U/L (ref 38–126)
Anion gap: 7 (ref 5–15)
BUN: 14 mg/dL (ref 6–20)
CO2: 27 mmol/L (ref 22–32)
Calcium: 9.3 mg/dL (ref 8.9–10.3)
Chloride: 104 mmol/L (ref 98–111)
Creatinine: 0.94 mg/dL (ref 0.44–1.00)
GFR, Estimated: >60 mL/min (ref >60)
Glucose, Bld: 114 mg/dL — ABNORMAL HIGH (ref 70–99)
Potassium: 3.4 mmol/L — ABNORMAL LOW (ref 3.5–5.1)
Sodium: 138 mmol/L (ref 135–145)
Total Bilirubin: 0.3 mg/dL (ref 0.0–1.2)
Total Protein: 7.5 g/dL (ref 6.5–8.1)

## 2023-01-18 MED ORDER — AZITHROMYCIN 250 MG PO TABS: ORAL_TABLET | ORAL | 0 refills | Status: DC

## 2023-01-18 MED ORDER — SODIUM CHLORIDE 0.9% FLUSH
10.0000 mL | Freq: Once | INTRAVENOUS | Status: AC
Start: 2023-01-18 — End: 2023-01-18
  Administered 2023-01-18: 10 mL via INTRAVENOUS

## 2023-01-18 MED ORDER — HEPARIN SOD (PORK) LOCK FLUSH 100 UNIT/ML IV SOLN
500.0000 [IU] | Freq: Once | INTRAVENOUS | Status: AC
  Administered 2023-01-18: 500 [IU] via INTRAVENOUS

## 2023-01-18 NOTE — Progress Notes (Signed)
 Patient Care Team: Ilah Crigler, MD as PCP - General (Family Medicine) Tyree Nanetta SAILOR, RN as Oncology Nurse Navigator Glean, Stephane BROCKS, RN as Oncology Nurse Navigator Shannon Agent, MD as Consulting Physician (Radiation Oncology) Odean Potts, MD as Consulting Physician (Hematology and Oncology) Vanderbilt Ned, MD as Consulting Physician (General Surgery)   CHIEF COMPLAINT: Toxicity check s/p cycle 1 neoadjuvant TCHP, right breast cancer  CURRENT THERAPY: Neoadjuvant TCHP every 3 weeks x 6, starting 01/12/23  INTERVAL HISTORY Ms. Amesquita presents for tox check following cycle 1 TCHP given 01/12/2023.  She had a hypersensitivity reaction to Emend, experiencing shortness of breath, abdominal pain, and color change.  She was given Pepcid  and symptoms resolved, able to complete the remainder of the infusion and all of the planned chemo.  She ate a chicken philli sandwich on day 2 and went to sleep, woke up early 12/30 on day 3 with sternal pain that radiated to her back. She called triage line wanting to take meds but was instructed to come to ED which she did. CBC/CMP were unremarkable, troponins negative, but due to an elevated D-dimer she underwent CTA which was negative. She was discharged and returned to cancer center later that day for G-CSF which she tolerated well. Moderate fatigue on day 3 improved over days 4 and 5. Able to do light house work and go for a walk. One day she walked in the cold/rain with head uncovered and now has mild drainage, a raspy voice, and coughed up phlegm once. No fever/chills. She had diarrhea after drinking ice tea.   ROS  All other systems reviewed and negative  Past Medical History:  Diagnosis Date   Breast cancer (HCC)    BV (bacterial vaginosis)    Cold intolerance 01/16/2005   Fatigue 01/16/2005   H/O chlamydia infection 08/12/2010   H/O urinary frequency 11/16/2000   H/O vaginal discharge 11/17/2003   H/O varicella    Heart murmur    Born  with murmur   HSV-1 infection    Hx: UTI (urinary tract infection)    Pelvic pain    Vulvovaginal candidiasis 04/16/2005   Yeast infection      Past Surgical History:  Procedure Laterality Date   BREAST BIOPSY Right 12/18/2022   US  RT BREAST BX W LOC DEV 1ST LESION IMG BX SPEC US  GUIDE 12/18/2022 [facility] MAMMOGRAPHY   PORTACATH PLACEMENT N/A 01/03/2023   Procedure: INSERTION PORT-A-CATH WITH ULTRASOUND GUIDEANCE;  Surgeon: Vanderbilt Ned, MD;  Location: [facility];  Service: General;  Laterality: N/A;   TOE SURGERY  2004   WISDOM TOOTH EXTRACTION  2003     Outpatient Encounter Medications as of 01/18/2023  Medication Sig   ALPRAZolam  (XANAX ) 0.5 MG tablet Take 1 tablet (0.5 mg total) by mouth at bedtime as needed for anxiety or sleep.   azithromycin  (ZITHROMAX  Z-PAK) 250 MG tablet Per package insert   cetirizine (ZYRTEC) 10 MG chewable tablet Zyrtec   dexamethasone  (DECADRON ) 4 MG tablet Take 2 tabs by mouth 2 times daily starting day before chemo. Then take 2 tabs daily for 2 days starting day after chemo. Take with food.   IUD'S IU IUD's   lidocaine -prilocaine  (EMLA ) cream Apply to affected area once   ondansetron  (ZOFRAN ) 8 MG tablet Take 1 tablet (8 mg total) by mouth every 8 (eight) hours as needed for nausea or vomiting. Start on the third day after chemotherapy.   oxyCODONE  (OXY IR/ROXICODONE ) 5 MG immediate release tablet Take 1 tablet (  5 mg total) by mouth every 6 (six) hours as needed for severe pain (pain score 7-10).   prochlorperazine  (COMPAZINE ) 10 MG tablet Take 1 tablet (10 mg total) by mouth every 6 (six) hours as needed for nausea or vomiting.   No facility-administered encounter medications on file as of 01/18/2023.     Today's Vitals   01/18/23 1016 01/18/23 1017  BP:  125/84  Pulse:  (!) 107  Resp:  18  Temp:  98.4 F (36.9 C)  TempSrc:  Temporal  SpO2:  100%  Weight:  278 lb (126.1 kg)  Height:  5' 6 (1.676 m)  PainSc: 0-No pain    Body  mass index is 44.87 kg/m.   PHYSICAL EXAM GENERAL:alert, no distress and comfortable SKIN: no rash  EYES: sclera clear NECK: without mass LYMPH:  no palpable cervical or supraclavicular lymphadenopathy  LUNGS: clear with normal breathing effort HEART: regular rate & rhythm, no lower extremity edema ABDOMEN: abdomen soft, non-tender and normal bowel sounds NEURO: alert & oriented x 3 with fluent speech, no focal motor/sensory deficits Breast exam: deferred PAC without erythema    CBC    Component Value Date/Time   WBC 1.4 (L) 01/18/2023 0949   WBC 3.2 (L) 01/15/2023 0732   RBC 4.33 01/18/2023 0949   HGB 9.5 (L) 01/18/2023 0949   HCT 30.4 (L) 01/18/2023 0949   PLT 156 01/18/2023 0949   MCV 70.2 (L) 01/18/2023 0949   MCH 21.9 (L) 01/18/2023 0949   MCHC 31.3 01/18/2023 0949   RDW 16.4 (H) 01/18/2023 0949   LYMPHSABS 0.6 (L) 01/18/2023 0949   MONOABS 0.1 01/18/2023 0949   EOSABS 0.1 01/18/2023 0949   BASOSABS 0.0 01/18/2023 0949     CMP     Component Value Date/Time   NA 138 01/18/2023 0949   K 3.4 (L) 01/18/2023 0949   CL 104 01/18/2023 0949   CO2 27 01/18/2023 0949   GLUCOSE 114 (H) 01/18/2023 0949   BUN 14 01/18/2023 0949   CREATININE 0.94 01/18/2023 0949   CALCIUM 9.3 01/18/2023 0949   PROT 7.5 01/18/2023 0949   ALBUMIN 3.7 01/18/2023 0949   AST 15 01/18/2023 0949   ALT 22 01/18/2023 0949   ALKPHOS 64 01/18/2023 0949   BILITOT 0.3 01/18/2023 0949   GFRNONAA >60 01/18/2023 0949   GFRAA >60 09/25/2018 0052     ASSESSMENT & PLAN: 43 year old premenopausal female  Right breast cancer, cT1c, cN1 grade 2 IDC, ER 0%, PR 0%, HER2 3+ positive, Ki-67 30%, stage II -Presented with a palpable right breast mass, mass and Ax LN both positive -Plan is for neoadjuvant TCHP followed by maintenance based on pathologic response (herceptin /perjeta  vs kadcyla), definitive surgery, adjuvant radiation -MRI showed additional abnormalities, biopsy scheduled 01/19/23 -Genetics:  negative, VUS in ATM. 2 weeks ago learned a paternal cousin age 63 also diagnosed with breast cancer  -Ms. Stlouis appears well. S/p C1D7 neoadjuvant TCHP with GCSF. She reacted to End but otherwise tolerated chemo well overall. She has recovered with good PS.  -I reviewed her ED course for chest pain that turned out likely to be GERD from her diet. We reviewed nutrition/hydration -We reviewed symptom management for GERD and diarrhea; she can take antacid and imodium PRN, respectively. I offered daily PPI but she prefers to try OTC/PRN for now. She knows to call if these are not well controlled -We discussed her upcoming breast biopsy and answered questions. She has begun thinking about mastectomy if the additional biopsy is positive  for cancer. She would like to hold on plastic surgery referral until the biopsy is back.  -Labs reviewed.  -Return for scheduled lab, f/up, and C2 on 02/01/23   URI -She went outside in cold/rain without hat, developed nasal drainage, hoarseness, and cough x1; afebrile. Lungs clear -We reviewed symptom management and precautions. Given that her ANC is 0.5, I will go ahead with abx with strict ED precautions  Family planning -1 son in college -LMP 12/27 C1 D1 TCHP -No plans for further childbearing    PLAN: -S/p C1D7 TCHP -ED course and today's labs reviewed -ANC 0.5 (s/p gcsf 12/30), Rx: Zpak start now, call if worse/no better in 24 - 48 hrs; and ED/return precautions -Reviewed nutrition/hydration, symptom management for GERD (antacid) and diarrhea (imodium) -Breast MRI reviewed, additional R breast bx 01/19/23 as scheduled -Pt thinking about mastectomy if additional bx is positive, prefers to hold plastic surgery referral until results are back -Lab/flush, f/up with Dr. Gudena, and C2 [facility] 02/01/23 as scheduled   All questions were answered. The patient knows to call the clinic with any problems, questions or concerns. No barriers to learning were detected. I  spent 30 minutes counseling the patient face to face. The total time spent in the appointment was 40 minutes and more than 50% was on counseling, review of test results, and coordination of care.   Elynn Patteson, NP-C 01/18/2023

## 2023-01-19 ENCOUNTER — Other Ambulatory Visit: Payer: Self-pay | Admitting: *Deleted

## 2023-01-19 ENCOUNTER — Ambulatory Visit
Admission: RE | Admit: 2023-01-19 | Discharge: 2023-01-19 | Disposition: A | Discharge status: Home / Self Care | Payer: BC Managed Care – PPO | Source: Ambulatory Visit | Attending: Hematology and Oncology | Admitting: Hematology and Oncology

## 2023-01-19 DIAGNOSIS — C50411 Malignant neoplasm of upper-outer quadrant of right female breast: Secondary | ICD-10-CM

## 2023-01-19 DIAGNOSIS — R928 Other abnormal and inconclusive findings on diagnostic imaging of breast: Secondary | ICD-10-CM

## 2023-01-19 DIAGNOSIS — N6311 Unspecified lump in the right breast, upper outer quadrant: Secondary | ICD-10-CM | POA: Diagnosis not present

## 2023-01-19 DIAGNOSIS — D0511 Intraductal carcinoma in situ of right breast: Secondary | ICD-10-CM | POA: Diagnosis not present

## 2023-01-19 DIAGNOSIS — C773 Secondary and unspecified malignant neoplasm of axilla and upper limb lymph nodes: Secondary | ICD-10-CM | POA: Diagnosis not present

## 2023-01-19 MED ORDER — GADOPICLENOL 0.5 MMOL/ML IV SOLN
10.0000 mL | Freq: Once | INTRAVENOUS | Status: AC | PRN
  Administered 2023-01-19: 10 mL via INTRAVENOUS

## 2023-01-19 NOTE — Progress Notes (Signed)
 Received call from pt stating she declined 1 L NS yesterday but has now changed her mind and would like to proceed.  Orders placed under sign and held per MD. Pt scheduled and verbalized understanding of appt details.

## 2023-01-20 ENCOUNTER — Inpatient Hospital Stay: Payer: BC Managed Care – PPO

## 2023-01-20 DIAGNOSIS — D509 Iron deficiency anemia, unspecified: Secondary | ICD-10-CM | POA: Diagnosis not present

## 2023-01-20 DIAGNOSIS — Z5189 Encounter for other specified aftercare: Secondary | ICD-10-CM | POA: Diagnosis not present

## 2023-01-20 DIAGNOSIS — R0789 Other chest pain: Secondary | ICD-10-CM | POA: Diagnosis not present

## 2023-01-20 DIAGNOSIS — Z5111 Encounter for antineoplastic chemotherapy: Secondary | ICD-10-CM | POA: Diagnosis not present

## 2023-01-20 DIAGNOSIS — Z5112 Encounter for antineoplastic immunotherapy: Secondary | ICD-10-CM | POA: Diagnosis not present

## 2023-01-20 DIAGNOSIS — Z87440 Personal history of urinary (tract) infections: Secondary | ICD-10-CM | POA: Diagnosis not present

## 2023-01-20 DIAGNOSIS — C50411 Malignant neoplasm of upper-outer quadrant of right female breast: Secondary | ICD-10-CM | POA: Diagnosis not present

## 2023-01-20 DIAGNOSIS — Z79899 Other long term (current) drug therapy: Secondary | ICD-10-CM | POA: Diagnosis not present

## 2023-01-20 DIAGNOSIS — R5383 Other fatigue: Secondary | ICD-10-CM | POA: Diagnosis not present

## 2023-01-20 DIAGNOSIS — R197 Diarrhea, unspecified: Secondary | ICD-10-CM | POA: Diagnosis not present

## 2023-01-20 DIAGNOSIS — Z7952 Long term (current) use of systemic steroids: Secondary | ICD-10-CM | POA: Diagnosis not present

## 2023-01-20 MED ORDER — SODIUM CHLORIDE 0.9 % IV SOLN: INTRAVENOUS | Status: DC

## 2023-01-22 ENCOUNTER — Encounter: Payer: Self-pay | Admitting: *Deleted

## 2023-01-22 LAB — SURGICAL PATHOLOGY

## 2023-01-30 ENCOUNTER — Encounter: Payer: Self-pay | Admitting: Hematology and Oncology

## 2023-01-31 ENCOUNTER — Encounter: Payer: Self-pay | Admitting: Diagnostic Radiology

## 2023-01-31 MED FILL — Fosaprepitant Dimeglumine For IV Infusion 150 MG (Base Eq): INTRAVENOUS | Qty: 5 | Status: AC

## 2023-02-01 ENCOUNTER — Inpatient Hospital Stay: Payer: BC Managed Care – PPO

## 2023-02-01 ENCOUNTER — Encounter: Payer: Self-pay | Admitting: Hematology and Oncology

## 2023-02-01 ENCOUNTER — Inpatient Hospital Stay (HOSPITAL_BASED_OUTPATIENT_CLINIC_OR_DEPARTMENT_OTHER): Payer: BC Managed Care – PPO | Admitting: Hematology and Oncology

## 2023-02-01 VITALS — HR 94

## 2023-02-01 VITALS — BP 139/93 | HR 109 | Temp 98.2°F | Resp 18 | Ht 66.0 in | Wt 281.0 lb

## 2023-02-01 DIAGNOSIS — C50411 Malignant neoplasm of upper-outer quadrant of right female breast: Secondary | ICD-10-CM

## 2023-02-01 DIAGNOSIS — Z87440 Personal history of urinary (tract) infections: Secondary | ICD-10-CM | POA: Diagnosis not present

## 2023-02-01 DIAGNOSIS — R197 Diarrhea, unspecified: Secondary | ICD-10-CM | POA: Diagnosis not present

## 2023-02-01 DIAGNOSIS — Z171 Estrogen receptor negative status [ER-]: Secondary | ICD-10-CM | POA: Diagnosis not present

## 2023-02-01 DIAGNOSIS — D509 Iron deficiency anemia, unspecified: Secondary | ICD-10-CM | POA: Diagnosis not present

## 2023-02-01 DIAGNOSIS — Z5111 Encounter for antineoplastic chemotherapy: Secondary | ICD-10-CM | POA: Diagnosis not present

## 2023-02-01 DIAGNOSIS — Z7952 Long term (current) use of systemic steroids: Secondary | ICD-10-CM | POA: Diagnosis not present

## 2023-02-01 DIAGNOSIS — R0789 Other chest pain: Secondary | ICD-10-CM | POA: Diagnosis not present

## 2023-02-01 DIAGNOSIS — Z5112 Encounter for antineoplastic immunotherapy: Secondary | ICD-10-CM | POA: Diagnosis not present

## 2023-02-01 DIAGNOSIS — R5383 Other fatigue: Secondary | ICD-10-CM | POA: Diagnosis not present

## 2023-02-01 DIAGNOSIS — Z5189 Encounter for other specified aftercare: Secondary | ICD-10-CM | POA: Diagnosis not present

## 2023-02-01 DIAGNOSIS — Z79899 Other long term (current) drug therapy: Secondary | ICD-10-CM | POA: Diagnosis not present

## 2023-02-01 LAB — CMP (CANCER CENTER ONLY)
ALT: 10 U/L (ref 0–44)
AST: 9 U/L — ABNORMAL LOW (ref 15–41)
Albumin: 3.8 g/dL (ref 3.5–5.0)
Alkaline Phosphatase: 55 U/L (ref 38–126)
Anion gap: 6 (ref 5–15)
BUN: 10 mg/dL (ref 6–20)
CO2: 26 mmol/L (ref 22–32)
Calcium: 9.5 mg/dL (ref 8.9–10.3)
Chloride: 106 mmol/L (ref 98–111)
Creatinine: 0.91 mg/dL (ref 0.44–1.00)
GFR, Estimated: >60 mL/min (ref >60)
Glucose, Bld: 104 mg/dL — ABNORMAL HIGH (ref 70–99)
Potassium: 3.6 mmol/L (ref 3.5–5.1)
Sodium: 138 mmol/L (ref 135–145)
Total Bilirubin: 0.3 mg/dL (ref 0.0–1.2)
Total Protein: 7 g/dL (ref 6.5–8.1)

## 2023-02-01 LAB — CBC WITH DIFFERENTIAL (CANCER CENTER ONLY)
Abs Immature Granulocytes: 0.01 10*3/uL (ref 0.00–0.07)
Basophils Absolute: 0 10*3/uL (ref 0.0–0.1)
Basophils Relative: 0 %
Eosinophils Absolute: 0 10*3/uL (ref 0.0–0.5)
Eosinophils Relative: 0 %
HCT: 30.5 % — ABNORMAL LOW (ref 36.0–46.0)
Hemoglobin: 9.2 g/dL — ABNORMAL LOW (ref 12.0–15.0)
Immature Granulocytes: 0 %
Lymphocytes Relative: 19 %
Lymphs Abs: 1.2 10*3/uL (ref 0.7–4.0)
MCH: 21.7 pg — ABNORMAL LOW (ref 26.0–34.0)
MCHC: 30.2 g/dL (ref 30.0–36.0)
MCV: 71.9 fL — ABNORMAL LOW (ref 80.0–100.0)
Monocytes Absolute: 0.5 10*3/uL (ref 0.1–1.0)
Monocytes Relative: 9 %
Neutro Abs: 4.4 10*3/uL (ref 1.7–7.7)
Neutrophils Relative %: 72 %
Platelet Count: 213 10*3/uL (ref 150–400)
RBC: 4.24 MIL/uL (ref 3.87–5.11)
RDW: 19 % — ABNORMAL HIGH (ref 11.5–15.5)
WBC Count: 6.2 10*3/uL (ref 4.0–10.5)
nRBC: 0 % (ref 0.0–0.2)

## 2023-02-01 LAB — PREGNANCY, URINE: Preg Test, Ur: NEGATIVE

## 2023-02-01 MED ORDER — ACETAMINOPHEN 325 MG PO TABS
650.0000 mg | ORAL_TABLET | Freq: Once | ORAL | Status: AC
  Administered 2023-02-01: 650 mg via ORAL
  Filled 2023-02-01: qty 2

## 2023-02-01 MED ORDER — SODIUM CHLORIDE 0.9 % IV SOLN
800.0000 mg | Freq: Once | INTRAVENOUS | Status: AC
  Administered 2023-02-01: 800 mg via INTRAVENOUS
  Filled 2023-02-01: qty 80

## 2023-02-01 MED ORDER — DOCETAXEL CHEMO INJECTION 160 MG/16ML
75.0000 mg/m2 | Freq: Once | INTRAVENOUS | Status: AC
  Administered 2023-02-01: 186 mg via INTRAVENOUS
  Filled 2023-02-01: qty 18.6

## 2023-02-01 MED ORDER — SODIUM CHLORIDE 0.9 % IV SOLN
420.0000 mg | Freq: Once | INTRAVENOUS | Status: AC
  Administered 2023-02-01: 420 mg via INTRAVENOUS
  Filled 2023-02-01: qty 14

## 2023-02-01 MED ORDER — PALONOSETRON HCL INJECTION 0.25 MG/5ML
0.2500 mg | Freq: Once | INTRAVENOUS | Status: AC
  Administered 2023-02-01: 0.25 mg via INTRAVENOUS
  Filled 2023-02-01: qty 5

## 2023-02-01 MED ORDER — FERROUS GLUCONATE 324 (38 FE) MG PO TABS: 324.0000 mg | ORAL_TABLET | Freq: Two times a day (BID) | ORAL | 6 refills | Status: AC

## 2023-02-01 MED ORDER — DIPHENHYDRAMINE HCL 25 MG PO CAPS
50.0000 mg | ORAL_CAPSULE | Freq: Once | ORAL | Status: AC
  Administered 2023-02-01: 50 mg via ORAL
  Filled 2023-02-01: qty 2

## 2023-02-01 MED ORDER — TRASTUZUMAB-ANNS CHEMO 150 MG IV SOLR
6.0000 mg/kg | Freq: Once | INTRAVENOUS | Status: AC
  Administered 2023-02-01: 840 mg via INTRAVENOUS
  Filled 2023-02-01: qty 40

## 2023-02-01 MED ORDER — HEPARIN SOD (PORK) LOCK FLUSH 100 UNIT/ML IV SOLN
500.0000 [IU] | Freq: Once | INTRAVENOUS | Status: AC | PRN
  Administered 2023-02-01: 500 [IU]

## 2023-02-01 MED ORDER — SODIUM CHLORIDE 0.9 % IV SOLN: INTRAVENOUS | Status: DC

## 2023-02-01 MED ORDER — DEXAMETHASONE SODIUM PHOSPHATE 10 MG/ML IJ SOLN
10.0000 mg | Freq: Once | INTRAMUSCULAR | Status: AC
  Administered 2023-02-01: 10 mg via INTRAVENOUS
  Filled 2023-02-01: qty 1

## 2023-02-01 MED ORDER — SODIUM CHLORIDE 0.9% FLUSH
10.0000 mL | INTRAVENOUS | Status: DC | PRN
  Administered 2023-02-01: 10 mL

## 2023-02-01 NOTE — Research (Signed)
DCP-001: Use of a Clinical Trial Screening Tool to Address Cancer Health Disparities in the [employer] Community Oncology Research Program [facility])  Patient Kara Webb was identified by Dr. Pamelia Hoit as a potential candidate for the above listed study.  This Clinical Research Nurse met with Damisha Heiden, [id number], on 02/01/23 in a manner and location that ensures patient privacy to discuss participation in the above listed research study.  Patient is Accompanied by her mom .  A copy of the informed consent document and separate HIPAA Authorization was provided to the patient.  Patient reads, speaks, and understands [place].    Patient was provided with the business card of this Nurse and encouraged to contact the research team with any questions.  Patient was provided the option of taking informed consent documents home to review and was encouraged to review at their convenience with their support network, including other care providers. Patient is comfortable with making a decision regarding study participation today.  As outlined in the informed consent form, this Nurse and Carleene Cooper discussed the purpose of the research study, the investigational nature of the study, study procedures and requirements for study participation, potential risks and benefits of study participation, as well as alternatives to participation. This study is not blinded. The patient understands participation is voluntary and they may withdraw from study participation at any time.  This study does not involve randomization.  This study does not involve an investigational drug or device. This study does not involve a placebo. Patient understands enrollment is pending full eligibility review.   Confidentiality and how the patient's information will be used as part of study participation were discussed.  Patient was informed there is not reimbursement provided for their time and effort spent on trial participation.    All questions  were answered to patient's satisfaction.  The informed consent and separate HIPAA Authorization was reviewed page by page.  The patient's mental and emotional status is appropriate to provide informed consent, and the patient verbalizes an understanding of study participation.  Patient has agreed to participate in the above listed research study and has voluntarily signed the informed consent version 16, dated 02-14-21. and separate HIPAA Authorization, version [place] IRB approved version 06-26-22  on 02/01/23 at 9:30AM.  The patient was provided with a copy of the signed informed consent form and separate HIPAA Authorization for their reference.  No study specific procedures were obtained prior to the signing of the informed consent document.  Approximately 15 minutes were spent with the patient reviewing the informed consent documents.  Patient was not requested to complete a Release of Information form.  Pt was interviewed in infusion clinic for necessary data required for this study.   This Nurse has reviewed this patient's inclusion and exclusion criteria and confirmed [name] is eligible for study participation.  Patient will continue with enrollment.  Zerita Boers BSN RN Clinical Research Nurse [facility] Direct Dial: [phone] 02/01/2023  12:40 PM

## 2023-02-01 NOTE — Progress Notes (Signed)
Patient Care Team: [name], MD as PCP - General (Family Medicine) Donnelly Angelica, RN as Oncology Nurse Navigator Lu Duffel, Margretta Ditty, RN as Oncology Nurse Navigator Antony Blackbird, MD as Consulting Physician (Radiation Oncology) Serena Croissant, MD as Consulting Physician (Hematology and Oncology) Harriette Bouillon, MD as Consulting Physician (General Surgery)  DIAGNOSIS:  Encounter Diagnosis  Name Primary?   Malignant neoplasm of upper-outer quadrant of right breast in female, estrogen receptor negative (HCC) Yes    SUMMARY OF ONCOLOGIC HISTORY: Oncology History  Malignant neoplasm of upper-outer quadrant of right breast in female, estrogen receptor negative (HCC)  12/18/2022 Initial Diagnosis   Palpable right breast lump (irregular density) by ultrasound 1.3 cm at 9:30 position, ultrasound-guided biopsy: Grade 2 IDC no LVI, ER 0%, PR 0%, Ki67 30%, HER2 3+ positive, 1 axillary lymph node with cortical thickening biopsy: Positive   12/27/2022 Cancer Staging   Staging form: Breast, AJCC 8th Edition - Clinical: Stage IIA (cT1c, cN1, cM0, G2, ER-, PR-, HER2+) - Signed by Serena Croissant, MD on 12/27/2022 Stage prefix: Initial diagnosis Histologic grading system: 3 grade system   01/11/2023 Genetic Testing   Negative [facility] CancerNext +RNAinsight Panel.  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 Report date is 01/11/2023.   The [employer] CancerNext+RNAinsight Panel includes sequencing, rearrangement analysis, and RNA analysis for the following 39 genes: APC, ATM, BAP1, BARD1, BMPR1A, BRCA1, BRCA2, BRIP1, CDH1, CDKN2A, CHEK2, FH, FLCN, MET, MLH1, MSH2, MSH6, MUTYH, NF1, NTHL1, PALB2, PMS2, PTEN, RAD51C, RAD51D, SMAD4, STK11, TP53, TSC1, TSC2, and VHL (sequencing and deletion/duplication); AXIN2, HOXB13, MBD4, MSH3, POLD1 and POLE (sequencing only); EPCAM and GREM1 (deletion/duplication only).    01/12/2023 -  Chemotherapy   Patient is on Treatment Plan : BREAST  Docetaxel + Carboplatin + Trastuzumab +  Pertuzumab  (TCHP) q21d        CHIEF COMPLIANT: Cycle 2 TCHP  HISTORY OF PRESENT ILLNESS:   History of Present Illness   The patient, with a history of breast cancer, presents for a follow-up after her recent chemotherapy treatment. She reports experiencing chest discomfort and back pain after eating a greasy meal and lying down immediately after. She describes the discomfort as being in the throat and esophagus, and it was severe enough to prompt a call to the on-call provider. The provider recommended an ER visit to rule out a blood clot. At the ER, a CT scan revealed a small amount of fluid on the lungs, but it was not deemed significant. The patient also reports having a cold, for which she was prescribed a Z-Pak.  The patient also experienced diarrhea at the beginning of her treatment, which was managed with Imodium. She had a reaction to the nausea medication Emend during her chemotherapy treatment, which caused abdominal discomfort, shortness of breath, and facial flushing. The medication was discontinued, and the patient reports not needing any of the prescribed nausea medications.  The patient also mentions a prickly sensation in the breast area, which she describes as sporadic. She also reports feeling a little bit of pain in the same area. She is also considering surgical options for her breast cancer and is concerned about the potential for reconstruction after a mastectomy.         ALLERGIES:  is allergic to [facility] dimeglumine].  MEDICATIONS:  Current Outpatient Medications  Medication Sig Dispense Refill   ferrous gluconate (FERGON) 324 MG tablet Take 1 tablet (324 mg total) by mouth 2 (two) times daily with a meal. 60 tablet 6  ALPRAZolam (XANAX) 0.5 MG tablet Take 1 tablet (0.5 mg total) by mouth at bedtime as needed for anxiety or sleep. 20 tablet 0   azithromycin (ZITHROMAX Z-PAK) 250 MG tablet Per package insert 6 each 0   cetirizine (ZYRTEC) 10 MG chewable  tablet Zyrtec     dexamethasone (DECADRON) 4 MG tablet Take 2 tabs by mouth 2 times daily starting day before chemo. Then take 2 tabs daily for 2 days starting day after chemo. Take with food. 30 tablet 1   IUD'S IU IUD's     lidocaine-prilocaine (EMLA) cream Apply to affected area once 30 g 3   ondansetron (ZOFRAN) 8 MG tablet Take 1 tablet (8 mg total) by mouth every 8 (eight) hours as needed for nausea or vomiting. Start on the third day after chemotherapy. 30 tablet 1   oxyCODONE (OXY IR/ROXICODONE) 5 MG immediate release tablet Take 1 tablet (5 mg total) by mouth every 6 (six) hours as needed for severe pain (pain score 7-10). 15 tablet 0   prochlorperazine (COMPAZINE) 10 MG tablet Take 1 tablet (10 mg total) by mouth every 6 (six) hours as needed for nausea or vomiting. 30 tablet 1   No current facility-administered medications for this visit.    PHYSICAL EXAMINATION: ECOG PERFORMANCE STATUS: 1 - Symptomatic but completely ambulatory  Vitals:   02/01/23 0806  BP: (!) 139/93  Pulse: (!) 109  Resp: 18  Temp: 98.2 F (36.8 C)  SpO2: 100%   Filed Weights   02/01/23 0806  Weight: 281 lb (127.5 kg)      LABORATORY DATA:  I have reviewed the data as listed    Latest Ref Rng & Units 01/18/2023    9:49 AM 01/15/2023    7:32 AM 01/12/2023    8:07 AM  CMP  Glucose 70 - 99 mg/dL 621  308  657   BUN 6 - 20 mg/dL 14  12  12    Creatinine 0.44 - 1.00 mg/dL 8.46  9.62  9.52   Sodium 135 - 145 mmol/L 138  133  136   Potassium 3.5 - 5.1 mmol/L 3.4  3.7  3.9   Chloride 98 - 111 mmol/L 104  103  105   CO2 22 - 32 mmol/L 27  23  25    Calcium 8.9 - 10.3 mg/dL 9.3  8.9  9.6   Total Protein 6.5 - 8.1 g/dL 7.5   7.8   Total Bilirubin 0.0 - 1.2 mg/dL 0.3   0.2   Alkaline Phos 38 - 126 U/L 64   82   AST 15 - 41 U/L 15   22   ALT 0 - 44 U/L 22   44     Lab Results  Component Value Date   WBC 6.2 02/01/2023   HGB 9.2 (L) 02/01/2023   HCT 30.5 (L) 02/01/2023   MCV 71.9 (L) 02/01/2023    PLT 213 02/01/2023   NEUTROABS 4.4 02/01/2023    ASSESSMENT & PLAN:  Malignant neoplasm of upper-outer quadrant of right breast in female, estrogen receptor negative (HCC) 12/18/2022:Palpable right breast lump (irregular density) by ultrasound 1.3 cm at 9:30 position, ultrasound-guided biopsy: Grade 2 IDC no LVI, ER 0%, PR 0%, Ki67 30%, HER2 3+ positive, 1 axillary lymph node with cortical thickening biopsy: Positive   Breast MRI 01/08/2023: 1.6 cm mass, 2.4 cm non-mass enhancement: Biopsy: DCIS  Treatment plan: Neoadjuvant chemotherapy with TCH Perjeta followed by maintenance therapy based on pathologic response Breast conserving surgery (1 versus  double lumpectomies) with targeted node dissection Adjuvant radiation therapy ----------------------------------------------------------------------------------------------------------------------------------------------- Current treatment: Cycle 2 TCHP Chemo toxicities: Hypersensitivity reaction to Emend (shortness of breath, abdominal pain) Chest pain on day 2: Went to emergency room, CT angiogram negative: Possibly acid reflux Fatigue day 4-5 Microcytic anemia: I sent a prescription for iron supplementation.  IV fluids to be given on Saturday 1 week after chemo. Return to clinic in 3 weeks for cycle 3    No orders of the defined types were placed in this encounter.  The patient has a good understanding of the overall plan. she agrees with it. she will call with any problems that may develop before the next visit here. Total time spent: 30 mins including face to face time and time spent for planning, charting and co-ordination of care   Tamsen Meek, MD 02/01/23

## 2023-02-01 NOTE — Assessment & Plan Note (Signed)
12/18/2022:Palpable right breast lump (irregular density) by ultrasound 1.3 cm at 9:30 position, ultrasound-guided biopsy: Grade 2 IDC no LVI, ER 0%, PR 0%, Ki67 30%, HER2 3+ positive, 1 axillary lymph node with cortical thickening biopsy: Positive   Treatment plan: Neoadjuvant chemotherapy with TCH Perjeta followed by maintenance therapy based on pathologic response Breast conserving surgery with targeted node dissection Adjuvant radiation therapy ----------------------------------------------------------------------------------------------------------------------------------------------- Current treatment: Cycle 2 TCHP Chemo toxicities: Hypersensitivity reaction to Emend (shortness of breath, abdominal pain) Chest pain on day 2: Went to emergency room, CT angiogram negative Fatigue day 4-5  Return to clinic in 3 weeks for cycle 3

## 2023-02-01 NOTE — Patient Instructions (Signed)
[facility] - A DEPT OF [facility]  Discharge Instructions: Thank you for choosing [facility] to provide your oncology and hematology care.   If you have a lab appointment with the Cancer Center, please go directly to the Cancer Center and check in at the registration area.   Wear comfortable clothing and clothing appropriate for easy access to any Portacath or PICC line.   We strive to give you quality time with your provider. You may need to reschedule your appointment if you arrive late (15 or more minutes).  Arriving late affects you and other patients whose appointments are after yours.  Also, if you miss three or more appointments without notifying the office, you may be dismissed from the clinic at the provider's discretion.      For prescription refill requests, have your pharmacy contact our office and allow 72 hours for refills to be completed.    Today you received the following chemotherapy and/or immunotherapy agents: Trastuzumab, Pertuzumab, Docetaxel, Carboplatin      To help prevent nausea and vomiting after your treatment, we encourage you to take your nausea medication as directed.  BELOW ARE SYMPTOMS THAT SHOULD BE REPORTED IMMEDIATELY: *FEVER GREATER THAN 100.4 F (38 C) OR HIGHER *CHILLS OR SWEATING *NAUSEA AND VOMITING THAT IS NOT CONTROLLED WITH YOUR NAUSEA MEDICATION *UNUSUAL SHORTNESS OF BREATH *UNUSUAL BRUISING OR BLEEDING *URINARY PROBLEMS (pain or burning when urinating, or frequent urination) *BOWEL PROBLEMS (unusual diarrhea, constipation, pain near the anus) TENDERNESS IN MOUTH AND THROAT WITH OR WITHOUT PRESENCE OF ULCERS (sore throat, sores in mouth, or a toothache) UNUSUAL RASH, SWELLING OR PAIN  UNUSUAL VAGINAL DISCHARGE OR ITCHING   Items with * indicate a potential emergency and should be followed up as soon as possible or go to the Emergency Department if any problems should occur.  Please show the  CHEMOTHERAPY ALERT CARD or IMMUNOTHERAPY ALERT CARD at check-in to the Emergency Department and triage nurse.  Should you have questions after your visit or need to cancel or reschedule your appointment, please contact [facility] - A DEPT OF [facility]  Dept: [phone]  and follow the prompts.  Office hours are 8:00 a.m. to 4:30 p.m. Monday - Friday. Please note that voicemails left after 4:00 p.m. may not be returned until the following business day.  We are closed weekends and major holidays. You have access to a nurse at all times for urgent questions. Please call the main number to the clinic Dept: [phone] and follow the prompts.   For any non-urgent questions, you may also contact your provider using MyChart. We now offer e-Visits for anyone 57 and older to request care online for non-urgent symptoms. For details visit [email or web address].   Also download the MyChart app! Go to the app store, search "MyChart", open the app, select [place], and log in with your MyChart username and password.

## 2023-02-02 ENCOUNTER — Inpatient Hospital Stay: Payer: BC Managed Care – PPO

## 2023-02-02 ENCOUNTER — Encounter: Payer: Self-pay | Admitting: Hematology and Oncology

## 2023-02-02 ENCOUNTER — Inpatient Hospital Stay: Payer: BC Managed Care – PPO | Admitting: Hematology and Oncology

## 2023-02-03 ENCOUNTER — Inpatient Hospital Stay: Payer: BC Managed Care – PPO

## 2023-02-03 VITALS — BP 130/90 | HR 89 | Temp 98.5°F | Resp 17

## 2023-02-03 DIAGNOSIS — Z171 Estrogen receptor negative status [ER-]: Secondary | ICD-10-CM

## 2023-02-03 DIAGNOSIS — R5383 Other fatigue: Secondary | ICD-10-CM | POA: Diagnosis not present

## 2023-02-03 DIAGNOSIS — R197 Diarrhea, unspecified: Secondary | ICD-10-CM | POA: Diagnosis not present

## 2023-02-03 DIAGNOSIS — Z87440 Personal history of urinary (tract) infections: Secondary | ICD-10-CM | POA: Diagnosis not present

## 2023-02-03 DIAGNOSIS — Z7952 Long term (current) use of systemic steroids: Secondary | ICD-10-CM | POA: Diagnosis not present

## 2023-02-03 DIAGNOSIS — Z5189 Encounter for other specified aftercare: Secondary | ICD-10-CM | POA: Diagnosis not present

## 2023-02-03 DIAGNOSIS — C50411 Malignant neoplasm of upper-outer quadrant of right female breast: Secondary | ICD-10-CM | POA: Diagnosis not present

## 2023-02-03 DIAGNOSIS — D509 Iron deficiency anemia, unspecified: Secondary | ICD-10-CM | POA: Diagnosis not present

## 2023-02-03 DIAGNOSIS — R0789 Other chest pain: Secondary | ICD-10-CM | POA: Diagnosis not present

## 2023-02-03 DIAGNOSIS — Z5112 Encounter for antineoplastic immunotherapy: Secondary | ICD-10-CM | POA: Diagnosis not present

## 2023-02-03 DIAGNOSIS — Z79899 Other long term (current) drug therapy: Secondary | ICD-10-CM | POA: Diagnosis not present

## 2023-02-03 DIAGNOSIS — Z5111 Encounter for antineoplastic chemotherapy: Secondary | ICD-10-CM | POA: Diagnosis not present

## 2023-02-03 MED ORDER — PEGFILGRASTIM-JMDB 6 MG/0.6ML ~~LOC~~ SOSY
6.0000 mg | PREFILLED_SYRINGE | Freq: Once | SUBCUTANEOUS | Status: AC
Start: 2023-02-03 — End: 2023-02-03
  Administered 2023-02-03: 6 mg via SUBCUTANEOUS

## 2023-02-05 ENCOUNTER — Ambulatory Visit: Payer: BC Managed Care – PPO

## 2023-02-09 ENCOUNTER — Other Ambulatory Visit: Payer: Self-pay | Admitting: *Deleted

## 2023-02-09 DIAGNOSIS — C50411 Malignant neoplasm of upper-outer quadrant of right female breast: Secondary | ICD-10-CM

## 2023-02-09 MED ORDER — ALPRAZOLAM 0.5 MG PO TABS: 0.5000 mg | ORAL_TABLET | Freq: Every evening | ORAL | 0 refills | Status: DC | PRN

## 2023-02-09 NOTE — Telephone Encounter (Signed)
Received call from patient requesting a refill on her Xanax 0.5mg .  Phoned in refill to [facility] and left pt. A VM that this is being filled.

## 2023-02-10 ENCOUNTER — Inpatient Hospital Stay: Payer: BC Managed Care – PPO

## 2023-02-10 VITALS — BP 135/92 | HR 92 | Temp 97.7°F | Resp 17

## 2023-02-10 DIAGNOSIS — C50411 Malignant neoplasm of upper-outer quadrant of right female breast: Secondary | ICD-10-CM

## 2023-02-10 DIAGNOSIS — Z5111 Encounter for antineoplastic chemotherapy: Secondary | ICD-10-CM | POA: Diagnosis not present

## 2023-02-10 DIAGNOSIS — R197 Diarrhea, unspecified: Secondary | ICD-10-CM | POA: Diagnosis not present

## 2023-02-10 DIAGNOSIS — Z79899 Other long term (current) drug therapy: Secondary | ICD-10-CM | POA: Diagnosis not present

## 2023-02-10 DIAGNOSIS — Z95828 Presence of other vascular implants and grafts: Secondary | ICD-10-CM

## 2023-02-10 DIAGNOSIS — Z5112 Encounter for antineoplastic immunotherapy: Secondary | ICD-10-CM | POA: Diagnosis not present

## 2023-02-10 DIAGNOSIS — R5383 Other fatigue: Secondary | ICD-10-CM | POA: Diagnosis not present

## 2023-02-10 DIAGNOSIS — Z171 Estrogen receptor negative status [ER-]: Secondary | ICD-10-CM

## 2023-02-10 DIAGNOSIS — Z7952 Long term (current) use of systemic steroids: Secondary | ICD-10-CM | POA: Diagnosis not present

## 2023-02-10 DIAGNOSIS — R0789 Other chest pain: Secondary | ICD-10-CM | POA: Diagnosis not present

## 2023-02-10 DIAGNOSIS — D509 Iron deficiency anemia, unspecified: Secondary | ICD-10-CM | POA: Diagnosis not present

## 2023-02-10 DIAGNOSIS — Z5189 Encounter for other specified aftercare: Secondary | ICD-10-CM | POA: Diagnosis not present

## 2023-02-10 DIAGNOSIS — Z87440 Personal history of urinary (tract) infections: Secondary | ICD-10-CM | POA: Diagnosis not present

## 2023-02-10 MED ORDER — ALTEPLASE 2 MG IJ SOLR
2.0000 mg | Freq: Once | INTRAMUSCULAR | Status: DC
  Filled 2023-02-10: qty 2

## 2023-02-10 MED ORDER — SODIUM CHLORIDE 0.9% FLUSH
10.0000 mL | Freq: Once | INTRAVENOUS | Status: AC
  Administered 2023-02-10: 10 mL

## 2023-02-10 MED ORDER — HEPARIN SOD (PORK) LOCK FLUSH 100 UNIT/ML IV SOLN
500.0000 [IU] | Freq: Once | INTRAVENOUS | Status: AC
  Administered 2023-02-10: 500 [IU]

## 2023-02-10 MED ORDER — SODIUM CHLORIDE 0.9 % IV SOLN: INTRAVENOUS | Status: DC

## 2023-02-10 NOTE — Patient Instructions (Signed)

## 2023-02-12 ENCOUNTER — Telehealth: Payer: Self-pay

## 2023-02-12 NOTE — Telephone Encounter (Signed)
Left voicemail notifying patient that her [employer] FMLA documents had been completed and faxed back to company. Fax confirmation received. Copy of documents mailed to patient.

## 2023-02-15 ENCOUNTER — Encounter: Payer: Self-pay | Admitting: Hematology and Oncology

## 2023-02-17 ENCOUNTER — Encounter: Payer: Self-pay | Admitting: Hematology and Oncology

## 2023-02-19 ENCOUNTER — Telehealth: Payer: Self-pay | Admitting: *Deleted

## 2023-02-19 NOTE — Telephone Encounter (Addendum)
02/15/2022 Late entry.   02/15/2023 this nurse completed [employer] claim no.#: [id number] received 02/14/2023.  Placed in collaborative pick up bin for provider review, signature and return to this nurse to complete forms process.  Received form today.  Before returning to [employer], need to confirm type of leave and request the [facility] Authorization for use and disclosure as no cover sheet or ROI received with fax or noted in patient files.  Connected with Frieda Arnall, [phone] (home) , "Are you calling about the form?  Why is this taking so long.  (Asked if she will sign ROI on next scheduled appointment 02/22/2023.)  I didn't have to sign anything before, why do I need to sign an authorization now.  I work for [employer] part-time, and this is slowing up my leave.  Yes, this is an intermittent leave.  I spoke with [employer] claim manager an hour ago.  No ROI should be needed as they do not need any physical records and do not need another form."  Unable to establish or explain why the initial ROI request is today.  Advised of policy per H.I.P.A.A guidelines to obtain ROI for release of protected health information in any form (Electronic, verbal or paper).  Leadership advice to form staff is proceed with forms process request within our 7-10-day (14 calendar) timeframe while working to obtain ROI for timelier processing.  ROI needed for patient files.  Will check to see if duplicate and claim numbers, unaware of a previous form and they may not have received what was previously sent.   "It is a different claim.  It was changed from continuous to intermittent. (Every request is assigned a claim number and requires supporting documentation.) I will sign authorization on 02/21/2022."   No further questions or needs.   Noted claim no.#: [id number] form staff received 02/09/2023 and completed 02/12/2023 by forms CMA.  Additional claim no.#: [id number] form staff received 12/29/2022 completed 01/03/2023.by forms CMA.   Proceeded to send form to [employer] per protocol.   Envelope addressed to patient for pick-up with ROI in ABC-folder near registrar number one for patient pick up on 02/22/2023.  Found previous envelope in ABC-folder with ROI for signature, both fastened together with an elastic band.

## 2023-02-21 ENCOUNTER — Telehealth: Payer: Self-pay | Admitting: Adult Health

## 2023-02-21 NOTE — Telephone Encounter (Signed)
 Pt called to verify appt for 2/6 @915  labs, md visit and infusion appts.

## 2023-02-22 ENCOUNTER — Encounter: Payer: Self-pay | Admitting: Adult Health

## 2023-02-22 ENCOUNTER — Inpatient Hospital Stay: Payer: BC Managed Care – PPO | Attending: Hematology and Oncology

## 2023-02-22 ENCOUNTER — Inpatient Hospital Stay: Payer: BC Managed Care – PPO | Admitting: Adult Health

## 2023-02-22 ENCOUNTER — Inpatient Hospital Stay: Payer: BC Managed Care – PPO

## 2023-02-22 ENCOUNTER — Encounter: Payer: Self-pay | Admitting: General Practice

## 2023-02-22 VITALS — BP 139/87 | HR 92 | Temp 97.6°F | Resp 16 | Wt 282.5 lb

## 2023-02-22 VITALS — BP 128/91 | HR 84 | Temp 98.4°F | Resp 16

## 2023-02-22 DIAGNOSIS — D509 Iron deficiency anemia, unspecified: Secondary | ICD-10-CM | POA: Insufficient documentation

## 2023-02-22 DIAGNOSIS — Z87891 Personal history of nicotine dependence: Secondary | ICD-10-CM | POA: Diagnosis not present

## 2023-02-22 DIAGNOSIS — C50411 Malignant neoplasm of upper-outer quadrant of right female breast: Secondary | ICD-10-CM

## 2023-02-22 DIAGNOSIS — N760 Acute vaginitis: Secondary | ICD-10-CM | POA: Insufficient documentation

## 2023-02-22 DIAGNOSIS — Z9221 Personal history of antineoplastic chemotherapy: Secondary | ICD-10-CM | POA: Insufficient documentation

## 2023-02-22 DIAGNOSIS — Z800 Family history of malignant neoplasm of digestive organs: Secondary | ICD-10-CM | POA: Insufficient documentation

## 2023-02-22 DIAGNOSIS — Z171 Estrogen receptor negative status [ER-]: Secondary | ICD-10-CM | POA: Insufficient documentation

## 2023-02-22 DIAGNOSIS — Z7952 Long term (current) use of systemic steroids: Secondary | ICD-10-CM | POA: Insufficient documentation

## 2023-02-22 DIAGNOSIS — R197 Diarrhea, unspecified: Secondary | ICD-10-CM | POA: Diagnosis not present

## 2023-02-22 DIAGNOSIS — R011 Cardiac murmur, unspecified: Secondary | ICD-10-CM | POA: Insufficient documentation

## 2023-02-22 DIAGNOSIS — Z95828 Presence of other vascular implants and grafts: Secondary | ICD-10-CM

## 2023-02-22 DIAGNOSIS — R439 Unspecified disturbances of smell and taste: Secondary | ICD-10-CM | POA: Insufficient documentation

## 2023-02-22 DIAGNOSIS — Z79899 Other long term (current) drug therapy: Secondary | ICD-10-CM | POA: Insufficient documentation

## 2023-02-22 DIAGNOSIS — Z803 Family history of malignant neoplasm of breast: Secondary | ICD-10-CM | POA: Diagnosis not present

## 2023-02-22 DIAGNOSIS — Z5189 Encounter for other specified aftercare: Secondary | ICD-10-CM | POA: Diagnosis not present

## 2023-02-22 DIAGNOSIS — D72819 Decreased white blood cell count, unspecified: Secondary | ICD-10-CM | POA: Insufficient documentation

## 2023-02-22 DIAGNOSIS — Z5111 Encounter for antineoplastic chemotherapy: Secondary | ICD-10-CM | POA: Insufficient documentation

## 2023-02-22 LAB — CBC WITH DIFFERENTIAL (CANCER CENTER ONLY)
Abs Immature Granulocytes: 0.02 10*3/uL (ref 0.00–0.07)
Basophils Absolute: 0 10*3/uL (ref 0.0–0.1)
Basophils Relative: 0 %
Eosinophils Absolute: 0 10*3/uL (ref 0.0–0.5)
Eosinophils Relative: 0 %
HCT: 30.9 % — ABNORMAL LOW (ref 36.0–46.0)
Hemoglobin: 9.5 g/dL — ABNORMAL LOW (ref 12.0–15.0)
Immature Granulocytes: 0 %
Lymphocytes Relative: 9 %
Lymphs Abs: 0.5 10*3/uL — ABNORMAL LOW (ref 0.7–4.0)
MCH: 22.9 pg — ABNORMAL LOW (ref 26.0–34.0)
MCHC: 30.7 g/dL (ref 30.0–36.0)
MCV: 74.5 fL — ABNORMAL LOW (ref 80.0–100.0)
Monocytes Absolute: 0.3 10*3/uL (ref 0.1–1.0)
Monocytes Relative: 5 %
Neutro Abs: 5.2 10*3/uL (ref 1.7–7.7)
Neutrophils Relative %: 86 %
Platelet Count: 165 10*3/uL (ref 150–400)
RBC: 4.15 MIL/uL (ref 3.87–5.11)
RDW: 22.8 % — ABNORMAL HIGH (ref 11.5–15.5)
WBC Count: 6 10*3/uL (ref 4.0–10.5)
nRBC: 0 % (ref 0.0–0.2)

## 2023-02-22 LAB — CMP (CANCER CENTER ONLY)
ALT: 31 U/L (ref 0–44)
AST: 17 U/L (ref 15–41)
Albumin: 4 g/dL (ref 3.5–5.0)
Alkaline Phosphatase: 64 U/L (ref 38–126)
Anion gap: 4 — ABNORMAL LOW (ref 5–15)
BUN: 13 mg/dL (ref 6–20)
CO2: 25 mmol/L (ref 22–32)
Calcium: 9.6 mg/dL (ref 8.9–10.3)
Chloride: 107 mmol/L (ref 98–111)
Creatinine: 0.74 mg/dL (ref 0.44–1.00)
GFR, Estimated: >60 mL/min (ref >60)
Glucose, Bld: 132 mg/dL — ABNORMAL HIGH (ref 70–99)
Potassium: 3.9 mmol/L (ref 3.5–5.1)
Sodium: 136 mmol/L (ref 135–145)
Total Bilirubin: 0.2 mg/dL (ref 0.0–1.2)
Total Protein: 7.4 g/dL (ref 6.5–8.1)

## 2023-02-22 LAB — PREGNANCY, URINE: Preg Test, Ur: NEGATIVE

## 2023-02-22 MED ORDER — TRASTUZUMAB-ANNS CHEMO 150 MG IV SOLR
6.0000 mg/kg | Freq: Once | INTRAVENOUS | Status: AC
  Administered 2023-02-22: 840 mg via INTRAVENOUS
  Filled 2023-02-22: qty 40

## 2023-02-22 MED ORDER — DEXAMETHASONE SODIUM PHOSPHATE 10 MG/ML IJ SOLN
10.0000 mg | Freq: Once | INTRAMUSCULAR | Status: AC
Start: 2023-02-22 — End: 2023-02-22
  Administered 2023-02-22: 10 mg via INTRAVENOUS
  Filled 2023-02-22: qty 1

## 2023-02-22 MED ORDER — SODIUM CHLORIDE 0.9 % IV SOLN
800.0000 mg | Freq: Once | INTRAVENOUS | Status: AC
  Administered 2023-02-22: 800 mg via INTRAVENOUS
  Filled 2023-02-22: qty 80

## 2023-02-22 MED ORDER — SODIUM CHLORIDE 0.9 % IV SOLN: INTRAVENOUS | Status: DC

## 2023-02-22 MED ORDER — SODIUM CHLORIDE 0.9 % IV SOLN
75.0000 mg/m2 | Freq: Once | INTRAVENOUS | Status: AC
  Administered 2023-02-22: 186 mg via INTRAVENOUS
  Filled 2023-02-22: qty 18.6

## 2023-02-22 MED ORDER — PALONOSETRON HCL INJECTION 0.25 MG/5ML
0.2500 mg | Freq: Once | INTRAVENOUS | Status: AC
  Administered 2023-02-22: 0.25 mg via INTRAVENOUS
  Filled 2023-02-22: qty 5

## 2023-02-22 MED ORDER — SODIUM CHLORIDE 0.9% FLUSH
10.0000 mL | INTRAVENOUS | Status: DC | PRN
  Administered 2023-02-22: 10 mL

## 2023-02-22 MED ORDER — HEPARIN SOD (PORK) LOCK FLUSH 100 UNIT/ML IV SOLN
500.0000 [IU] | Freq: Once | INTRAVENOUS | Status: AC | PRN
  Administered 2023-02-22: 500 [IU]

## 2023-02-22 MED ORDER — SODIUM CHLORIDE 0.9% FLUSH
10.0000 mL | Freq: Once | INTRAVENOUS | Status: AC
  Administered 2023-02-22: 10 mL

## 2023-02-22 MED ORDER — DIPHENHYDRAMINE HCL 25 MG PO CAPS
50.0000 mg | ORAL_CAPSULE | Freq: Once | ORAL | Status: AC
  Administered 2023-02-22: 50 mg via ORAL
  Filled 2023-02-22: qty 2

## 2023-02-22 MED ORDER — PERTUZUMAB CHEMO INJECTION 420 MG/14ML
420.0000 mg | Freq: Once | INTRAVENOUS | Status: AC
  Administered 2023-02-22: 420 mg via INTRAVENOUS
  Filled 2023-02-22: qty 14

## 2023-02-22 MED ORDER — ACETAMINOPHEN 325 MG PO TABS
650.0000 mg | ORAL_TABLET | Freq: Once | ORAL | Status: AC
Start: 2023-02-22 — End: 2023-02-22
  Administered 2023-02-22: 650 mg via ORAL
  Filled 2023-02-22: qty 2

## 2023-02-22 MED ORDER — ALTEPLASE 2 MG IJ SOLR
2.0000 mg | Freq: Once | INTRAMUSCULAR | Status: AC
Start: 2023-02-22 — End: 2023-02-22
  Administered 2023-02-22: 2 mg
  Filled 2023-02-22: qty 2

## 2023-02-22 NOTE — Progress Notes (Signed)
 [facility] Cancer Follow up:    Kara Crigler, MD [address] [place] [address]   DIAGNOSIS:  Cancer Staging  Malignant neoplasm of upper-outer quadrant of right breast in female, estrogen receptor negative (HCC) Staging form: Breast, AJCC 8th Edition - Clinical: Stage IIA (cT1c, cN1, cM0, G2, ER-, PR-, HER2+) - Signed by Odean Potts, MD on 12/27/2022 Stage prefix: Initial diagnosis Histologic grading system: 3 grade system   SUMMARY OF ONCOLOGIC HISTORY: Oncology History  Malignant neoplasm of upper-outer quadrant of right breast in female, estrogen receptor negative (HCC)  12/18/2022 Initial Diagnosis   Palpable right breast lump (irregular density) by ultrasound 1.3 cm at 9:30 position, ultrasound-guided biopsy: Grade 2 IDC no LVI, ER 0%, PR 0%, Ki67 30%, HER2 3+ positive, 1 axillary lymph node with cortical thickening biopsy: Positive   12/27/2022 Cancer Staging   Staging form: Breast, AJCC 8th Edition - Clinical: Stage IIA (cT1c, cN1, cM0, G2, ER-, PR-, HER2+) - Signed by Odean Potts, MD on 12/27/2022 Stage prefix: Initial diagnosis Histologic grading system: 3 grade system   01/11/2023 Genetic Testing   Negative [facility] CancerNext +RNAinsight Panel.  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 Report date is 01/11/2023.   The [facility] CancerNext+RNAinsight Panel includes sequencing, rearrangement analysis, and RNA analysis for the following 39 genes: APC, ATM, BAP1, BARD1, BMPR1A, BRCA1, BRCA2, BRIP1, CDH1, CDKN2A, CHEK2, FH, FLCN, MET, MLH1, MSH2, MSH6, MUTYH, NF1, NTHL1, PALB2, PMS2, PTEN, RAD51C, RAD51D, SMAD4, STK11, TP53, TSC1, TSC2, and VHL (sequencing and deletion/duplication); AXIN2, HOXB13, MBD4, MSH3, POLD1 and POLE (sequencing only); EPCAM and GREM1 (deletion/duplication only).    01/12/2023 -  Chemotherapy   Patient is on Treatment Plan : BREAST  Docetaxel  + Carboplatin  + Trastuzumab  + Pertuzumab   (TCHP) q21d        CURRENT THERAPY: TCHP cycle  3  INTERVAL HISTORY: Kara Webb 43 y.o. female, undergoing chemotherapy for breast cancer, reports a mix of symptoms and side effects from the treatment. She mentions experiencing diarrhea, for which she has been taking Imodium and Pepto-Bismol. She also reports a loss of taste, which typically returns about six days after treatment. The patient notes that her menstrual cycle, which had previously stopped, has returned, causing some distress. She also mentions taking iron supplements, which seem to be helping with her overall health. The patient has a positive outlook and is actively trying to manage her symptoms, including trying to maintain a healthy diet and staying active. She also mentions having a strong support system, which includes meal deliveries and emotional support.   Patient Active Problem List   Diagnosis Date Noted   Port-A-Cath in place 01/12/2023   Genetic testing 01/12/2023   Family history of breast cancer 12/27/2022   Family history of colon cancer 12/27/2022   Malignant neoplasm of upper-outer quadrant of right breast in female, estrogen receptor negative (HCC) 12/25/2022    is allergic to [facility] [fosaprepitant  dimeglumine].  MEDICAL HISTORY: Past Medical History:  Diagnosis Date   Breast cancer (HCC)    BV (bacterial vaginosis)    Cold intolerance 01/16/2005   Fatigue 01/16/2005   H/O chlamydia infection 08/12/2010   H/O urinary frequency 11/16/2000   H/O vaginal discharge 11/17/2003   H/O varicella    Heart murmur    Born with murmur   HSV-1 infection    Hx: UTI (urinary tract infection)    Pelvic pain    Vulvovaginal candidiasis 04/16/2005   Yeast infection     SURGICAL HISTORY: Past Surgical History:  Procedure Laterality Date   BREAST BIOPSY Right 12/18/2022   US  RT BREAST BX W LOC DEV 1ST LESION IMG BX SPEC US  GUIDE 12/18/2022 [facility] MAMMOGRAPHY   PORTACATH PLACEMENT N/A 01/03/2023   Procedure: INSERTION PORT-A-CATH WITH ULTRASOUND ANDREA;   Surgeon: Vanderbilt Ned, MD;  Location: [facility];  Service: General;  Laterality: N/A;   TOE SURGERY  2004   WISDOM TOOTH EXTRACTION  2003    SOCIAL HISTORY: Social History   Socioeconomic History   Marital status: Single    Spouse name: Not on file   Number of children: Not on file   Years of education: Not on file   Highest education level: Not on file  Occupational History   Not on file  Tobacco Use   Smoking status: Never   Smokeless tobacco: Never  Vaping Use   Vaping status: Former  Substance and Sexual Activity   Alcohol use: No   Drug use: No   Sexual activity: Yes    Birth control/protection: None  Other Topics Concern   Not on file  Social History Narrative   Not on file   Social Drivers of Health   Financial Resource Strain: Not on file  Food Insecurity: No Food Insecurity (12/27/2022)   Hunger Vital Sign    Worried About Running Out of Food in the Last Year: Never true    Ran Out of Food in the Last Year: Never true  Transportation Needs: No Transportation Needs (12/27/2022)   PRAPARE - Administrator, Civil Service (Medical): No    Lack of Transportation (Non-Medical): No  Physical Activity: Not on file  Stress: Not on file  Social Connections: Not on file  Intimate Partner Violence: Not At Risk (12/27/2022)   Humiliation, Afraid, Rape, and Kick questionnaire    Fear of Current or Ex-Partner: No    Emotionally Abused: No    Physically Abused: No    Sexually Abused: No    FAMILY HISTORY: Family History  Problem Relation Age of Onset   Breast cancer Maternal Aunt 67   Colon cancer Maternal Aunt 50   Colon cancer Maternal Uncle 65   Hypertension Maternal Grandmother    Diabetes Maternal Grandmother    Hypertension Maternal Grandfather    Breast cancer Paternal Grandmother        dx mid 83s-70s    Review of Systems  Constitutional:  Positive for fatigue. Negative for appetite change, chills, fever and unexpected  weight change.  HENT:   Negative for hearing loss, lump/mass, mouth sores and trouble swallowing.   Eyes:  Negative for eye problems and icterus.  Respiratory:  Negative for chest tightness, cough and shortness of breath.   Cardiovascular:  Negative for chest pain, leg swelling and palpitations.  Gastrointestinal:  Negative for abdominal distention, abdominal pain, constipation, diarrhea, nausea and vomiting.  Endocrine: Negative for hot flashes.  Genitourinary:  Negative for difficulty urinating.   Musculoskeletal:  Negative for arthralgias.  Skin:  Negative for itching and rash.  Neurological:  Negative for dizziness, extremity weakness, headaches and numbness.  Hematological:  Negative for adenopathy. Does not bruise/bleed easily.  Psychiatric/Behavioral:  Negative for depression. The patient is not nervous/anxious.       PHYSICAL EXAMINATION    Vitals:   02/22/23 1019  BP: 139/87  Pulse: 92  Resp: 16  Temp: 97.6 F (36.4 C)  SpO2: 100%    Physical Exam Constitutional:      General: She is not in acute  distress.    Appearance: Normal appearance. She is not toxic-appearing.  HENT:     Head: Normocephalic and atraumatic.     Mouth/Throat:     Mouth: Mucous membranes are moist.     Pharynx: Oropharynx is clear. No oropharyngeal exudate or posterior oropharyngeal erythema.  Eyes:     General: No scleral icterus. Cardiovascular:     Rate and Rhythm: Normal rate and regular rhythm.     Pulses: Normal pulses.     Heart sounds: Normal heart sounds.  Pulmonary:     Effort: Pulmonary effort is normal.     Breath sounds: Normal breath sounds.  Abdominal:     General: Abdomen is flat. Bowel sounds are normal. There is no distension.     Palpations: Abdomen is soft.     Tenderness: There is no abdominal tenderness.  Musculoskeletal:        General: No swelling.     Cervical back: Neck supple.  Lymphadenopathy:     Cervical: No cervical adenopathy.  Skin:    General:  Skin is warm and dry.     Findings: No rash.  Neurological:     General: No focal deficit present.     Mental Status: She is alert.  Psychiatric:        Mood and Affect: Mood normal.        Behavior: Behavior normal.     LABORATORY DATA:  CBC    Component Value Date/Time   WBC 6.0 02/22/2023 0947   WBC 3.2 (L) 01/15/2023 0732   RBC 4.15 02/22/2023 0947   HGB 9.5 (L) 02/22/2023 0947   HCT 30.9 (L) 02/22/2023 0947   PLT 165 02/22/2023 0947   MCV 74.5 (L) 02/22/2023 0947   MCH 22.9 (L) 02/22/2023 0947   MCHC 30.7 02/22/2023 0947   RDW 22.8 (H) 02/22/2023 0947   LYMPHSABS 0.5 (L) 02/22/2023 0947   MONOABS 0.3 02/22/2023 0947   EOSABS 0.0 02/22/2023 0947   BASOSABS 0.0 02/22/2023 0947    CMP     Component Value Date/Time   NA 136 02/22/2023 0947   K 3.9 02/22/2023 0947   CL 107 02/22/2023 0947   CO2 25 02/22/2023 0947   GLUCOSE 132 (H) 02/22/2023 0947   BUN 13 02/22/2023 0947   CREATININE 0.74 02/22/2023 0947   CALCIUM 9.6 02/22/2023 0947   PROT 7.4 02/22/2023 0947   ALBUMIN 4.0 02/22/2023 0947   AST 17 02/22/2023 0947   ALT 31 02/22/2023 0947   ALKPHOS 64 02/22/2023 0947   BILITOT 0.2 02/22/2023 0947   GFRNONAA >60 02/22/2023 0947   GFRAA >60 09/25/2018 0052           ASSESSMENT and THERAPY PLAN:   Malignant neoplasm of upper-outer quadrant of right breast in female, estrogen receptor negative (HCC) 12/18/2022:Palpable right breast lump (irregular density) by ultrasound 1.3 cm at 9:30 position, ultrasound-guided biopsy: Grade 2 IDC no LVI, ER 0%, PR 0%, Ki67 30%, HER2 3+ positive, 1 axillary lymph node with cortical thickening biopsy: Positive   Treatment plan: Neoadjuvant chemotherapy with TCH Perjeta  followed by maintenance therapy based on pathologic response Breast conserving surgery with targeted node dissection Adjuvant radiation  therapy ----------------------------------------------------------------------------------------------------------------------------------------------- Current treatment: Cycle 3 TCHP Chemo toxicities: Breast Cancer Undergoing chemotherapy with good tolerance. Reports feeling sluggish for a few days post-injection but recovers well. No numbness or tingling in fingers or toes. -Continue current chemotherapy regimen. -Labs stable, no dose reductions or alterations needed. -IV fluids on 2/15 for hydration (order  for 1L NS over an hour in signed and held orders)  Gastrointestinal Side Effects Reports intermittent diarrhea, managed with Imodium and Pepto-Bismol. -Continue current management with Imodium and Pepto-Bismol as needed.  Menstrual Cycle Resumption of menstrual cycle after a period of amenorrhea. -Observe and report if problematic.  Iron Deficiency Taking iron supplements with improvement in hemoglobin and MCV levels. -Continue iron supplements as tolerated.  RTC for injection and IV fluids as scheduled and again in 3 weeks for labs, f/u, and cycle four of therapy.     All questions were answered. The patient knows to call the clinic with any problems, questions or concerns. We can certainly see the patient much sooner if necessary.  Total encounter time:30 minutes*in face-to-face visit time, chart review, lab review, care coordination, order entry, and documentation of the encounter time.    Morna Kendall, NP 02/22/23 12:30 PM Medical Oncology and Hematology [facility] [address] [place], [place] [address] Tel. [phone]    Fax. [phone]  *Total Encounter Time as defined by the Centers for Medicare and Medicaid Services includes, in addition to the face-to-face time of a patient visit (documented in the note above) non-face-to-face time: obtaining and reviewing outside history, ordering and reviewing medications, tests or procedures, care  coordination (communications with other health care professionals or caregivers) and documentation in the medical record.

## 2023-02-22 NOTE — Progress Notes (Signed)
 [facility] Spiritual Care Note  Followed up with Kara Webb in infusion because she had been apprehensive about treatment. Today she was very upbeat, expressing gratitude for great support from family/friends and noting that she is processing the loss of her hair by taking control of her styles each step of the way (from close-cut to wigs). Kara Webb also notes that she is not feeling as sick as she feared she might, so recovery from each round of treatment has felt manageable, especially with the company of friends and family to offer car and support.  Provided compassionate presence, reflective listening, affirmation of strengths, and celebration of gifts. We also talked about looking forward to a Bell-Ringing Ceremony to celebrate the end of treatment and plan to discuss possibilities in more detail. Will email a copy of the suggested program as a starting point, and Kara Webb has direct Spiritual Care number for follow-up.   [address] [place], [place], [facility] Pager [phone] Voicemail [phone]

## 2023-02-22 NOTE — Patient Instructions (Signed)
 [facility] - A DEPT OF [facility] \  Discharge Instructions: Thank you for choosing [facility] to provide your oncology and hematology care.   If you have a lab appointment with the Cancer Center, please go directly to the Cancer Center and check in at the registration area.   Wear comfortable clothing and clothing appropriate for easy access to any Portacath or PICC line.   We strive to give you quality time with your provider. You may need to reschedule your appointment if you arrive late (15 or more minutes).  Arriving late affects you and other patients whose appointments are after yours.  Also, if you miss three or more appointments without notifying the office, you may be dismissed from the clinic at the provider's discretion.      For prescription refill requests, have your pharmacy contact our office and allow 72 hours for refills to be completed.    Today you received the following chemotherapy and/or immunotherapy agents: Trastuzumab (Kanjinti), Pertuzumab (Perjeta), Docetaxel (Taxotere), and Carboplatin       To help prevent nausea and vomiting after your treatment, we encourage you to take your nausea medication as directed.  BELOW ARE SYMPTOMS THAT SHOULD BE REPORTED IMMEDIATELY: *FEVER GREATER THAN 100.4 F (38 C) OR HIGHER *CHILLS OR SWEATING *NAUSEA AND VOMITING THAT IS NOT CONTROLLED WITH YOUR NAUSEA MEDICATION *UNUSUAL SHORTNESS OF BREATH *UNUSUAL BRUISING OR BLEEDING *URINARY PROBLEMS (pain or burning when urinating, or frequent urination) *BOWEL PROBLEMS (unusual diarrhea, constipation, pain near the anus) TENDERNESS IN MOUTH AND THROAT WITH OR WITHOUT PRESENCE OF ULCERS (sore throat, sores in mouth, or a toothache) UNUSUAL RASH, SWELLING OR PAIN  UNUSUAL VAGINAL DISCHARGE OR ITCHING   Items with * indicate a potential emergency and should be followed up as soon as possible or go to the Emergency Department if any  problems should occur.  Please show the CHEMOTHERAPY ALERT CARD or IMMUNOTHERAPY ALERT CARD at check-in to the Emergency Department and triage nurse.  Should you have questions after your visit or need to cancel or reschedule your appointment, please contact [facility] A DE[facility]  Dept: [phone]  and follow the prompts.  Office hours are 8:00 a.m. to 4:30 p.m. Monday - Friday. Please note that voicemails left after 4:00 p.m. may not be returned until the following business day.  We are closed weekends and major holidays. You have access to a nurse at all times for urgent questions. Please call the main number to the clinic Dept: [phone] and follow the prompts.   For any non-urgent questions, you may also contact your provider using MyChart. We now offer e-Visits for anyone 2 and older to request care online for non-urgent symptoms. For details visit [email or web address].   Also download the MyChart app! Go to the app store, search "MyChart", open the app, select [facility], and log in with your MyChart username and password.

## 2023-02-22 NOTE — Assessment & Plan Note (Signed)
 12/18/2022:Palpable right breast lump (irregular density) by ultrasound 1.3 cm at 9:30 position, ultrasound-guided biopsy: Grade 2 IDC no LVI, ER 0%, PR 0%, Ki67 30%, HER2 3+ positive, 1 axillary lymph node with cortical thickening biopsy: Positive   Treatment plan: Neoadjuvant chemotherapy with TCH Perjeta  followed by maintenance therapy based on pathologic response Breast conserving surgery with targeted node dissection Adjuvant radiation therapy ----------------------------------------------------------------------------------------------------------------------------------------------- Current treatment: Cycle 3 TCHP Chemo toxicities: Breast Cancer Undergoing chemotherapy with good tolerance. Reports feeling sluggish for a few days post-injection but recovers well. No numbness or tingling in fingers or toes. -Continue current chemotherapy regimen. -Labs stable, no dose reductions or alterations needed. -IV fluids on 2/15 for hydration (order for 1L NS over an hour in signed and held orders)  Gastrointestinal Side Effects Reports intermittent diarrhea, managed with Imodium and Pepto-Bismol. -Continue current management with Imodium and Pepto-Bismol as needed.  Menstrual Cycle Resumption of menstrual cycle after a period of amenorrhea. -Observe and report if problematic.  Iron Deficiency Taking iron supplements with improvement in hemoglobin and MCV levels. -Continue iron supplements as tolerated.  RTC for injection and IV fluids as scheduled and again in 3 weeks for labs, f/u, and cycle four of therapy.

## 2023-02-23 ENCOUNTER — Telehealth: Payer: Self-pay | Admitting: Adult Health

## 2023-02-23 NOTE — Telephone Encounter (Signed)
Scheduled appointment per 2/6 los. Patient is aware of the made appointment.

## 2023-02-24 ENCOUNTER — Inpatient Hospital Stay: Payer: BC Managed Care – PPO

## 2023-02-24 VITALS — BP 132/87 | HR 87 | Temp 99.0°F | Resp 18

## 2023-02-24 DIAGNOSIS — D72819 Decreased white blood cell count, unspecified: Secondary | ICD-10-CM | POA: Diagnosis not present

## 2023-02-24 DIAGNOSIS — R439 Unspecified disturbances of smell and taste: Secondary | ICD-10-CM | POA: Diagnosis not present

## 2023-02-24 DIAGNOSIS — Z9221 Personal history of antineoplastic chemotherapy: Secondary | ICD-10-CM | POA: Diagnosis not present

## 2023-02-24 DIAGNOSIS — Z5111 Encounter for antineoplastic chemotherapy: Secondary | ICD-10-CM | POA: Diagnosis not present

## 2023-02-24 DIAGNOSIS — N760 Acute vaginitis: Secondary | ICD-10-CM | POA: Diagnosis not present

## 2023-02-24 DIAGNOSIS — R197 Diarrhea, unspecified: Secondary | ICD-10-CM | POA: Diagnosis not present

## 2023-02-24 DIAGNOSIS — Z5189 Encounter for other specified aftercare: Secondary | ICD-10-CM | POA: Diagnosis not present

## 2023-02-24 DIAGNOSIS — Z87891 Personal history of nicotine dependence: Secondary | ICD-10-CM | POA: Diagnosis not present

## 2023-02-24 DIAGNOSIS — R011 Cardiac murmur, unspecified: Secondary | ICD-10-CM | POA: Diagnosis not present

## 2023-02-24 DIAGNOSIS — Z800 Family history of malignant neoplasm of digestive organs: Secondary | ICD-10-CM | POA: Diagnosis not present

## 2023-02-24 DIAGNOSIS — C50411 Malignant neoplasm of upper-outer quadrant of right female breast: Secondary | ICD-10-CM | POA: Diagnosis not present

## 2023-02-24 DIAGNOSIS — Z7952 Long term (current) use of systemic steroids: Secondary | ICD-10-CM | POA: Diagnosis not present

## 2023-02-24 DIAGNOSIS — D509 Iron deficiency anemia, unspecified: Secondary | ICD-10-CM | POA: Diagnosis not present

## 2023-02-24 DIAGNOSIS — Z171 Estrogen receptor negative status [ER-]: Secondary | ICD-10-CM

## 2023-02-24 DIAGNOSIS — Z79899 Other long term (current) drug therapy: Secondary | ICD-10-CM | POA: Diagnosis not present

## 2023-02-24 DIAGNOSIS — Z803 Family history of malignant neoplasm of breast: Secondary | ICD-10-CM | POA: Diagnosis not present

## 2023-02-24 MED ORDER — PEGFILGRASTIM-JMDB 6 MG/0.6ML ~~LOC~~ SOSY
6.0000 mg | PREFILLED_SYRINGE | Freq: Once | SUBCUTANEOUS | Status: AC
  Administered 2023-02-24: 6 mg via SUBCUTANEOUS
  Filled 2023-02-24: qty 0.6

## 2023-02-24 NOTE — Patient Instructions (Signed)

## 2023-03-03 ENCOUNTER — Inpatient Hospital Stay: Payer: BC Managed Care – PPO

## 2023-03-03 VITALS — BP 133/92 | HR 98 | Temp 98.8°F | Resp 16

## 2023-03-03 DIAGNOSIS — D72819 Decreased white blood cell count, unspecified: Secondary | ICD-10-CM | POA: Diagnosis not present

## 2023-03-03 DIAGNOSIS — Z171 Estrogen receptor negative status [ER-]: Secondary | ICD-10-CM | POA: Diagnosis not present

## 2023-03-03 DIAGNOSIS — C50411 Malignant neoplasm of upper-outer quadrant of right female breast: Secondary | ICD-10-CM

## 2023-03-03 DIAGNOSIS — Z7952 Long term (current) use of systemic steroids: Secondary | ICD-10-CM | POA: Diagnosis not present

## 2023-03-03 DIAGNOSIS — Z5111 Encounter for antineoplastic chemotherapy: Secondary | ICD-10-CM | POA: Diagnosis not present

## 2023-03-03 DIAGNOSIS — Z95828 Presence of other vascular implants and grafts: Secondary | ICD-10-CM

## 2023-03-03 DIAGNOSIS — Z79899 Other long term (current) drug therapy: Secondary | ICD-10-CM | POA: Diagnosis not present

## 2023-03-03 DIAGNOSIS — R439 Unspecified disturbances of smell and taste: Secondary | ICD-10-CM | POA: Diagnosis not present

## 2023-03-03 DIAGNOSIS — Z800 Family history of malignant neoplasm of digestive organs: Secondary | ICD-10-CM | POA: Diagnosis not present

## 2023-03-03 DIAGNOSIS — Z9221 Personal history of antineoplastic chemotherapy: Secondary | ICD-10-CM | POA: Diagnosis not present

## 2023-03-03 DIAGNOSIS — R011 Cardiac murmur, unspecified: Secondary | ICD-10-CM | POA: Diagnosis not present

## 2023-03-03 DIAGNOSIS — Z803 Family history of malignant neoplasm of breast: Secondary | ICD-10-CM | POA: Diagnosis not present

## 2023-03-03 DIAGNOSIS — Z5189 Encounter for other specified aftercare: Secondary | ICD-10-CM | POA: Diagnosis not present

## 2023-03-03 DIAGNOSIS — D509 Iron deficiency anemia, unspecified: Secondary | ICD-10-CM | POA: Diagnosis not present

## 2023-03-03 DIAGNOSIS — R197 Diarrhea, unspecified: Secondary | ICD-10-CM | POA: Diagnosis not present

## 2023-03-03 DIAGNOSIS — N760 Acute vaginitis: Secondary | ICD-10-CM | POA: Diagnosis not present

## 2023-03-03 DIAGNOSIS — Z87891 Personal history of nicotine dependence: Secondary | ICD-10-CM | POA: Diagnosis not present

## 2023-03-03 MED ORDER — HEPARIN SOD (PORK) LOCK FLUSH 100 UNIT/ML IV SOLN
500.0000 [IU] | Freq: Once | INTRAVENOUS | Status: AC
  Administered 2023-03-03: 500 [IU]

## 2023-03-03 MED ORDER — SODIUM CHLORIDE 0.9% FLUSH
10.0000 mL | Freq: Once | INTRAVENOUS | Status: AC
  Administered 2023-03-03: 10 mL

## 2023-03-03 MED ORDER — SODIUM CHLORIDE 0.9 % IV SOLN: INTRAVENOUS | Status: DC

## 2023-03-09 ENCOUNTER — Telehealth: Payer: Self-pay | Admitting: *Deleted

## 2023-03-09 ENCOUNTER — Telehealth: Payer: Self-pay | Admitting: Hematology and Oncology

## 2023-03-09 NOTE — Telephone Encounter (Signed)
 Scheduled appointments per WQ. Patient is aware of the made appointments.

## 2023-03-09 NOTE — Telephone Encounter (Signed)
 Received call from pt with complaint of nasal congestion.  Pt denies cough, fever, or nasal drainage at this time but is requesting advice from MD.  Per MD pt to take OTC congestion relief medication.  Pt educated to contact our office if symptoms become worse, fever develops or productive cough develops.  Pt verbalized understanding.

## 2023-03-10 ENCOUNTER — Other Ambulatory Visit: Payer: Self-pay

## 2023-03-15 ENCOUNTER — Inpatient Hospital Stay: Payer: BC Managed Care – PPO

## 2023-03-15 ENCOUNTER — Inpatient Hospital Stay (HOSPITAL_BASED_OUTPATIENT_CLINIC_OR_DEPARTMENT_OTHER): Payer: BC Managed Care – PPO | Admitting: Hematology and Oncology

## 2023-03-15 ENCOUNTER — Telehealth: Payer: Self-pay | Admitting: Hematology and Oncology

## 2023-03-15 ENCOUNTER — Other Ambulatory Visit: Payer: Self-pay | Admitting: *Deleted

## 2023-03-15 VITALS — BP 121/90 | HR 100 | Temp 97.9°F | Resp 17 | Ht 66.0 in | Wt 279.9 lb

## 2023-03-15 DIAGNOSIS — Z95828 Presence of other vascular implants and grafts: Secondary | ICD-10-CM

## 2023-03-15 DIAGNOSIS — Z5111 Encounter for antineoplastic chemotherapy: Secondary | ICD-10-CM | POA: Diagnosis not present

## 2023-03-15 DIAGNOSIS — Z800 Family history of malignant neoplasm of digestive organs: Secondary | ICD-10-CM | POA: Diagnosis not present

## 2023-03-15 DIAGNOSIS — Z171 Estrogen receptor negative status [ER-]: Secondary | ICD-10-CM | POA: Diagnosis not present

## 2023-03-15 DIAGNOSIS — Z7952 Long term (current) use of systemic steroids: Secondary | ICD-10-CM | POA: Diagnosis not present

## 2023-03-15 DIAGNOSIS — C50411 Malignant neoplasm of upper-outer quadrant of right female breast: Secondary | ICD-10-CM

## 2023-03-15 DIAGNOSIS — Z87891 Personal history of nicotine dependence: Secondary | ICD-10-CM | POA: Diagnosis not present

## 2023-03-15 DIAGNOSIS — Z79899 Other long term (current) drug therapy: Secondary | ICD-10-CM | POA: Diagnosis not present

## 2023-03-15 DIAGNOSIS — Z803 Family history of malignant neoplasm of breast: Secondary | ICD-10-CM | POA: Diagnosis not present

## 2023-03-15 DIAGNOSIS — Z9221 Personal history of antineoplastic chemotherapy: Secondary | ICD-10-CM | POA: Diagnosis not present

## 2023-03-15 DIAGNOSIS — D72819 Decreased white blood cell count, unspecified: Secondary | ICD-10-CM | POA: Diagnosis not present

## 2023-03-15 DIAGNOSIS — D509 Iron deficiency anemia, unspecified: Secondary | ICD-10-CM | POA: Diagnosis not present

## 2023-03-15 DIAGNOSIS — R197 Diarrhea, unspecified: Secondary | ICD-10-CM | POA: Diagnosis not present

## 2023-03-15 DIAGNOSIS — R011 Cardiac murmur, unspecified: Secondary | ICD-10-CM | POA: Diagnosis not present

## 2023-03-15 DIAGNOSIS — R439 Unspecified disturbances of smell and taste: Secondary | ICD-10-CM | POA: Diagnosis not present

## 2023-03-15 DIAGNOSIS — N760 Acute vaginitis: Secondary | ICD-10-CM | POA: Diagnosis not present

## 2023-03-15 DIAGNOSIS — Z5189 Encounter for other specified aftercare: Secondary | ICD-10-CM | POA: Diagnosis not present

## 2023-03-15 LAB — CBC WITH DIFFERENTIAL (CANCER CENTER ONLY)
Abs Immature Granulocytes: 0.01 10*3/uL (ref 0.00–0.07)
Basophils Absolute: 0 10*3/uL (ref 0.0–0.1)
Basophils Relative: 0 %
Eosinophils Absolute: 0 10*3/uL (ref 0.0–0.5)
Eosinophils Relative: 0 %
HCT: 30.5 % — ABNORMAL LOW (ref 36.0–46.0)
Hemoglobin: 9.2 g/dL — ABNORMAL LOW (ref 12.0–15.0)
Immature Granulocytes: 0 %
Lymphocytes Relative: 33 %
Lymphs Abs: 0.8 10*3/uL (ref 0.7–4.0)
MCH: 24 pg — ABNORMAL LOW (ref 26.0–34.0)
MCHC: 30.2 g/dL (ref 30.0–36.0)
MCV: 79.6 fL — ABNORMAL LOW (ref 80.0–100.0)
Monocytes Absolute: 0.3 10*3/uL (ref 0.1–1.0)
Monocytes Relative: 15 %
Neutro Abs: 1.2 10*3/uL — ABNORMAL LOW (ref 1.7–7.7)
Neutrophils Relative %: 52 %
Platelet Count: 182 10*3/uL (ref 150–400)
RBC: 3.83 MIL/uL — ABNORMAL LOW (ref 3.87–5.11)
RDW: 25.4 % — ABNORMAL HIGH (ref 11.5–15.5)
WBC Count: 2.3 10*3/uL — ABNORMAL LOW (ref 4.0–10.5)
nRBC: 0 % (ref 0.0–0.2)

## 2023-03-15 LAB — CMP (CANCER CENTER ONLY)
ALT: 19 U/L (ref 0–44)
AST: 16 U/L (ref 15–41)
Albumin: 3.8 g/dL (ref 3.5–5.0)
Alkaline Phosphatase: 71 U/L (ref 38–126)
Anion gap: 3 — ABNORMAL LOW (ref 5–15)
BUN: 12 mg/dL (ref 6–20)
CO2: 30 mmol/L (ref 22–32)
Calcium: 9.3 mg/dL (ref 8.9–10.3)
Chloride: 108 mmol/L (ref 98–111)
Creatinine: 0.91 mg/dL (ref 0.44–1.00)
GFR, Estimated: >60 mL/min (ref >60)
Glucose, Bld: 103 mg/dL — ABNORMAL HIGH (ref 70–99)
Potassium: 3.8 mmol/L (ref 3.5–5.1)
Sodium: 141 mmol/L (ref 135–145)
Total Bilirubin: 0.2 mg/dL (ref 0.0–1.2)
Total Protein: 7.1 g/dL (ref 6.5–8.1)

## 2023-03-15 LAB — PREGNANCY, URINE: Preg Test, Ur: NEGATIVE

## 2023-03-15 MED ORDER — SODIUM CHLORIDE 0.9% FLUSH
10.0000 mL | Freq: Once | INTRAVENOUS | Status: AC
  Administered 2023-03-15: 10 mL

## 2023-03-15 MED ORDER — ALPRAZOLAM 0.5 MG PO TABS: 0.5000 mg | ORAL_TABLET | Freq: Every evening | ORAL | 0 refills | Status: DC | PRN

## 2023-03-15 MED ORDER — HEPARIN SOD (PORK) LOCK FLUSH 100 UNIT/ML IV SOLN
500.0000 [IU] | Freq: Once | INTRAVENOUS | Status: AC
  Administered 2023-03-15: 500 [IU]

## 2023-03-15 NOTE — Progress Notes (Signed)
 Patient Care Team: [name], MD as PCP - General (Family Medicine) Donnelly Angelica, RN as Oncology Nurse Navigator Lu Duffel, Margretta Ditty, RN as Oncology Nurse Navigator Antony Blackbird, MD as Consulting Physician (Radiation Oncology) Serena Croissant, MD as Consulting Physician (Hematology and Oncology) Harriette Bouillon, MD as Consulting Physician (General Surgery)  DIAGNOSIS:  Encounter Diagnosis  Name Primary?   Malignant neoplasm of upper-outer quadrant of right breast in female, estrogen receptor negative (HCC) Yes    SUMMARY OF ONCOLOGIC HISTORY: Oncology History  Malignant neoplasm of upper-outer quadrant of right breast in female, estrogen receptor negative (HCC)  12/18/2022 Initial Diagnosis   Palpable right breast lump (irregular density) by ultrasound 1.3 cm at 9:30 position, ultrasound-guided biopsy: Grade 2 IDC no LVI, ER 0%, PR 0%, Ki67 30%, HER2 3+ positive, 1 axillary lymph node with cortical thickening biopsy: Positive   12/27/2022 Cancer Staging   Staging form: Breast, AJCC 8th Edition - Clinical: Stage IIA (cT1c, cN1, cM0, G2, ER-, PR-, HER2+) - Signed by Serena Croissant, MD on 12/27/2022 Stage prefix: Initial diagnosis Histologic grading system: 3 grade system   01/11/2023 Genetic Testing   Negative [facility] CancerNext +RNAinsight Panel.  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 Report date is 01/11/2023.   The [facility] CancerNext+RNAinsight Panel includes sequencing, rearrangement analysis, and RNA analysis for the following 39 genes: APC, ATM, BAP1, BARD1, BMPR1A, BRCA1, BRCA2, BRIP1, CDH1, CDKN2A, CHEK2, FH, FLCN, MET, MLH1, MSH2, MSH6, MUTYH, NF1, NTHL1, PALB2, PMS2, PTEN, RAD51C, RAD51D, SMAD4, STK11, TP53, TSC1, TSC2, and VHL (sequencing and deletion/duplication); AXIN2, HOXB13, MBD4, MSH3, POLD1 and POLE (sequencing only); EPCAM and GREM1 (deletion/duplication only).    01/12/2023 -  Chemotherapy   Patient is on Treatment Plan : BREAST  Docetaxel + Carboplatin + Trastuzumab +  Pertuzumab  (TCHP) q21d        CHIEF COMPLIANT: Cycle 4 TCHP tomorrow at [facility]  HISTORY OF PRESENT ILLNESS: Annalyse Langlais is a 43 year old female undergoing chemotherapy who presents for follow-up regarding treatment side effects.  She experiences esophageal discomfort specifically after consuming beef, such as ground beef or steak. This discomfort occurs only during the first week following her chemotherapy treatment and resolves on its own. She has identified beef as the trigger and avoids it, which has prevented further discomfort.  She experiences diarrhea typically in the week following her chemotherapy treatments. She manages this with Imodium, which effectively controls the symptoms. She had an episode of loose stools after consuming a meal with salmon, rice, green beans, and cabbage, which she attributes to the high oil content. She did not take Imodium for this episode, and the symptoms resolved on their own. She avoids greasy foods and prefers to air fry her meals to prevent gastrointestinal upset.  She received a Neulasta injection two days after her last chemotherapy treatment. Her white blood cell count is lower than expected. Her hemoglobin levels indicate anemia, but she has remained stable. Her platelet count is good, and her neutrophil count is at the desired level. No numbness or tingling in her fingers.    ALLERGIES:  is allergic to [facility] dimeglumine].  MEDICATIONS:  Current Outpatient Medications  Medication Sig Dispense Refill   dexamethasone (DECADRON) 4 MG tablet Take 2 tabs by mouth 2 times daily starting day before chemo. Then take 2 tabs daily for 2 days starting day after chemo. Take with food. 30 tablet 1   ferrous gluconate (FERGON) 324 MG tablet Take 1 tablet (324 mg total) by mouth 2 (  two) times daily with a meal. 60 tablet 6   IUD'S IU IUD's     lidocaine-prilocaine (EMLA) cream Apply to affected area once 30 g 3   ondansetron (ZOFRAN) 8 MG  tablet Take 1 tablet (8 mg total) by mouth every 8 (eight) hours as needed for nausea or vomiting. Start on the third day after chemotherapy. 30 tablet 1   prochlorperazine (COMPAZINE) 10 MG tablet Take 1 tablet (10 mg total) by mouth every 6 (six) hours as needed for nausea or vomiting. 30 tablet 1   ALPRAZolam (XANAX) 0.5 MG tablet Take 1 tablet (0.5 mg total) by mouth at bedtime as needed for anxiety or sleep. 20 tablet 0   No current facility-administered medications for this visit.    PHYSICAL EXAMINATION: ECOG PERFORMANCE STATUS: 1 - Symptomatic but completely ambulatory  Vitals:   03/15/23 1030  BP: (!) 121/90  Pulse: 100  Resp: 17  Temp: 97.9 F (36.6 C)  SpO2: 100%   Filed Weights   03/15/23 1030  Weight: 279 lb 14.4 oz (127 kg)      LABORATORY DATA:  I have reviewed the data as listed    Latest Ref Rng & Units 03/15/2023    9:53 AM 02/22/2023    9:47 AM 02/01/2023    7:49 AM  CMP  Glucose 70 - 99 mg/dL 161  096  045   BUN 6 - 20 mg/dL 12  13  10    Creatinine 0.44 - 1.00 mg/dL 4.09  8.11  9.14   Sodium 135 - 145 mmol/L 141  136  138   Potassium 3.5 - 5.1 mmol/L 3.8  3.9  3.6   Chloride 98 - 111 mmol/L 108  107  106   CO2 22 - 32 mmol/L 30  25  26    Calcium 8.9 - 10.3 mg/dL 9.3  9.6  9.5   Total Protein 6.5 - 8.1 g/dL 7.1  7.4  7.0   Total Bilirubin 0.0 - 1.2 mg/dL 0.2  0.2  0.3   Alkaline Phos 38 - 126 U/L 71  64  55   AST 15 - 41 U/L 16  17  9    ALT 0 - 44 U/L 19  31  10      Lab Results  Component Value Date   WBC 2.3 (L) 03/15/2023   HGB 9.2 (L) 03/15/2023   HCT 30.5 (L) 03/15/2023   MCV 79.6 (L) 03/15/2023   PLT 182 03/15/2023   NEUTROABS 1.2 (L) 03/15/2023    ASSESSMENT & PLAN:  Malignant neoplasm of upper-outer quadrant of right breast in female, estrogen receptor negative (HCC) 12/18/2022:Palpable right breast lump (irregular density) by ultrasound 1.3 cm at 9:30 position, ultrasound-guided biopsy: Grade 2 IDC no LVI, ER 0%, PR 0%, Ki67 30%, HER2  3+ positive, 1 axillary lymph node with cortical thickening biopsy: Positive    Breast MRI 01/08/2023: 1.6 cm mass, 2.4 cm non-mass enhancement: Biopsy: DCIS   Treatment plan: Neoadjuvant chemotherapy with TCH Perjeta followed by maintenance therapy based on pathologic response Breast conserving surgery (1 versus double lumpectomies) with targeted node dissection Adjuvant radiation therapy ----------------------------------------------------------------------------------------------------------------------------------------------- Current treatment: Cycle 4 TCHP Chemo toxicities: Hypersensitivity reaction to Emend (shortness of breath, abdominal pain) Chest pain on day 2: Went to emergency room, CT angiogram negative: Possibly acid reflux Fatigue day 4-5 Microcytic anemia: I sent a prescription for iron supplementation.   Severe leukopenia: Unclear why the counts are low in spite of Neulasta. I would like her to get a  CBC checked tomorrow before the start of her treatment. It is fine to proceed with the treatment with an ANC of 1.2. But the repeat CBC will be double check on the ANC   Return to clinic in 3 weeks for cycle 5    No orders of the defined types were placed in this encounter.  The patient has a good understanding of the overall plan. she agrees with it. she will call with any problems that may develop before the next visit here. Total time spent: 30 mins including face to face time and time spent for planning, charting and co-ordination of care   Tamsen Meek, MD 03/15/23

## 2023-03-15 NOTE — Telephone Encounter (Signed)
 Rescheduled appointment per 2/27 los. Left VM with appointment details.

## 2023-03-15 NOTE — Assessment & Plan Note (Signed)
 12/18/2022:Palpable right breast lump (irregular density) by ultrasound 1.3 cm at 9:30 position, ultrasound-guided biopsy: Grade 2 IDC no LVI, ER 0%, PR 0%, Ki67 30%, HER2 3+ positive, 1 axillary lymph node with cortical thickening biopsy: Positive    Breast MRI 01/08/2023: 1.6 cm mass, 2.4 cm non-mass enhancement: Biopsy: DCIS   Treatment plan: Neoadjuvant chemotherapy with TCH Perjeta followed by maintenance therapy based on pathologic response Breast conserving surgery (1 versus double lumpectomies) with targeted node dissection Adjuvant radiation therapy ----------------------------------------------------------------------------------------------------------------------------------------------- Current treatment: Cycle 3 TCHP Chemo toxicities: Hypersensitivity reaction to Emend (shortness of breath, abdominal pain) Chest pain on day 2: Went to emergency room, CT angiogram negative: Possibly acid reflux Fatigue day 4-5 Microcytic anemia: I sent a prescription for iron supplementation.   IV fluids to be given on Saturday 1 week after chemo. Return to clinic in 3 weeks for cycle 4

## 2023-03-15 NOTE — Progress Notes (Signed)
 Per MD okay to treat 03/16/23 with ANC of 1.2 on 03/15/23.  MD requesting CBC be redrawn prior to treatment on 03/16/23, orders placed.

## 2023-03-16 ENCOUNTER — Inpatient Hospital Stay: Payer: BC Managed Care – PPO

## 2023-03-16 ENCOUNTER — Encounter: Payer: Self-pay | Admitting: Hematology and Oncology

## 2023-03-16 VITALS — BP 134/91 | HR 96 | Temp 98.7°F | Resp 20

## 2023-03-16 DIAGNOSIS — D509 Iron deficiency anemia, unspecified: Secondary | ICD-10-CM | POA: Diagnosis not present

## 2023-03-16 DIAGNOSIS — C50411 Malignant neoplasm of upper-outer quadrant of right female breast: Secondary | ICD-10-CM

## 2023-03-16 DIAGNOSIS — D72819 Decreased white blood cell count, unspecified: Secondary | ICD-10-CM | POA: Diagnosis not present

## 2023-03-16 DIAGNOSIS — Z95828 Presence of other vascular implants and grafts: Secondary | ICD-10-CM

## 2023-03-16 DIAGNOSIS — Z800 Family history of malignant neoplasm of digestive organs: Secondary | ICD-10-CM | POA: Diagnosis not present

## 2023-03-16 DIAGNOSIS — R011 Cardiac murmur, unspecified: Secondary | ICD-10-CM | POA: Diagnosis not present

## 2023-03-16 DIAGNOSIS — N760 Acute vaginitis: Secondary | ICD-10-CM | POA: Diagnosis not present

## 2023-03-16 DIAGNOSIS — Z5189 Encounter for other specified aftercare: Secondary | ICD-10-CM | POA: Diagnosis not present

## 2023-03-16 DIAGNOSIS — R439 Unspecified disturbances of smell and taste: Secondary | ICD-10-CM | POA: Diagnosis not present

## 2023-03-16 DIAGNOSIS — Z5111 Encounter for antineoplastic chemotherapy: Secondary | ICD-10-CM | POA: Diagnosis not present

## 2023-03-16 DIAGNOSIS — Z171 Estrogen receptor negative status [ER-]: Secondary | ICD-10-CM | POA: Diagnosis not present

## 2023-03-16 DIAGNOSIS — Z803 Family history of malignant neoplasm of breast: Secondary | ICD-10-CM | POA: Diagnosis not present

## 2023-03-16 DIAGNOSIS — Z9221 Personal history of antineoplastic chemotherapy: Secondary | ICD-10-CM | POA: Diagnosis not present

## 2023-03-16 DIAGNOSIS — R197 Diarrhea, unspecified: Secondary | ICD-10-CM | POA: Diagnosis not present

## 2023-03-16 DIAGNOSIS — Z7952 Long term (current) use of systemic steroids: Secondary | ICD-10-CM | POA: Diagnosis not present

## 2023-03-16 DIAGNOSIS — Z87891 Personal history of nicotine dependence: Secondary | ICD-10-CM | POA: Diagnosis not present

## 2023-03-16 DIAGNOSIS — Z79899 Other long term (current) drug therapy: Secondary | ICD-10-CM | POA: Diagnosis not present

## 2023-03-16 LAB — CBC WITH DIFFERENTIAL (CANCER CENTER ONLY)
Abs Immature Granulocytes: 0.01 10*3/uL (ref 0.00–0.07)
Basophils Absolute: 0 10*3/uL (ref 0.0–0.1)
Basophils Relative: 0 %
Eosinophils Absolute: 0 10*3/uL (ref 0.0–0.5)
Eosinophils Relative: 0 %
HCT: 29.8 % — ABNORMAL LOW (ref 36.0–46.0)
Hemoglobin: 8.9 g/dL — ABNORMAL LOW (ref 12.0–15.0)
Immature Granulocytes: 0 %
Lymphocytes Relative: 40 %
Lymphs Abs: 1 10*3/uL (ref 0.7–4.0)
MCH: 23.9 pg — ABNORMAL LOW (ref 26.0–34.0)
MCHC: 29.9 g/dL — ABNORMAL LOW (ref 30.0–36.0)
MCV: 79.9 fL — ABNORMAL LOW (ref 80.0–100.0)
Monocytes Absolute: 0.4 10*3/uL (ref 0.1–1.0)
Monocytes Relative: 15 %
Neutro Abs: 1.1 10*3/uL — ABNORMAL LOW (ref 1.7–7.7)
Neutrophils Relative %: 45 %
Platelet Count: 183 10*3/uL (ref 150–400)
RBC: 3.73 MIL/uL — ABNORMAL LOW (ref 3.87–5.11)
RDW: 25.6 % — ABNORMAL HIGH (ref 11.5–15.5)
WBC Count: 2.4 10*3/uL — ABNORMAL LOW (ref 4.0–10.5)
nRBC: 0 % (ref 0.0–0.2)

## 2023-03-16 MED ORDER — ALTEPLASE 2 MG IJ SOLR
2.0000 mg | Freq: Once | INTRAMUSCULAR | Status: AC
  Administered 2023-03-16: 2 mg
  Filled 2023-03-16: qty 2

## 2023-03-16 MED ORDER — DEXAMETHASONE SODIUM PHOSPHATE 10 MG/ML IJ SOLN
10.0000 mg | Freq: Once | INTRAMUSCULAR | Status: AC
  Administered 2023-03-16: 10 mg via INTRAVENOUS
  Filled 2023-03-16: qty 1

## 2023-03-16 MED ORDER — SODIUM CHLORIDE 0.9% FLUSH
10.0000 mL | INTRAVENOUS | Status: DC | PRN
  Administered 2023-03-16: 10 mL

## 2023-03-16 MED ORDER — SODIUM CHLORIDE 0.9 % IV SOLN
420.0000 mg | Freq: Once | INTRAVENOUS | Status: AC
  Administered 2023-03-16: 420 mg via INTRAVENOUS
  Filled 2023-03-16: qty 14

## 2023-03-16 MED ORDER — TRASTUZUMAB-ANNS CHEMO 150 MG IV SOLR
6.0000 mg/kg | Freq: Once | INTRAVENOUS | Status: AC
  Administered 2023-03-16: 840 mg via INTRAVENOUS
  Filled 2023-03-16: qty 40

## 2023-03-16 MED ORDER — PALONOSETRON HCL INJECTION 0.25 MG/5ML
0.2500 mg | Freq: Once | INTRAVENOUS | Status: AC
  Administered 2023-03-16: 0.25 mg via INTRAVENOUS
  Filled 2023-03-16: qty 5

## 2023-03-16 MED ORDER — HEPARIN SOD (PORK) LOCK FLUSH 100 UNIT/ML IV SOLN
500.0000 [IU] | Freq: Once | INTRAVENOUS | Status: AC | PRN
  Administered 2023-03-16: 500 [IU]

## 2023-03-16 MED ORDER — DIPHENHYDRAMINE HCL 25 MG PO CAPS
50.0000 mg | ORAL_CAPSULE | Freq: Once | ORAL | Status: AC
  Administered 2023-03-16: 50 mg via ORAL
  Filled 2023-03-16: qty 2

## 2023-03-16 MED ORDER — CARBOPLATIN CHEMO INJECTION 450 MG/45ML
150.0000 mg | Freq: Once | INTRAVENOUS | Status: AC
  Administered 2023-03-16: 150 mg via INTRAVENOUS
  Filled 2023-03-16: qty 15

## 2023-03-16 MED ORDER — ACETAMINOPHEN 325 MG PO TABS
650.0000 mg | ORAL_TABLET | Freq: Once | ORAL | Status: AC
  Administered 2023-03-16: 650 mg via ORAL
  Filled 2023-03-16: qty 2

## 2023-03-16 MED ORDER — SODIUM CHLORIDE 0.9 % IV SOLN
50.0000 mg/m2 | Freq: Once | INTRAVENOUS | Status: AC
  Administered 2023-03-16: 124 mg via INTRAVENOUS
  Filled 2023-03-16: qty 12.4

## 2023-03-16 MED ORDER — SODIUM CHLORIDE 0.9 % IV SOLN: INTRAVENOUS | Status: DC

## 2023-03-16 NOTE — Progress Notes (Signed)
 Pt. arrived for tmt. Saw Dr. Pamelia Hoit yesterday and at [facility] for tmt today. Anc 1.2 /wbc 2.3 on 03/15/23.  Pt. stated that she forgot her pre steroid at home. Port accessed without blood return despite various positioning. Pt. stated that this happened in the past and had to receive special meds. Cathflo instilled. Notified Dr. Pamelia Hoit of above. Per MD, no need for extra premed today. Cathflo instilled at 0940hr.  Checked at 1020hr, no blood return.  Checked at 10:36hr. Positive blood returned noted an appr. 5 ml wasted. Mildly sluggish.   Repeat CBC today. ANC 1.1 today, lower taxotere to 50mg /m2 and carbo to [facility] 1 and proceed with the treatment Per md. Rx will adjust doses.  Pt. education completed especially with dose reduction and neutropenia precaution.

## 2023-03-16 NOTE — Progress Notes (Signed)
 ANC 1.1, doses lowered to docetaxel 50 mg/m2 and carboplatin AUC =1 per Dr. Earmon Phoenix instructions.

## 2023-03-16 NOTE — Patient Instructions (Addendum)
 [facility]  Discharge Instructions: Thank you for choosing [facility] to provide your oncology and hematology care.   If you have a lab appointment with the Cancer Center, please go directly to the Cancer Center and check in at the registration area.  Wear comfortable clothing and clothing appropriate for easy access to any Portacath or PICC line.   We strive to give you quality time with your provider. You may need to reschedule your appointment if you arrive late (15 or more minutes).  Arriving late affects you and other patients whose appointments are after yours.  Also, if you miss three or more appointments without notifying the office, you may be dismissed from the clinic at the provider's discretion.      For prescription refill requests, have your pharmacy contact our office and allow 72 hours for refills to be completed.    Today you received the following chemotherapy and/or immunotherapy agents   Docetaxel + Carboplatin + Trastuzumab + Pertuzumab    To help prevent nausea and vomiting after your treatment, we encourage you to take your nausea medication as directed.  BELOW ARE SYMPTOMS THAT SHOULD BE REPORTED IMMEDIATELY: *FEVER GREATER THAN 100.4 F (38 C) OR HIGHER *CHILLS OR SWEATING *NAUSEA AND VOMITING THAT IS NOT CONTROLLED WITH YOUR NAUSEA MEDICATION *UNUSUAL SHORTNESS OF BREATH *UNUSUAL BRUISING OR BLEEDING *URINARY PROBLEMS (pain or burning when urinating, or frequent urination) *BOWEL PROBLEMS (unusual diarrhea, constipation, pain near the anus) TENDERNESS IN MOUTH AND THROAT WITH OR WITHOUT PRESENCE OF ULCERS (sore throat, sores in mouth, or a toothache) UNUSUAL RASH, SWELLING OR PAIN  UNUSUAL VAGINAL DISCHARGE OR ITCHING   Items with * indicate a potential emergency and should be followed up as soon as possible or go to the Emergency Department if any problems should occur.  Please show the  CHEMOTHERAPY ALERT CARD or IMMUNOTHERAPY ALERT CARD at check-in to the Emergency Department and triage nurse. Should you have questions after your visit or need to cancel or reschedule your appointment, please contact [facility] - A DEPT OF [facility]  [phone] and follow the prompts.  Office hours are 8:00 a.m. to 4:30 p.m. Monday - Friday. Please note that voicemails left after 4:00 p.m. may not be returned until the following business day.  We are closed weekends and major holidays. You have access to a nurse at all times for urgent questions. Please call the main number to the clinic [phone] and follow the prompts.  For any non-urgent questions, you may also contact your provider using MyChart. We now offer e-Visits for anyone 72 and older to request care online for non-urgent symptoms. For details visit [email or web address].   Also download the MyChart app! Go to the app store, search "MyChart", open the app, select [place], and log in with your MyChart username and password.

## 2023-03-17 ENCOUNTER — Inpatient Hospital Stay: Payer: BC Managed Care – PPO | Attending: Hematology and Oncology

## 2023-03-17 ENCOUNTER — Inpatient Hospital Stay: Payer: BC Managed Care – PPO

## 2023-03-19 ENCOUNTER — Inpatient Hospital Stay: Payer: BC Managed Care – PPO | Attending: Hematology and Oncology

## 2023-03-19 ENCOUNTER — Telehealth: Payer: Self-pay | Admitting: Hematology and Oncology

## 2023-03-19 VITALS — BP 124/88 | HR 90 | Temp 99.3°F | Resp 16

## 2023-03-19 DIAGNOSIS — R519 Headache, unspecified: Secondary | ICD-10-CM | POA: Insufficient documentation

## 2023-03-19 DIAGNOSIS — Z9221 Personal history of antineoplastic chemotherapy: Secondary | ICD-10-CM | POA: Diagnosis not present

## 2023-03-19 DIAGNOSIS — R197 Diarrhea, unspecified: Secondary | ICD-10-CM | POA: Diagnosis not present

## 2023-03-19 DIAGNOSIS — Z87440 Personal history of urinary (tract) infections: Secondary | ICD-10-CM | POA: Diagnosis not present

## 2023-03-19 DIAGNOSIS — Z79899 Other long term (current) drug therapy: Secondary | ICD-10-CM | POA: Diagnosis not present

## 2023-03-19 DIAGNOSIS — R0981 Nasal congestion: Secondary | ICD-10-CM | POA: Diagnosis not present

## 2023-03-19 DIAGNOSIS — C50411 Malignant neoplasm of upper-outer quadrant of right female breast: Secondary | ICD-10-CM | POA: Insufficient documentation

## 2023-03-19 DIAGNOSIS — Z5189 Encounter for other specified aftercare: Secondary | ICD-10-CM | POA: Diagnosis not present

## 2023-03-19 DIAGNOSIS — E559 Vitamin D deficiency, unspecified: Secondary | ICD-10-CM | POA: Insufficient documentation

## 2023-03-19 DIAGNOSIS — Z171 Estrogen receptor negative status [ER-]: Secondary | ICD-10-CM | POA: Diagnosis not present

## 2023-03-19 DIAGNOSIS — Z5111 Encounter for antineoplastic chemotherapy: Secondary | ICD-10-CM | POA: Insufficient documentation

## 2023-03-19 DIAGNOSIS — R232 Flushing: Secondary | ICD-10-CM | POA: Insufficient documentation

## 2023-03-19 MED ORDER — PEGFILGRASTIM-JMDB 6 MG/0.6ML ~~LOC~~ SOSY
6.0000 mg | PREFILLED_SYRINGE | Freq: Once | SUBCUTANEOUS | Status: AC
  Administered 2023-03-19: 6 mg via SUBCUTANEOUS
  Filled 2023-03-19: qty 0.6

## 2023-03-20 ENCOUNTER — Encounter: Payer: Self-pay | Admitting: Licensed Clinical Social Worker

## 2023-03-20 NOTE — Progress Notes (Signed)
 [facility] CSW Progress Note  Clinical Child psychotherapist  received notification from [name]  that patient was approved for assistance. Patient was also notified.    Meyli Boice E Margarito Dehaas, LCSW Clinical Social Worker [employer]

## 2023-03-23 ENCOUNTER — Other Ambulatory Visit: Payer: Self-pay | Admitting: *Deleted

## 2023-03-23 DIAGNOSIS — Z171 Estrogen receptor negative status [ER-]: Secondary | ICD-10-CM

## 2023-03-24 ENCOUNTER — Inpatient Hospital Stay

## 2023-03-24 VITALS — BP 126/85 | HR 95 | Temp 98.0°F | Resp 17

## 2023-03-24 DIAGNOSIS — Z95828 Presence of other vascular implants and grafts: Secondary | ICD-10-CM

## 2023-03-24 DIAGNOSIS — R519 Headache, unspecified: Secondary | ICD-10-CM | POA: Diagnosis not present

## 2023-03-24 DIAGNOSIS — Z5189 Encounter for other specified aftercare: Secondary | ICD-10-CM | POA: Diagnosis not present

## 2023-03-24 DIAGNOSIS — Z171 Estrogen receptor negative status [ER-]: Secondary | ICD-10-CM | POA: Diagnosis not present

## 2023-03-24 DIAGNOSIS — C50411 Malignant neoplasm of upper-outer quadrant of right female breast: Secondary | ICD-10-CM

## 2023-03-24 DIAGNOSIS — Z87440 Personal history of urinary (tract) infections: Secondary | ICD-10-CM | POA: Diagnosis not present

## 2023-03-24 DIAGNOSIS — Z9221 Personal history of antineoplastic chemotherapy: Secondary | ICD-10-CM | POA: Diagnosis not present

## 2023-03-24 DIAGNOSIS — E559 Vitamin D deficiency, unspecified: Secondary | ICD-10-CM | POA: Diagnosis not present

## 2023-03-24 DIAGNOSIS — R0981 Nasal congestion: Secondary | ICD-10-CM | POA: Diagnosis not present

## 2023-03-24 DIAGNOSIS — R197 Diarrhea, unspecified: Secondary | ICD-10-CM | POA: Diagnosis not present

## 2023-03-24 DIAGNOSIS — Z79899 Other long term (current) drug therapy: Secondary | ICD-10-CM | POA: Diagnosis not present

## 2023-03-24 DIAGNOSIS — R232 Flushing: Secondary | ICD-10-CM | POA: Diagnosis not present

## 2023-03-24 DIAGNOSIS — Z5111 Encounter for antineoplastic chemotherapy: Secondary | ICD-10-CM | POA: Diagnosis not present

## 2023-03-24 MED ORDER — SODIUM CHLORIDE 0.9 % IV SOLN
INTRAVENOUS | Status: DC
Start: 2023-03-24 — End: 2023-03-24

## 2023-03-24 MED ORDER — HEPARIN SOD (PORK) LOCK FLUSH 100 UNIT/ML IV SOLN
500.0000 [IU] | Freq: Once | INTRAVENOUS | Status: AC
  Administered 2023-03-24: 500 [IU]

## 2023-03-24 MED ORDER — SODIUM CHLORIDE 0.9% FLUSH
10.0000 mL | Freq: Once | INTRAVENOUS | Status: AC
Start: 2023-03-24 — End: 2023-03-24
  Administered 2023-03-24: 10 mL

## 2023-03-24 NOTE — Patient Instructions (Signed)

## 2023-03-26 ENCOUNTER — Telehealth: Payer: Self-pay

## 2023-03-26 NOTE — Telephone Encounter (Signed)
 Pt called and states she feels her PAC is tilted and this is new. Denies pain/swelling to the area but does report "mild discomfort" and she is concerned. She was offered appt with Clayborn Heron, NP 03/27/23 at 1230 and accepted. Appt scheduled.

## 2023-03-27 ENCOUNTER — Encounter: Payer: Self-pay | Admitting: Nurse Practitioner

## 2023-03-27 ENCOUNTER — Inpatient Hospital Stay (HOSPITAL_BASED_OUTPATIENT_CLINIC_OR_DEPARTMENT_OTHER): Admitting: Nurse Practitioner

## 2023-03-27 VITALS — BP 138/80 | HR 124 | Temp 97.4°F | Resp 18 | Wt 278.7 lb

## 2023-03-27 DIAGNOSIS — R519 Headache, unspecified: Secondary | ICD-10-CM | POA: Diagnosis not present

## 2023-03-27 DIAGNOSIS — E559 Vitamin D deficiency, unspecified: Secondary | ICD-10-CM | POA: Diagnosis not present

## 2023-03-27 DIAGNOSIS — C50411 Malignant neoplasm of upper-outer quadrant of right female breast: Secondary | ICD-10-CM | POA: Diagnosis not present

## 2023-03-27 DIAGNOSIS — Z171 Estrogen receptor negative status [ER-]: Secondary | ICD-10-CM | POA: Diagnosis not present

## 2023-03-27 DIAGNOSIS — R197 Diarrhea, unspecified: Secondary | ICD-10-CM | POA: Diagnosis not present

## 2023-03-27 DIAGNOSIS — Z79899 Other long term (current) drug therapy: Secondary | ICD-10-CM | POA: Diagnosis not present

## 2023-03-27 DIAGNOSIS — Z95828 Presence of other vascular implants and grafts: Secondary | ICD-10-CM

## 2023-03-27 DIAGNOSIS — Z9221 Personal history of antineoplastic chemotherapy: Secondary | ICD-10-CM | POA: Diagnosis not present

## 2023-03-27 DIAGNOSIS — Z5111 Encounter for antineoplastic chemotherapy: Secondary | ICD-10-CM | POA: Diagnosis not present

## 2023-03-27 DIAGNOSIS — R232 Flushing: Secondary | ICD-10-CM | POA: Diagnosis not present

## 2023-03-27 DIAGNOSIS — Z5189 Encounter for other specified aftercare: Secondary | ICD-10-CM | POA: Diagnosis not present

## 2023-03-27 DIAGNOSIS — R0981 Nasal congestion: Secondary | ICD-10-CM | POA: Diagnosis not present

## 2023-03-27 DIAGNOSIS — Z87440 Personal history of urinary (tract) infections: Secondary | ICD-10-CM | POA: Diagnosis not present

## 2023-03-27 MED ORDER — AZITHROMYCIN 250 MG PO TABS: ORAL_TABLET | ORAL | 0 refills | Status: DC

## 2023-03-27 NOTE — Progress Notes (Signed)
 Patient Care Team: [name], MD as PCP - General (Family Medicine) Donnelly Angelica, RN as Oncology Nurse Navigator Pershing Proud, RN as Oncology Nurse Navigator Antony Blackbird, MD as Consulting Physician (Radiation Oncology) Serena Croissant, MD as Consulting Physician (Hematology and Oncology) Harriette Bouillon, MD as Consulting Physician (General Surgery)   CHIEF COMPLAINT: Concern that her "port flipped"  ONCOLOGY HISTORY Oncology History  Malignant neoplasm of upper-outer quadrant of right breast in female, estrogen receptor negative (HCC)  12/18/2022 Initial Diagnosis   Palpable right breast lump (irregular density) by ultrasound 1.3 cm at 9:30 position, ultrasound-guided biopsy: Grade 2 IDC no LVI, ER 0%, PR 0%, Ki67 30%, HER2 3+ positive, 1 axillary lymph node with cortical thickening biopsy: Positive   12/27/2022 Cancer Staging   Staging form: Breast, AJCC 8th Edition - Clinical: Stage IIA (cT1c, cN1, cM0, G2, ER-, PR-, HER2+) - Signed by Serena Croissant, MD on 12/27/2022 Stage prefix: Initial diagnosis Histologic grading system: 3 grade system   01/11/2023 Genetic Testing   Negative [facility] CancerNext +RNAinsight Panel.  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 Report date is 01/11/2023.   The [employer] CancerNext+RNAinsight Panel includes sequencing, rearrangement analysis, and RNA analysis for the following 39 genes: APC, ATM, BAP1, BARD1, BMPR1A, BRCA1, BRCA2, BRIP1, CDH1, CDKN2A, CHEK2, FH, FLCN, MET, MLH1, MSH2, MSH6, MUTYH, NF1, NTHL1, PALB2, PMS2, PTEN, RAD51C, RAD51D, SMAD4, STK11, TP53, TSC1, TSC2, and VHL (sequencing and deletion/duplication); AXIN2, HOXB13, MBD4, MSH3, POLD1 and POLE (sequencing only); EPCAM and GREM1 (deletion/duplication only).    01/12/2023 -  Chemotherapy   Patient is on Treatment Plan : BREAST  Docetaxel + Carboplatin + Trastuzumab + Pertuzumab  (TCHP) q21d         CURRENT THERAPY: Neoadjuvant TCHP, S/p C4 on 03/16/23  INTERVAL HISTORY Kara Webb  returns prior to scheduled visit for symptom management.  Ran her hands over her port yesterday and felt that it had flipped.  No erythema, swelling, or drainage at the site.  She woke up early this morning after having fallen asleep on the couch with some congestion and headache.  The headache resolved, congestion improved with Benadryl.  Afebrile.  She is not able to stay asleep very well, takes occasional Xanax which helps.  She otherwise tolerated chemo similar to previous although the recovery improves each time.  She feels good on the day of treatment, then taste decreases, she has some diarrhea that is managed with Imodium, and recovers well by day 9.   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    Hx: UTI (urinary tract infection)    Pelvic pain    Vulvovaginal candidiasis 04/16/2005   Yeast infection      Past Surgical History:  Procedure Laterality Date   BREAST BIOPSY Right 12/18/2022   [place] RT BREAST BX W LOC DEV 1ST LESION IMG BX SPEC US GUIDE 12/18/2022 [facility] MAMMOGRAPHY   PORTACATH PLACEMENT N/A 01/03/2023   Procedure: INSERTION PORT-A-CATH WITH ULTRASOUND Julian Reil;  Surgeon: Harriette Bouillon, MD;  Location: [facility];  Service: General;  Laterality: N/A;   TOE SURGERY  2004   WISDOM  TOOTH EXTRACTION  2003     Outpatient Encounter Medications as of 03/27/2023  Medication Sig   ALPRAZolam (XANAX) 0.5 MG tablet Take 1 tablet (0.5 mg total) by mouth at bedtime as needed for anxiety or sleep.   azithromycin (ZITHROMAX Z-PAK) 250 MG tablet Per instructions. Only take if symptoms worsen and flu/covid negative, notify cancer center prior to  starting   dexamethasone (DECADRON) 4 MG tablet Take 2 tabs by mouth 2 times daily starting day before chemo. Then take 2 tabs daily for 2 days starting day after chemo. Take with food.   ferrous gluconate (FERGON) 324 MG tablet Take 1 tablet (324 mg total) by mouth 2 (two) times daily with a meal.   IUD'S IU IUD's   lidocaine-prilocaine (EMLA) cream Apply to affected area once   ondansetron (ZOFRAN) 8 MG tablet Take 1 tablet (8 mg total) by mouth every 8 (eight) hours as needed for nausea or vomiting. Start on the third day after chemotherapy.   prochlorperazine (COMPAZINE) 10 MG tablet Take 1 tablet (10 mg total) by mouth every 6 (six) hours as needed for nausea or vomiting.   No facility-administered encounter medications on file as of 03/27/2023.     Today's Vitals   03/27/23 1317  BP: 138/80  Pulse: (!) 124  Resp: 18  Temp: (!) 97.4 F (36.3 C)  TempSrc: Temporal  SpO2: 99%  Weight: 278 lb 11.2 oz (126.4 kg)  PainSc: 0-No pain   Body mass index is 44.98 kg/m.   PHYSICAL EXAM GENERAL:alert, no distress and comfortable SKIN: no rash  EYES: sclera clear NECK: without mass or edema LYMPH:  no palpable cervical or supraclavicular lymphadenopathy  LUNGS: normal breathing effort HEART: no lower extremity edema NEURO: alert & oriented x 3 with fluent speech, no focal motor/sensory deficits PAC without erythema. Palpable landmarks on the port surface  LAB DATA No labs for this visit     ASSESSMENT & PLAN: 43 year old female  Assessment of Port-A-Cath -Exam reveals a right chest PAC in good position/orientation, no flipped.  -Accessed and flushed by [place] RN; no blood return noted. Will schedule 2 hr TPA prior to chemo next week -If she continues to have issues with blood return, may need IR dye study   Nasal congestion and headache -Onset early a.m. on 3/11, afebrile.  Benadryl helped congestion. HA resolved. -Reviewed symptom management including nasal spray, OTCs,  hydration  -She was neutropenic on cycle 4-day 1, received G-CSF on day 3;  -Reviewed precautions, encouraged her to monitor her temperature and report any new fever/chills, or worsening of her symptoms -Will send in Z-Pak as requested, however she understands to rule out flu/COVID first before starting, as antibiotics would not help if she has a viral illness  Altered sleep -Uses Xanax as needed which is partially effective.  Can try Benadryl and melatonin but not with Xanax  Stage II ER/PR negative HER2 positive right breast cancer -S/p 4 cycles neoadjuvant TCHP, tolerating well, continue current treatment plan as scheduled    PLAN: -Port is not flipped, however no blood return noted. Will schedule TPA  -Symptom management for congestion, headache, sleep, etc -Rx: Zpack if needed but she knows to contact us and r/o virus prior to starting -F/up and cycle 5 TCHP on 3/20 as scheduled     All questions were answered. The patient knows to call the clinic with any problems, questions or concerns. No barriers to learning were detected. I spent 20 minutes counseling the patient face  to face. The total time spent in the appointment was 30 minutes and more than 50% was on counseling, review of test results, and coordination of care.   Santiago Glad, NP-C 03/27/2023

## 2023-04-04 ENCOUNTER — Encounter: Payer: Self-pay | Admitting: *Deleted

## 2023-04-04 DIAGNOSIS — Z171 Estrogen receptor negative status [ER-]: Secondary | ICD-10-CM

## 2023-04-05 ENCOUNTER — Inpatient Hospital Stay: Payer: BC Managed Care – PPO | Attending: Hematology and Oncology

## 2023-04-05 ENCOUNTER — Encounter: Payer: Self-pay | Admitting: Adult Health

## 2023-04-05 ENCOUNTER — Inpatient Hospital Stay

## 2023-04-05 ENCOUNTER — Inpatient Hospital Stay (HOSPITAL_BASED_OUTPATIENT_CLINIC_OR_DEPARTMENT_OTHER): Payer: BC Managed Care – PPO | Admitting: Adult Health

## 2023-04-05 ENCOUNTER — Inpatient Hospital Stay: Payer: BC Managed Care – PPO

## 2023-04-05 ENCOUNTER — Encounter: Payer: Self-pay | Admitting: Hematology and Oncology

## 2023-04-05 VITALS — BP 133/97 | HR 78 | Temp 98.1°F | Resp 18 | Ht 66.0 in | Wt 276.8 lb

## 2023-04-05 DIAGNOSIS — R232 Flushing: Secondary | ICD-10-CM | POA: Diagnosis not present

## 2023-04-05 DIAGNOSIS — R197 Diarrhea, unspecified: Secondary | ICD-10-CM | POA: Diagnosis not present

## 2023-04-05 DIAGNOSIS — R0981 Nasal congestion: Secondary | ICD-10-CM | POA: Diagnosis not present

## 2023-04-05 DIAGNOSIS — E559 Vitamin D deficiency, unspecified: Secondary | ICD-10-CM | POA: Diagnosis not present

## 2023-04-05 DIAGNOSIS — C50411 Malignant neoplasm of upper-outer quadrant of right female breast: Secondary | ICD-10-CM

## 2023-04-05 DIAGNOSIS — Z87440 Personal history of urinary (tract) infections: Secondary | ICD-10-CM | POA: Diagnosis not present

## 2023-04-05 DIAGNOSIS — Z171 Estrogen receptor negative status [ER-]: Secondary | ICD-10-CM | POA: Diagnosis not present

## 2023-04-05 DIAGNOSIS — Z09 Encounter for follow-up examination after completed treatment for conditions other than malignant neoplasm: Secondary | ICD-10-CM

## 2023-04-05 DIAGNOSIS — R519 Headache, unspecified: Secondary | ICD-10-CM | POA: Diagnosis not present

## 2023-04-05 DIAGNOSIS — Z5189 Encounter for other specified aftercare: Secondary | ICD-10-CM | POA: Diagnosis not present

## 2023-04-05 DIAGNOSIS — Z79899 Other long term (current) drug therapy: Secondary | ICD-10-CM | POA: Diagnosis not present

## 2023-04-05 DIAGNOSIS — Z5111 Encounter for antineoplastic chemotherapy: Secondary | ICD-10-CM | POA: Diagnosis not present

## 2023-04-05 DIAGNOSIS — Z95828 Presence of other vascular implants and grafts: Secondary | ICD-10-CM

## 2023-04-05 DIAGNOSIS — Z9221 Personal history of antineoplastic chemotherapy: Secondary | ICD-10-CM | POA: Diagnosis not present

## 2023-04-05 LAB — CMP (CANCER CENTER ONLY)
ALT: 50 U/L — ABNORMAL HIGH (ref 0–44)
AST: 25 U/L (ref 15–41)
Albumin: 4.2 g/dL (ref 3.5–5.0)
Alkaline Phosphatase: 81 U/L (ref 38–126)
Anion gap: 6 (ref 5–15)
BUN: 14 mg/dL (ref 6–20)
CO2: 26 mmol/L (ref 22–32)
Calcium: 9.7 mg/dL (ref 8.9–10.3)
Chloride: 106 mmol/L (ref 98–111)
Creatinine: 0.78 mg/dL (ref 0.44–1.00)
GFR, Estimated: >60 mL/min (ref >60)
Glucose, Bld: 110 mg/dL — ABNORMAL HIGH (ref 70–99)
Potassium: 3.6 mmol/L (ref 3.5–5.1)
Sodium: 138 mmol/L (ref 135–145)
Total Bilirubin: 0.2 mg/dL (ref 0.0–1.2)
Total Protein: 7.5 g/dL (ref 6.5–8.1)

## 2023-04-05 LAB — CBC WITH DIFFERENTIAL (CANCER CENTER ONLY)
Abs Immature Granulocytes: 0.02 10*3/uL (ref 0.00–0.07)
Basophils Absolute: 0 10*3/uL (ref 0.0–0.1)
Basophils Relative: 0 %
Eosinophils Absolute: 0 10*3/uL (ref 0.0–0.5)
Eosinophils Relative: 0 %
HCT: 31.8 % — ABNORMAL LOW (ref 36.0–46.0)
Hemoglobin: 10.1 g/dL — ABNORMAL LOW (ref 12.0–15.0)
Immature Granulocytes: 0 %
Lymphocytes Relative: 17 %
Lymphs Abs: 0.9 10*3/uL (ref 0.7–4.0)
MCH: 25.1 pg — ABNORMAL LOW (ref 26.0–34.0)
MCHC: 31.8 g/dL (ref 30.0–36.0)
MCV: 78.9 fL — ABNORMAL LOW (ref 80.0–100.0)
Monocytes Absolute: 0.5 10*3/uL (ref 0.1–1.0)
Monocytes Relative: 9 %
Neutro Abs: 3.9 10*3/uL (ref 1.7–7.7)
Neutrophils Relative %: 74 %
Platelet Count: 202 10*3/uL (ref 150–400)
RBC: 4.03 MIL/uL (ref 3.87–5.11)
RDW: 25.1 % — ABNORMAL HIGH (ref 11.5–15.5)
WBC Count: 5.3 10*3/uL (ref 4.0–10.5)
nRBC: 0 % (ref 0.0–0.2)

## 2023-04-05 MED ORDER — DEXAMETHASONE SODIUM PHOSPHATE 10 MG/ML IJ SOLN
10.0000 mg | Freq: Once | INTRAMUSCULAR | Status: AC
  Administered 2023-04-05: 10 mg via INTRAVENOUS
  Filled 2023-04-05: qty 1

## 2023-04-05 MED ORDER — SODIUM CHLORIDE 0.9 % IV SOLN
420.0000 mg | Freq: Once | INTRAVENOUS | Status: AC
  Administered 2023-04-05: 420 mg via INTRAVENOUS
  Filled 2023-04-05: qty 14

## 2023-04-05 MED ORDER — SODIUM CHLORIDE 0.9% FLUSH
10.0000 mL | INTRAVENOUS | Status: DC | PRN
  Administered 2023-04-05: 10 mL

## 2023-04-05 MED ORDER — HEPARIN SOD (PORK) LOCK FLUSH 100 UNIT/ML IV SOLN
500.0000 [IU] | Freq: Once | INTRAVENOUS | Status: AC | PRN
  Administered 2023-04-05: 500 [IU]

## 2023-04-05 MED ORDER — DIPHENHYDRAMINE HCL 25 MG PO CAPS
50.0000 mg | ORAL_CAPSULE | Freq: Once | ORAL | Status: AC
Start: 2023-04-05 — End: 2023-04-05
  Administered 2023-04-05: 50 mg via ORAL
  Filled 2023-04-05: qty 2

## 2023-04-05 MED ORDER — PALONOSETRON HCL INJECTION 0.25 MG/5ML
0.2500 mg | Freq: Once | INTRAVENOUS | Status: AC
  Administered 2023-04-05: 0.25 mg via INTRAVENOUS
  Filled 2023-04-05: qty 5

## 2023-04-05 MED ORDER — ALTEPLASE 2 MG IJ SOLR
2.0000 mg | Freq: Once | INTRAMUSCULAR | Status: AC
Start: 2023-04-05 — End: 2023-04-05
  Administered 2023-04-05: 2 mg
  Filled 2023-04-05: qty 2

## 2023-04-05 MED ORDER — ACETAMINOPHEN 325 MG PO TABS
650.0000 mg | ORAL_TABLET | Freq: Once | ORAL | Status: AC
Start: 2023-04-05 — End: 2023-04-05
  Administered 2023-04-05: 650 mg via ORAL
  Filled 2023-04-05: qty 2

## 2023-04-05 MED ORDER — CARBOPLATIN CHEMO INJECTION 450 MG/45ML
150.0000 mg | Freq: Once | INTRAVENOUS | Status: AC
  Administered 2023-04-05: 150 mg via INTRAVENOUS
  Filled 2023-04-05: qty 15

## 2023-04-05 MED ORDER — TRASTUZUMAB-ANNS CHEMO 150 MG IV SOLR
750.0000 mg | Freq: Once | INTRAVENOUS | Status: AC
  Administered 2023-04-05: 750 mg via INTRAVENOUS
  Filled 2023-04-05: qty 35.71

## 2023-04-05 MED ORDER — SODIUM CHLORIDE 0.9% FLUSH
10.0000 mL | Freq: Once | INTRAVENOUS | Status: AC
  Administered 2023-04-05: 10 mL

## 2023-04-05 MED ORDER — SODIUM CHLORIDE 0.9 % IV SOLN
50.0000 mg/m2 | Freq: Once | INTRAVENOUS | Status: AC
  Administered 2023-04-05: 124 mg via INTRAVENOUS
  Filled 2023-04-05: qty 12.4

## 2023-04-05 MED ORDER — SODIUM CHLORIDE 0.9 % IV SOLN: INTRAVENOUS | Status: DC

## 2023-04-05 NOTE — Progress Notes (Signed)
 Patient states that Taxotere, when ran at goal rate, makes her dizzy and requested that we run it slower today. Spoke with pharmacy, okay to run Taxotere over 1.5 hrs instead of 1 hour. Rate adjusted.

## 2023-04-05 NOTE — Patient Instructions (Signed)
 [facility]  Discharge Instructions: Thank you for choosing [facility] to provide your oncology and hematology care.   If you have a lab appointment with the Cancer Center, please go directly to the Cancer Center and check in at the registration area.   Wear comfortable clothing and clothing appropriate for easy access to any Portacath or PICC line.   We strive to give you quality time with your provider. You may need to reschedule your appointment if you arrive late (15 or more minutes).  Arriving late affects you and other patients whose appointments are after yours.  Also, if you miss three or more appointments without notifying the office, you may be dismissed from the clinic at the provider's discretion.      For prescription refill requests, have your pharmacy contact our office and allow 72 hours for refills to be completed.    Today you received the following chemotherapy and/or immunotherapy agents: Kanjinti, Perjeta, Taxotere, Carboplatin      To help prevent nausea and vomiting after your treatment, we encourage you to take your nausea medication as directed.  BELOW ARE SYMPTOMS THAT SHOULD BE REPORTED IMMEDIATELY: *FEVER GREATER THAN 100.4 F (38 C) OR HIGHER *CHILLS OR SWEATING *NAUSEA AND VOMITING THAT IS NOT CONTROLLED WITH YOUR NAUSEA MEDICATION *UNUSUAL SHORTNESS OF BREATH *UNUSUAL BRUISING OR BLEEDING *URINARY PROBLEMS (pain or burning when urinating, or frequent urination) *BOWEL PROBLEMS (unusual diarrhea, constipation, pain near the anus) TENDERNESS IN MOUTH AND THROAT WITH OR WITHOUT PRESENCE OF ULCERS (sore throat, sores in mouth, or a toothache) UNUSUAL RASH, SWELLING OR PAIN  UNUSUAL VAGINAL DISCHARGE OR ITCHING   Items with * indicate a potential emergency and should be followed up as soon as possible or go to the Emergency Department if any problems should occur.  Please show the  CHEMOTHERAPY ALERT CARD or IMMUNOTHERAPY ALERT CARD at check-in to the Emergency Department and triage nurse.  Should you have questions after your visit or need to cancel or reschedule your appointment, please contact [facility]  Dept: [phone]  and follow the prompts.  Office hours are 8:00 a.m. to 4:30 p.m. Monday - Friday. Please note that voicemails left after 4:00 p.m. may not be returned until the following business day.  We are closed weekends and major holidays. You have access to a nurse at all times for urgent questions. Please call the main number to the clinic Dept: [phone] and follow the prompts.   For any non-urgent questions, you may also contact your provider using MyChart. We now offer e-Visits for anyone 76 and older to request care online for non-urgent symptoms. For details visit [email or web address].   Also download the MyChart app! Go to the app store, search "MyChart", open the app, select [place], and log in with your MyChart username and password.

## 2023-04-05 NOTE — Assessment & Plan Note (Addendum)
 12/18/2022:Palpable right breast lump (irregular density) by ultrasound 1.3 cm at 9:30 position, ultrasound-guided biopsy: Grade 2 IDC no LVI, ER 0%, PR 0%, Ki67 30%, HER2 3+ positive, 1 axillary lymph node with cortical thickening biopsy: Positive    Breast MRI 01/08/2023: 1.6 cm mass, 2.4 cm non-mass enhancement: Biopsy: DCIS   Treatment plan: Neoadjuvant chemotherapy with TCH Perjeta followed by maintenance therapy based on pathologic response Breast conserving surgery (1 versus double lumpectomies) with targeted node dissection Adjuvant radiation therapy ----------------------------------------------------------------------------------------------------------------------------------------------- Current treatment: Cycle 5 TCHP  HER2-positive breast cancer Undergoing treatment with completed neoadjuvant chemotherapy. Hot flashes likely due to treatment.  - Schedule surgery 3-4 weeks after last chemotherapy session. - Perform diagnostic MRI on April 11th. - Plan radiation therapy post-surgery, Monday through Friday for 4-6 weeks.  Port catheter issues Experienced issues with blood return from port catheter, requiring cath flow treatments. Fourth occurrence. Dye study discussed with patient who wants to proceed with test. - Order dye study  Vitamin D deficiency Plans to resume vitamin D supplementation post-chemotherapy due to common deficiency. - Resume vitamin D supplementation post-chemotherapy.  Post-surgical travel considerations Considering travel to [facility] post-surgery. Advised to avoid international travel and take precautions against blood clots. Travel plans to be discussed with Dr. Luisa Hart for safety post-surgery. - Advise on precautions against blood clots during travel post-surgery. - Consult with Dr. Luisa Hart regarding travel plans post-surgery.  RTC every 3 weeks for treatment.

## 2023-04-05 NOTE — Progress Notes (Signed)
 Per Dr Pamelia Hoit, keep chemo doses same as last cycle. OK to adj Trastuzumab using most recent weight per Dr. Pamelia Hoit.  Ebony Hail, Pharm.D., CPP 04/05/2023@11 :48 AM

## 2023-04-05 NOTE — Progress Notes (Unsigned)
 [facility] Cancer Follow up:    Kara Able, MD [address] [place] [address]   DIAGNOSIS:  Cancer Staging  Malignant neoplasm of upper-outer quadrant of right breast in female, estrogen receptor negative (HCC) Staging form: Breast, AJCC 8th Edition - Clinical: Stage IIA (cT1c, cN1, cM0, G2, ER-, PR-, HER2+) - Signed by Serena Croissant, MD on 12/27/2022 Stage prefix: Initial diagnosis Histologic grading system: 3 grade system   SUMMARY OF ONCOLOGIC HISTORY: Oncology History  Malignant neoplasm of upper-outer quadrant of right breast in female, estrogen receptor negative (HCC)  12/18/2022 Initial Diagnosis   Palpable right breast lump (irregular density) by ultrasound 1.3 cm at 9:30 position, ultrasound-guided biopsy: Grade 2 IDC no LVI, ER 0%, PR 0%, Ki67 30%, HER2 3+ positive, 1 axillary lymph node with cortical thickening biopsy: Positive   12/27/2022 Cancer Staging   Staging form: Breast, AJCC 8th Edition - Clinical: Stage IIA (cT1c, cN1, cM0, G2, ER-, PR-, HER2+) - Signed by Serena Croissant, MD on 12/27/2022 Stage prefix: Initial diagnosis Histologic grading system: 3 grade system   01/11/2023 Genetic Testing   Negative [facility] CancerNext +RNAinsight Panel.  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 Report date is 01/11/2023.   The [employer] CancerNext+RNAinsight Panel includes sequencing, rearrangement analysis, and RNA analysis for the following 39 genes: APC, ATM, BAP1, BARD1, BMPR1A, BRCA1, BRCA2, BRIP1, CDH1, CDKN2A, CHEK2, FH, FLCN, MET, MLH1, MSH2, MSH6, MUTYH, NF1, NTHL1, PALB2, PMS2, PTEN, RAD51C, RAD51D, SMAD4, STK11, TP53, TSC1, TSC2, and VHL (sequencing and deletion/duplication); AXIN2, HOXB13, MBD4, MSH3, POLD1 and POLE (sequencing only); EPCAM and GREM1 (deletion/duplication only).    01/12/2023 -  Chemotherapy   Patient is on Treatment Plan : BREAST  Docetaxel + Carboplatin + Trastuzumab + Pertuzumab  (TCHP) q21d        CURRENT THERAPY: TCHP  #5  INTERVAL HISTORY:  Discussed the use of AI scribe software for clinical note transcription with the patient, who gave verbal consent to proceed.  Kara Webb 43 y.o. female a patient with HER2 positive breast cancer, is currently undergoing chemotherapy. She reports issues with blood return from her port-a-cath during her treatments, which has required multiple attempts and use of cath flow to resolve.   Second, she has been experiencing hot flashes and night sweats, particularly on her scalp, which she attributes to hormonal changes from the treatment. She has not reported any bowel or bladder issues. She is scheduled for surgery approximately 30 days after her last chemotherapy treatment and is also due for an MRI scan. She has expressed a desire to travel after her surgery and is seeking advice on this matter.   Patient Active Problem List   Diagnosis Date Noted  . Port-A-Cath in place 01/12/2023  . Genetic testing 01/12/2023  . Family history of breast cancer 12/27/2022  . Family history of colon cancer 12/27/2022  . Malignant neoplasm of upper-outer quadrant of right breast in female, estrogen receptor negative (HCC) 12/25/2022    is allergic to [facility] dimeglumine].  MEDICAL HISTORY: Past Medical History:  Diagnosis Date  . Breast cancer (HCC)   . BV (bacterial vaginosis)   . Cold intolerance 01/16/2005  . Fatigue 01/16/2005  . H/O chlamydia infection 08/12/2010  . H/O urinary frequency 11/16/2000  . H/O vaginal discharge 11/17/2003  . H/O varicella   . Heart murmur    Born with murmur  . HSV-1 infection   . Hx: UTI (urinary tract infection)   . Pelvic pain   . Vulvovaginal candidiasis  04/16/2005  . Yeast infection     SURGICAL HISTORY: Past Surgical History:  Procedure Laterality Date  . BREAST BIOPSY Right 12/18/2022   [place] RT BREAST BX W LOC DEV 1ST LESION IMG BX SPEC US GUIDE 12/18/2022 [facility] MAMMOGRAPHY  . PORTACATH PLACEMENT N/A 01/03/2023    Procedure: INSERTION PORT-A-CATH WITH ULTRASOUND Julian Reil;  Surgeon: Harriette Bouillon, MD;  Location: [facility];  Service: General;  Laterality: N/A;  . TOE SURGERY  2004  . WISDOM TOOTH EXTRACTION  2003    SOCIAL HISTORY: Social History   Socioeconomic History  . Marital status: Single    Spouse name: Not on file  . Number of children: Not on file  . Years of education: Not on file  . Highest education level: Not on file  Occupational History  . Not on file  Tobacco Use  . Smoking status: Never  . Smokeless tobacco: Never  Vaping Use  . Vaping status: Former  Substance and Sexual Activity  . Alcohol use: No  . Drug use: No  . Sexual activity: Yes    Birth control/protection: None  Other Topics Concern  . Not on file  Social History Narrative  . Not on file   Social Drivers of Health   Financial Resource Strain: Not on file  Food Insecurity: No Food Insecurity (12/27/2022)   Hunger Vital Sign   . Worried About Programme researcher, broadcasting/film/video in the Last Year: Never true   . Ran Out of Food in the Last Year: Never true  Transportation Needs: No Transportation Needs (12/27/2022)   PRAPARE - Transportation   . Lack of Transportation (Medical): No   . Lack of Transportation (Non-Medical): No  Physical Activity: Not on file  Stress: Not on file  Social Connections: Not on file  Intimate Partner Violence: Not At Risk (12/27/2022)   Humiliation, Afraid, Rape, and Kick questionnaire   . Fear of Current or Ex-Partner: No   . Emotionally Abused: No   . Physically Abused: No   . Sexually Abused: No    FAMILY HISTORY: Family History  Problem Relation Age of Onset  . Breast cancer Maternal Aunt 38  . Colon cancer Maternal Aunt 50  . Colon cancer Maternal Uncle 65  . Hypertension Maternal Grandmother   . Diabetes Maternal Grandmother   . Hypertension Maternal Grandfather   . Breast cancer Paternal Grandmother        dx mid 23s-70s    Review of Systems   Constitutional:  Negative for appetite change, chills, fatigue, fever and unexpected weight change.  HENT:   Negative for hearing loss, lump/mass and trouble swallowing.   Eyes:  Negative for eye problems and icterus.  Respiratory:  Negative for chest tightness, cough and shortness of breath.   Cardiovascular:  Negative for chest pain, leg swelling and palpitations.  Gastrointestinal:  Negative for abdominal distention, abdominal pain, constipation, diarrhea, nausea and vomiting.  Endocrine: Positive for hot flashes.  Genitourinary:  Negative for difficulty urinating.   Musculoskeletal:  Negative for arthralgias.  Skin:  Negative for itching and rash.  Neurological:  Negative for dizziness, extremity weakness, headaches and numbness.  Hematological:  Negative for adenopathy. Does not bruise/bleed easily.  Psychiatric/Behavioral:  Negative for depression. The patient is not nervous/anxious.       PHYSICAL EXAMINATION    Vitals:   04/05/23 0941  BP: (!) 133/97  Pulse: 78  Resp: 18  Temp: 98.1 F (36.7 C)  SpO2: 100%    Physical Exam  Constitutional:      General: She is not in acute distress.    Appearance: Normal appearance. She is not toxic-appearing.  HENT:     Head: Normocephalic and atraumatic.     Mouth/Throat:     Mouth: Mucous membranes are moist.     Pharynx: Oropharynx is clear. No oropharyngeal exudate or posterior oropharyngeal erythema.  Eyes:     General: No scleral icterus. Cardiovascular:     Rate and Rhythm: Normal rate and regular rhythm.     Pulses: Normal pulses.     Heart sounds: Normal heart sounds.  Pulmonary:     Effort: Pulmonary effort is normal.     Breath sounds: Normal breath sounds.  Abdominal:     General: Abdomen is flat. Bowel sounds are normal. There is no distension.     Palpations: Abdomen is soft.     Tenderness: There is no abdominal tenderness.  Musculoskeletal:        General: No swelling.     Cervical back: Neck supple.   Lymphadenopathy:     Cervical: No cervical adenopathy.  Skin:    General: Skin is warm and dry.     Findings: No rash.  Neurological:     General: No focal deficit present.     Mental Status: She is alert.  Psychiatric:        Mood and Affect: Mood normal.        Behavior: Behavior normal.    LABORATORY DATA:  CBC    Component Value Date/Time   WBC 5.3 04/05/2023 0742   WBC 3.2 (L) 01/15/2023 0732   RBC 4.03 04/05/2023 0742   HGB 10.1 (L) 04/05/2023 0742   HCT 31.8 (L) 04/05/2023 0742   PLT 202 04/05/2023 0742   MCV 78.9 (L) 04/05/2023 0742   MCH 25.1 (L) 04/05/2023 0742   MCHC 31.8 04/05/2023 0742   RDW 25.1 (H) 04/05/2023 0742   LYMPHSABS 0.9 04/05/2023 0742   MONOABS 0.5 04/05/2023 0742   EOSABS 0.0 04/05/2023 0742   BASOSABS 0.0 04/05/2023 0742    CMP     Component Value Date/Time   NA 138 04/05/2023 0742   K 3.6 04/05/2023 0742   CL 106 04/05/2023 0742   CO2 26 04/05/2023 0742   GLUCOSE 110 (H) 04/05/2023 0742   BUN 14 04/05/2023 0742   CREATININE 0.78 04/05/2023 0742   CALCIUM 9.7 04/05/2023 0742   PROT 7.5 04/05/2023 0742   ALBUMIN 4.2 04/05/2023 0742   AST 25 04/05/2023 0742   ALT 50 (H) 04/05/2023 0742   ALKPHOS 81 04/05/2023 0742   BILITOT 0.2 04/05/2023 0742   GFRNONAA >60 04/05/2023 0742   GFRAA >60 09/25/2018 0052         ASSESSMENT and THERAPY PLAN:   Malignant neoplasm of upper-outer quadrant of right breast in female, estrogen receptor negative (HCC) 12/18/2022:Palpable right breast lump (irregular density) by ultrasound 1.3 cm at 9:30 position, ultrasound-guided biopsy: Grade 2 IDC no LVI, ER 0%, PR 0%, Ki67 30%, HER2 3+ positive, 1 axillary lymph node with cortical thickening biopsy: Positive    Breast MRI 01/08/2023: 1.6 cm mass, 2.4 cm non-mass enhancement: Biopsy: DCIS   Treatment plan: Neoadjuvant chemotherapy with TCH Perjeta followed by maintenance therapy based on pathologic response Breast conserving surgery (1 versus  double lumpectomies) with targeted node dissection Adjuvant radiation therapy ----------------------------------------------------------------------------------------------------------------------------------------------- Current treatment: Cycle 5 TCHP Chemo toxicities:   All questions were answered. The patient knows to call the clinic with any problems, questions  or concerns. We can certainly see the patient much sooner if necessary.  Total encounter time:*** minutes*in face-to-face visit time, chart review, lab review, care coordination, order entry, and documentation of the encounter time.    Lillard Anes, NP 04/09/23 9:15 AM Medical Oncology and Hematology [facility] [address] [place], [place] [address] Tel. [phone]    Fax. [phone]  *Total Encounter Time as defined by the Centers for Medicare and Medicaid Services includes, in addition to the face-to-face time of a patient visit (documented in the note above) non-face-to-face time: obtaining and reviewing outside history, ordering and reviewing medications, tests or procedures, care coordination (communications with other health care professionals or caregivers) and documentation in the medical record.

## 2023-04-06 ENCOUNTER — Encounter: Payer: Self-pay | Admitting: *Deleted

## 2023-04-06 ENCOUNTER — Telehealth: Payer: Self-pay | Admitting: Adult Health

## 2023-04-06 NOTE — Telephone Encounter (Signed)
 Scheduled appointments per 3/20 los. Left VM with appointment details.

## 2023-04-07 ENCOUNTER — Other Ambulatory Visit: Payer: Self-pay

## 2023-04-07 ENCOUNTER — Inpatient Hospital Stay: Payer: BC Managed Care – PPO

## 2023-04-07 VITALS — BP 131/88 | HR 89 | Temp 97.6°F | Resp 17

## 2023-04-07 DIAGNOSIS — Z9221 Personal history of antineoplastic chemotherapy: Secondary | ICD-10-CM | POA: Diagnosis not present

## 2023-04-07 DIAGNOSIS — Z5111 Encounter for antineoplastic chemotherapy: Secondary | ICD-10-CM | POA: Diagnosis not present

## 2023-04-07 DIAGNOSIS — C50411 Malignant neoplasm of upper-outer quadrant of right female breast: Secondary | ICD-10-CM | POA: Diagnosis not present

## 2023-04-07 DIAGNOSIS — E559 Vitamin D deficiency, unspecified: Secondary | ICD-10-CM | POA: Diagnosis not present

## 2023-04-07 DIAGNOSIS — R519 Headache, unspecified: Secondary | ICD-10-CM | POA: Diagnosis not present

## 2023-04-07 DIAGNOSIS — Z79899 Other long term (current) drug therapy: Secondary | ICD-10-CM | POA: Diagnosis not present

## 2023-04-07 DIAGNOSIS — Z5189 Encounter for other specified aftercare: Secondary | ICD-10-CM | POA: Diagnosis not present

## 2023-04-07 DIAGNOSIS — Z87440 Personal history of urinary (tract) infections: Secondary | ICD-10-CM | POA: Diagnosis not present

## 2023-04-07 DIAGNOSIS — R0981 Nasal congestion: Secondary | ICD-10-CM | POA: Diagnosis not present

## 2023-04-07 DIAGNOSIS — Z171 Estrogen receptor negative status [ER-]: Secondary | ICD-10-CM | POA: Diagnosis not present

## 2023-04-07 DIAGNOSIS — R232 Flushing: Secondary | ICD-10-CM | POA: Diagnosis not present

## 2023-04-07 DIAGNOSIS — R197 Diarrhea, unspecified: Secondary | ICD-10-CM | POA: Diagnosis not present

## 2023-04-07 MED ORDER — PEGFILGRASTIM-JMDB 6 MG/0.6ML ~~LOC~~ SOSY
6.0000 mg | PREFILLED_SYRINGE | Freq: Once | SUBCUTANEOUS | Status: AC
  Administered 2023-04-07: 6 mg via SUBCUTANEOUS
  Filled 2023-04-07: qty 0.6

## 2023-04-07 NOTE — Patient Instructions (Signed)

## 2023-04-10 ENCOUNTER — Encounter: Payer: Self-pay | Admitting: Nurse Practitioner

## 2023-04-10 ENCOUNTER — Encounter: Payer: Self-pay | Admitting: *Deleted

## 2023-04-11 ENCOUNTER — Encounter: Payer: Self-pay | Admitting: Hematology and Oncology

## 2023-04-13 ENCOUNTER — Encounter: Payer: Self-pay | Admitting: *Deleted

## 2023-04-13 ENCOUNTER — Telehealth: Payer: Self-pay | Admitting: *Deleted

## 2023-04-13 ENCOUNTER — Ambulatory Visit (HOSPITAL_COMMUNITY)
Admission: RE | Admit: 2023-04-13 | Discharge: 2023-04-13 | Disposition: A | Discharge status: Home / Self Care | Source: Ambulatory Visit | Attending: Adult Health | Admitting: Adult Health

## 2023-04-13 DIAGNOSIS — T82598A Other mechanical complication of other cardiac and vascular devices and implants, initial encounter: Secondary | ICD-10-CM | POA: Diagnosis not present

## 2023-04-13 DIAGNOSIS — Z171 Estrogen receptor negative status [ER-]: Secondary | ICD-10-CM | POA: Diagnosis not present

## 2023-04-13 DIAGNOSIS — Z09 Encounter for follow-up examination after completed treatment for conditions other than malignant neoplasm: Secondary | ICD-10-CM

## 2023-04-13 DIAGNOSIS — C50411 Malignant neoplasm of upper-outer quadrant of right female breast: Secondary | ICD-10-CM | POA: Insufficient documentation

## 2023-04-13 HISTORY — PX: IR CV LINE INJECTION: IMG2294

## 2023-04-13 MED ORDER — HEPARIN SOD (PORK) LOCK FLUSH 100 UNIT/ML IV SOLN
INTRAVENOUS | Status: AC
  Filled 2023-04-13: qty 5

## 2023-04-13 MED ORDER — IOHEXOL 300 MG/ML  SOLN
50.0000 mL | Freq: Once | INTRAMUSCULAR | Status: AC | PRN
Start: 2023-04-13 — End: 2023-04-13
  Administered 2023-04-13: 10 mL

## 2023-04-13 MED ORDER — HEPARIN SOD (PORK) LOCK FLUSH 100 UNIT/ML IV SOLN
500.0000 [IU] | Freq: Once | INTRAVENOUS | Status: AC
  Administered 2023-04-13: 500 [IU] via INTRAVENOUS

## 2023-04-13 NOTE — Telephone Encounter (Signed)
 Per NP pt has scored high on GAD-7 questioner and states pt should be provided information  for [facility] behavioral health urgent care for evaluation and management if she is experiencing anxiety crises or needs immediate help.  Pt states she is okay at this time and will wait for visit with NP next week.

## 2023-04-13 NOTE — Progress Notes (Signed)
 Received call from pt with complaint of ongoing anxiety and trouble sleeping with no relief from Xanax.  RN reviewed with NP who states pt needs GAD-7 anxiety questioner completed and f/u appt.  RN reviewed questioner and pt total core 16.  Questioner given to NP to review during visit next week.

## 2023-04-14 ENCOUNTER — Inpatient Hospital Stay

## 2023-04-14 DIAGNOSIS — R0981 Nasal congestion: Secondary | ICD-10-CM | POA: Diagnosis not present

## 2023-04-14 DIAGNOSIS — Z09 Encounter for follow-up examination after completed treatment for conditions other than malignant neoplasm: Secondary | ICD-10-CM

## 2023-04-14 DIAGNOSIS — Z171 Estrogen receptor negative status [ER-]: Secondary | ICD-10-CM | POA: Diagnosis not present

## 2023-04-14 DIAGNOSIS — R232 Flushing: Secondary | ICD-10-CM | POA: Diagnosis not present

## 2023-04-14 DIAGNOSIS — Z87440 Personal history of urinary (tract) infections: Secondary | ICD-10-CM | POA: Diagnosis not present

## 2023-04-14 DIAGNOSIS — E559 Vitamin D deficiency, unspecified: Secondary | ICD-10-CM | POA: Diagnosis not present

## 2023-04-14 DIAGNOSIS — R197 Diarrhea, unspecified: Secondary | ICD-10-CM | POA: Diagnosis not present

## 2023-04-14 DIAGNOSIS — Z79899 Other long term (current) drug therapy: Secondary | ICD-10-CM | POA: Diagnosis not present

## 2023-04-14 DIAGNOSIS — Z5111 Encounter for antineoplastic chemotherapy: Secondary | ICD-10-CM | POA: Diagnosis not present

## 2023-04-14 DIAGNOSIS — R519 Headache, unspecified: Secondary | ICD-10-CM | POA: Diagnosis not present

## 2023-04-14 DIAGNOSIS — C50411 Malignant neoplasm of upper-outer quadrant of right female breast: Secondary | ICD-10-CM | POA: Diagnosis not present

## 2023-04-14 DIAGNOSIS — Z9221 Personal history of antineoplastic chemotherapy: Secondary | ICD-10-CM | POA: Diagnosis not present

## 2023-04-14 DIAGNOSIS — Z5189 Encounter for other specified aftercare: Secondary | ICD-10-CM | POA: Diagnosis not present

## 2023-04-14 MED ORDER — SODIUM CHLORIDE 0.9 % IV SOLN: Freq: Once | INTRAVENOUS | Status: AC

## 2023-04-17 ENCOUNTER — Encounter: Payer: Self-pay | Admitting: Licensed Clinical Social Worker

## 2023-04-17 ENCOUNTER — Inpatient Hospital Stay: Attending: Hematology and Oncology | Admitting: Adult Health

## 2023-04-17 DIAGNOSIS — Z7952 Long term (current) use of systemic steroids: Secondary | ICD-10-CM | POA: Insufficient documentation

## 2023-04-17 DIAGNOSIS — C50411 Malignant neoplasm of upper-outer quadrant of right female breast: Secondary | ICD-10-CM | POA: Diagnosis not present

## 2023-04-17 DIAGNOSIS — Z171 Estrogen receptor negative status [ER-]: Secondary | ICD-10-CM | POA: Insufficient documentation

## 2023-04-17 DIAGNOSIS — D509 Iron deficiency anemia, unspecified: Secondary | ICD-10-CM | POA: Insufficient documentation

## 2023-04-17 DIAGNOSIS — Z800 Family history of malignant neoplasm of digestive organs: Secondary | ICD-10-CM | POA: Insufficient documentation

## 2023-04-17 DIAGNOSIS — G4700 Insomnia, unspecified: Secondary | ICD-10-CM | POA: Insufficient documentation

## 2023-04-17 DIAGNOSIS — R079 Chest pain, unspecified: Secondary | ICD-10-CM | POA: Insufficient documentation

## 2023-04-17 DIAGNOSIS — Z87891 Personal history of nicotine dependence: Secondary | ICD-10-CM | POA: Insufficient documentation

## 2023-04-17 DIAGNOSIS — Z803 Family history of malignant neoplasm of breast: Secondary | ICD-10-CM | POA: Insufficient documentation

## 2023-04-17 DIAGNOSIS — R232 Flushing: Secondary | ICD-10-CM | POA: Insufficient documentation

## 2023-04-17 DIAGNOSIS — Z87440 Personal history of urinary (tract) infections: Secondary | ICD-10-CM | POA: Insufficient documentation

## 2023-04-17 DIAGNOSIS — Z79899 Other long term (current) drug therapy: Secondary | ICD-10-CM | POA: Insufficient documentation

## 2023-04-17 DIAGNOSIS — Z5189 Encounter for other specified aftercare: Secondary | ICD-10-CM | POA: Insufficient documentation

## 2023-04-17 DIAGNOSIS — D709 Neutropenia, unspecified: Secondary | ICD-10-CM | POA: Insufficient documentation

## 2023-04-17 DIAGNOSIS — Z5111 Encounter for antineoplastic chemotherapy: Secondary | ICD-10-CM | POA: Insufficient documentation

## 2023-04-17 MED ORDER — VENLAFAXINE HCL ER 37.5 MG PO CP24: 37.5000 mg | ORAL_CAPSULE | Freq: Every day | ORAL | 1 refills | Status: DC

## 2023-04-17 NOTE — Patient Instructions (Signed)
Venlafaxine Extended-Release Capsules What is this medication? VENLAFAXINE (VEN la fax een) treats depression and anxiety. It increases the amount of serotonin and norepinephrine in the brain, hormones that help regulate mood. It belongs to a group of medications called SNRIs. This medicine may be used for other purposes; ask your health care provider or pharmacist if you have questions. COMMON BRAND NAME(S): Effexor XR What should I tell my care team before I take this medication? They need to know if you have any of these conditions: Bleeding disorders Glaucoma Heart disease High blood pressure High cholesterol Kidney disease Liver disease Low levels of sodium in the blood Mania or bipolar disorder Seizures Suicidal thoughts, plans, or attempt by you or a family member Take medications that treat or prevent blood clots Thyroid disease An unusual or allergic reaction to venlafaxine, other medications, foods, dyes, or preservatives Pregnant or trying to get pregnant Breastfeeding How should I use this medication? Take this medication by mouth with a full glass of water. Take it as directed on the prescription label. Do not cut, crush, or chew this medication. Take it with food. You may open the capsule and put the contents in 1 teaspoon of applesauce. Swallow the medication and applesauce right away. Do not chew the medication or applesauce. Follow with a glass of water to ensure complete swallowing of the pellets. Try to take your medication at about the same time each day. Do not take your medication more often than directed. Keep taking this medication unless your care team tells you to stop. Stopping it too quickly can cause serious side effects. It can also make your condition worse. A special MedGuide will be given to you by the pharmacist with each prescription and refill. Be sure to read this information carefully each time. Talk to your care team about the use of this medication in  children. Special care may be needed. Overdosage: If you think you have taken too much of this medicine contact a poison control center or emergency room at once. NOTE: This medicine is only for you. Do not share this medicine with others. What if I miss a dose? If you miss a dose, take it as soon as you can. If it is almost time for your next dose, take only that dose. Do not take double or extra doses. What may interact with this medication? Do not take this medication with any of the following: Alcohol Certain medications for fungal infections, such as fluconazole, itraconazole, ketoconazole, posaconazole, voriconazole Cisapride Desvenlafaxine Dronedarone Duloxetine Levomilnacipran Linezolid MAOIs, such as Carbex, Eldepryl, Marplan, Nardil, and Parnate Methylene blue (injected into a vein) Milnacipran Pimozide Thioridazine This medication may also interact with the following: Amphetamines Aspirin and aspirin-like medications Certain medications for mental health conditions Certain medications for migraine headaches, such as almotriptan, eletriptan, frovatriptan, naratriptan, rizatriptan, sumatriptan, zolmitriptan Certain medications for sleep Certain medications that treat or prevent blood clots, such as dalteparin, enoxaparin, warfarin Cimetidine Clozapine Diuretics Fentanyl Furazolidone Indinavir Isoniazid Lithium Metoprolol NSAIDS, medications for pain and inflammation, such as ibuprofen or naproxen Other medications that cause heart rhythm changes Procarbazine Rasagiline Supplements, such as St. John's wort, kava kava, valerian Tramadol Tryptophan This list may not describe all possible interactions. Give your health care provider a list of all the medicines, herbs, non-prescription drugs, or dietary supplements you use. Also tell them if you smoke, drink alcohol, or use illegal drugs. Some items may interact with your medicine. What should I watch for while using  this medication? Tell   your care team if your symptoms do not get better or if they get worse. Visit your care team for regular checks on your progress. Because it may take several weeks to see the full effects of this medication, it is important to continue your treatment as prescribed by your care team. Watch for new or worsening thoughts of suicide or depression. This includes sudden changes in mood, behaviors, or thoughts. These changes can happen at any time but are more common in the beginning of treatment or after a change in dose. Call your care team right away if you experience these thoughts or worsening depression. This medication may cause mood and behavior changes, such as anxiety, nervousness, irritability, hostility, restlessness, excitability, hyperactivity, or trouble sleeping. These changes can happen at any time but are more common in the beginning of treatment or after a change in dose. Call your care team right away if you notice any of these symptoms. This medication can cause an increase in blood pressure. Check with your care team for instructions on monitoring your blood pressure while taking this medication. This medication may affect your coordination, reaction time, or judgment. Do not drive or operate machinery until you know how this medication affects you. Sit up or stand slowly to reduce the risk of dizzy or fainting spells. Drinking alcohol with this medication can increase the risk of these side effects. Your mouth may get dry. Chewing sugarless gum or sucking hard candy and drinking plenty of water may help. Contact your care team if the problem does not go away or is severe. What side effects may I notice from receiving this medication? Side effects that you should report to your care team as soon as possible: Allergic reactions--skin rash, itching, hives, swelling of the face, lips, tongue, or throat Bleeding--bloody or black, tar-like stools, red or dark brown urine,  vomiting blood or brown material that looks like coffee grounds, small, red or purple spots on skin, unusual bleeding or bruising Heart rhythm changes--fast or irregular heartbeat, dizziness, feeling faint or lightheaded, chest pain, trouble breathing Increase in blood pressure Loss of appetite with weight loss Low sodium level--muscle weakness, fatigue, dizziness, headache, confusion Serotonin syndrome--irritability, confusion, fast or irregular heartbeat, muscle stiffness, twitching muscles, sweating, high fever, seizures, chills, vomiting, diarrhea Sudden eye pain or change in vision such as blurry vision, seeing halos around lights, vision loss Thoughts of suicide or self-harm, worsening mood, feelings of depression Side effects that usually do not require medical attention (report to your care team if they continue or are bothersome): Anxiety, nervousness Change in sex drive or performance Dizziness Dry mouth Excessive sweating Nausea Tremors or shaking Trouble sleeping This list may not describe all possible side effects. Call your doctor for medical advice about side effects. You may report side effects to FDA at 1-800-FDA-1088. Where should I keep my medication? Keep out of the reach of children and pets. Store at a controlled temperature between 20 and 25 degrees C (68 degrees and 77 degrees F), in a dry place. Throw away any unused medication after the expiration date. NOTE: This sheet is a summary. It may not cover all possible information. If you have questions about this medicine, talk to your doctor, pharmacist, or health care provider.  2024 Elsevier/Gold Standard (2021-12-29 00:00:00)  

## 2023-04-17 NOTE — Progress Notes (Signed)
 [facility] Cancer Follow up:    Kara Able, MD [address] [place] [address]   DIAGNOSIS:  Cancer Staging  Malignant neoplasm of upper-outer quadrant of right breast in female, estrogen receptor negative (HCC) Staging form: Breast, AJCC 8th Edition - Clinical: Stage IIA (cT1c, cN1, cM0, G2, ER-, PR-, HER2+) - Signed by Serena Croissant, MD on 12/27/2022 Stage prefix: Initial diagnosis Histologic grading system: 3 grade system   SUMMARY OF ONCOLOGIC HISTORY: Oncology History  Malignant neoplasm of upper-outer quadrant of right breast in female, estrogen receptor negative (HCC)  12/18/2022 Initial Diagnosis   Palpable right breast lump (irregular density) by ultrasound 1.3 cm at 9:30 position, ultrasound-guided biopsy: Grade 2 IDC no LVI, ER 0%, PR 0%, Ki67 30%, HER2 3+ positive, 1 axillary lymph node with cortical thickening biopsy: Positive   12/27/2022 Cancer Staging   Staging form: Breast, AJCC 8th Edition - Clinical: Stage IIA (cT1c, cN1, cM0, G2, ER-, PR-, HER2+) - Signed by Serena Croissant, MD on 12/27/2022 Stage prefix: Initial diagnosis Histologic grading system: 3 grade system   01/11/2023 Genetic Testing   Negative [facility] CancerNext +RNAinsight Panel.  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 Report date is 01/11/2023.   The [facility] CancerNext+RNAinsight Panel includes sequencing, rearrangement analysis, and RNA analysis for the following 39 genes: APC, ATM, BAP1, BARD1, BMPR1A, BRCA1, BRCA2, BRIP1, CDH1, CDKN2A, CHEK2, FH, FLCN, MET, MLH1, MSH2, MSH6, MUTYH, NF1, NTHL1, PALB2, PMS2, PTEN, RAD51C, RAD51D, SMAD4, STK11, TP53, TSC1, TSC2, and VHL (sequencing and deletion/duplication); AXIN2, HOXB13, MBD4, MSH3, POLD1 and POLE (sequencing only); EPCAM and GREM1 (deletion/duplication only).    01/12/2023 -  Chemotherapy   Patient is on Treatment Plan : BREAST  Docetaxel + Carboplatin + Trastuzumab + Pertuzumab  (TCHP) q21d        CURRENT THERAPY:  TCHP  INTERVAL HISTORY:  Discussed the use of AI scribe software for clinical note transcription with the patient, who gave verbal consent to proceed.  Kara Webb 43 y.o. female  with a history of cancer currently undergoing chemotherapy, presents with issues of insomnia, anxiety, and hot flashes. She reports difficulty falling asleep and staying asleep, often waking up in hour increments. She also describes a heightened sense of anxiety, particularly related to the impending end of her chemotherapy treatment and the subsequent phase of her cancer journey. The patient's anxiety is further exacerbated by an upcoming interventional radiology procedure to address a fibrin sheath issue with her port.  In addition to her sleep and anxiety issues, the patient is experiencing hot flashes, specifically localized to her head. These hot flashes are severe enough to cause night sweats, leading to wet pillows. The patient's anxiety level, as measured by the GAD-7 scale, was reported to be high at 16.  The patient is currently on Xanax, which she reports has helped her sleep better.   Patient Active Problem List   Diagnosis Date Noted   Port-A-Cath in place 01/12/2023   Genetic testing 01/12/2023   Family history of breast cancer 12/27/2022   Family history of colon cancer 12/27/2022   Malignant neoplasm of upper-outer quadrant of right breast in female, estrogen receptor negative (HCC) 12/25/2022    is allergic to [facility] dimeglumine].  MEDICAL HISTORY: Past Medical History:  Diagnosis Date   Breast cancer (HCC)    BV (bacterial vaginosis)    Cold intolerance 01/16/2005   Fatigue 01/16/2005   H/O chlamydia infection 08/12/2010   H/O urinary frequency 11/16/2000   H/O vaginal discharge 11/17/2003  H/O varicella    Heart murmur    Born with murmur   HSV-1 infection    Hx: UTI (urinary tract infection)    Pelvic pain    Vulvovaginal candidiasis 04/16/2005   Yeast infection      SURGICAL HISTORY: Past Surgical History:  Procedure Laterality Date   BREAST BIOPSY Right 12/18/2022   [place] RT BREAST BX W LOC DEV 1ST LESION IMG BX SPEC US GUIDE 12/18/2022 [facility] MAMMOGRAPHY   IR CV LINE INJECTION  04/13/2023   PORTACATH PLACEMENT N/A 01/03/2023   Procedure: INSERTION PORT-A-CATH WITH ULTRASOUND Julian Reil;  Surgeon: Harriette Bouillon, MD;  Location: [facility];  Service: General;  Laterality: N/A;   TOE SURGERY  2004   WISDOM TOOTH EXTRACTION  2003    SOCIAL HISTORY: Social History   Socioeconomic History   Marital status: Single    Spouse name: Not on file   Number of children: Not on file   Years of education: Not on file   Highest education level: Not on file  Occupational History   Not on file  Tobacco Use   Smoking status: Never   Smokeless tobacco: Never  Vaping Use   Vaping status: Former  Substance and Sexual Activity   Alcohol use: No   Drug use: No   Sexual activity: Yes    Birth control/protection: None  Other Topics Concern   Not on file  Social History Narrative   Not on file   Social Drivers of Health   Financial Resource Strain: Not on file  Food Insecurity: No Food Insecurity (12/27/2022)   Hunger Vital Sign    Worried About Running Out of Food in the Last Year: Never true    Ran Out of Food in the Last Year: Never true  Transportation Needs: No Transportation Needs (12/27/2022)   PRAPARE - Administrator, Civil Service (Medical): No    Lack of Transportation (Non-Medical): No  Physical Activity: Not on file  Stress: Not on file  Social Connections: Not on file  Intimate Partner Violence: Not At Risk (12/27/2022)   Humiliation, Afraid, Rape, and Kick questionnaire    Fear of Current or Ex-Partner: No    Emotionally Abused: No    Physically Abused: No    Sexually Abused: No    FAMILY HISTORY: Family History  Problem Relation Age of Onset   Breast cancer Maternal Aunt 67   Colon cancer Maternal  Aunt 50   Colon cancer Maternal Uncle 65   Hypertension Maternal Grandmother    Diabetes Maternal Grandmother    Hypertension Maternal Grandfather    Breast cancer Paternal Grandmother        dx mid 55s-70s    Review of Systems  Constitutional:  Negative for appetite change, chills, fatigue, fever and unexpected weight change.  HENT:   Negative for hearing loss, lump/mass and trouble swallowing.   Eyes:  Negative for eye problems and icterus.  Respiratory:  Negative for chest tightness, cough and shortness of breath.   Cardiovascular:  Negative for chest pain, leg swelling and palpitations.  Gastrointestinal:  Negative for abdominal distention, abdominal pain, constipation, diarrhea, nausea and vomiting.  Endocrine: Negative for hot flashes.  Genitourinary:  Negative for difficulty urinating.   Musculoskeletal:  Negative for arthralgias.  Skin:  Negative for itching and rash.  Neurological:  Negative for dizziness, extremity weakness, headaches and numbness.  Hematological:  Negative for adenopathy. Does not bruise/bleed easily.  Psychiatric/Behavioral:  Positive for sleep disturbance. Negative  for depression. The patient is nervous/anxious.       PHYSICAL EXAMINATION  Patient sounds well.  She is in no apparent distress.  Mood and behavior are normal.     ASSESSMENT and THERAPY PLAN:   Malignant neoplasm of upper-outer quadrant of right breast in female, estrogen receptor negative [facility]) Chemotherapy Management She is nearing the end of her chemotherapy treatment, with her last session scheduled for April 10th. She experiences significant side effects, including malaise and anorexia for two days post-injection. She expresses concern about adding new medications during this period. The decision to start Effexor is deferred until after her next chemotherapy cycle to avoid additional side effects during her recovery period. - Discuss the timing of starting Effexor in relation to  her chemotherapy cycle, allowing her to decide based on her comfort level with potential side effects.  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 She is apprehensive about the procedure, particularly due to the need for sedation. She has not yet had surgery to determine her post surgery HER2 therapy.  One consideration is Phesgo if she has good response to neadojuvant treatment, and I encouraged her to discuss this option with Dr. Pamelia Hoit at her next visit with him prior to cycle 6 of TCHP.     Anxiety She reports increased anxiety, particularly as she approaches the end of her chemotherapy treatment.GAD 7 score was 16, indicating significant anxiety. She experiences difficulty sleeping and increased distractibility, which may be related to her anxiety. Anxiety may be contributing to her sleep disturbances and hot flashes. Effexor is recommended as it can help manage both anxiety and hot flashes. Informed consent was obtained, discussing potential side effects such as headaches, sleep changes, and initial lack of benefit. The decision to start Effexor is deferred until after her next chemotherapy cycle to avoid additional side effects during her recovery period. - Prescribe Effexor XR (venlafaxine) to help manage anxiety and hot flashes. Start with the lowest dose of 37.5 mg, with the possibility to increase to 75 mg if needed for better management of hot flashes. - Discuss the option to start Effexor after her next chemotherapy cycle to avoid additional side effects during her recovery period. - Encourage her to consider starting Effexor and provide information in the after-visit summary for her to review.  Hot Flashes She experiences hot flashes, primarily sweating in her head at night, leading to wet pillows. Anxiety may be contributing to the hot flashes. Effexor is recommended as it can help manage both anxiety and hot flashes. Informed consent was  obtained, discussing potential side effects and the possibility of needing a higher dose for effective management of hot flashes. - Prescribe Effexor (venlafaxine) to help manage hot flashes, starting at 37.5 mg with the option to increase to 75 mg for better efficacy.   The patient was provided an opportunity to ask questions and all were answered. The patient agreed with the plan and demonstrated an understanding of the instructions.   The patient was advised to call back or seek an in-person evaluation if the symptoms worsen or if the condition fails to improve as anticipated.   I provided 15 minutes of non face-to-face telephone visit time during this encounter, and > 50% was spent counseling as documented under my assessment & plan.     Lillard Anes, NP 04/17/23 12:48 PM Medical Oncology and Hematology [facility] [address] [place], [place] [address] Tel. [phone]  Fax. [phone]  *Total Encounter Time as defined by the Centers for Medicare and Medicaid Services includes, in addition to the face-to-face time of a patient visit (documented in the note above) non-face-to-face time: obtaining and reviewing outside history, ordering and reviewing medications, tests or procedures, care coordination (communications with other health care professionals or caregivers) and documentation in the medical record.

## 2023-04-17 NOTE — Progress Notes (Signed)
 [facility] CSW Progress Note  Clinical Child psychotherapist received request from patient for updated treatment plan form to be sent to [employer].  Form completed and sent.     Ranisha Allaire E Cap Massi, LCSW Clinical Social Worker [facility]

## 2023-04-17 NOTE — Assessment & Plan Note (Signed)
 Chemotherapy Management She is nearing the end of her chemotherapy treatment, with her last session scheduled for April 10th. She experiences significant side effects, including malaise and anorexia for two days post-injection. She expresses concern about adding new medications during this period. The decision to start Effexor is deferred until after her next chemotherapy cycle to avoid additional side effects during her recovery period. - Discuss the timing of starting Effexor in relation to her chemotherapy cycle, allowing her to decide based on her comfort level with potential side effects.  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 She is apprehensive about the procedure, particularly due to the need for sedation. She has not yet had surgery to determine her post surgery HER2 therapy.  One consideration is Phesgo if she has good response to neadojuvant treatment, and I encouraged her to discuss this option with Dr. Pamelia Hoit at her next visit with him prior to cycle 6 of TCHP.     Anxiety She reports increased anxiety, particularly as she approaches the end of her chemotherapy treatment.GAD 7 score was 16, indicating significant anxiety. She experiences difficulty sleeping and increased distractibility, which may be related to her anxiety. Anxiety may be contributing to her sleep disturbances and hot flashes. Effexor is recommended as it can help manage both anxiety and hot flashes. Informed consent was obtained, discussing potential side effects such as headaches, sleep changes, and initial lack of benefit. The decision to start Effexor is deferred until after her next chemotherapy cycle to avoid additional side effects during her recovery period. - Prescribe Effexor XR (venlafaxine) to help manage anxiety and hot flashes. Start with the lowest dose of 37.5 mg, with the possibility to increase to 75 mg if needed for better management of hot flashes. -  Discuss the option to start Effexor after her next chemotherapy cycle to avoid additional side effects during her recovery period. - Encourage her to consider starting Effexor and provide information in the after-visit summary for her to review.  Hot Flashes She experiences hot flashes, primarily sweating in her head at night, leading to wet pillows. Anxiety may be contributing to the hot flashes. Effexor is recommended as it can help manage both anxiety and hot flashes. Informed consent was obtained, discussing potential side effects and the possibility of needing a higher dose for effective management of hot flashes. - Prescribe Effexor (venlafaxine) to help manage hot flashes, starting at 37.5 mg with the option to increase to 75 mg for better efficacy.

## 2023-04-25 ENCOUNTER — Ambulatory Visit (HOSPITAL_COMMUNITY)
Admission: RE | Admit: 2023-04-25 | Discharge: 2023-04-25 | Disposition: A | Discharge status: Home / Self Care | Source: Ambulatory Visit | Attending: Adult Health | Admitting: Adult Health

## 2023-04-25 DIAGNOSIS — Z09 Encounter for follow-up examination after completed treatment for conditions other than malignant neoplasm: Secondary | ICD-10-CM

## 2023-04-25 DIAGNOSIS — C50411 Malignant neoplasm of upper-outer quadrant of right female breast: Secondary | ICD-10-CM | POA: Diagnosis not present

## 2023-04-25 DIAGNOSIS — Z0189 Encounter for other specified special examinations: Secondary | ICD-10-CM

## 2023-04-25 DIAGNOSIS — I5030 Unspecified diastolic (congestive) heart failure: Secondary | ICD-10-CM

## 2023-04-25 DIAGNOSIS — Z171 Estrogen receptor negative status [ER-]: Secondary | ICD-10-CM

## 2023-04-25 LAB — ECHOCARDIOGRAM COMPLETE
Area-P 1/2: 4.45 cm2
Calc EF: 62.5 %
S' Lateral: 2.6 cm
Single Plane A2C EF: 64.6 %
Single Plane A4C EF: 60.4 %

## 2023-04-25 NOTE — Assessment & Plan Note (Signed)
 12/18/2022:Palpable right breast lump (irregular density) by ultrasound 1.3 cm at 9:30 position, ultrasound-guided biopsy: Grade 2 IDC no LVI, ER 0%, PR 0%, Ki67 30%, HER2 3+ positive, 1 axillary lymph node with cortical thickening biopsy: Positive    Breast MRI 01/08/2023: 1.6 cm mass, 2.4 cm non-mass enhancement: Biopsy: DCIS   Treatment plan: Neoadjuvant chemotherapy with TCH Perjeta followed by maintenance therapy based on pathologic response Breast conserving surgery (1 versus double lumpectomies) with targeted node dissection Adjuvant radiation therapy ----------------------------------------------------------------------------------------------------------------------------------------------- Current treatment: Cycle 6 TCHP Chemo toxicities: Hypersensitivity reaction to Emend (shortness of breath, abdominal pain) Chest pain on day 2: Went to emergency room, CT angiogram negative: Possibly acid reflux Fatigue day 4-5 Microcytic anemia: I sent a prescription for iron supplementation.   Severe leukopenia: Unclear why the counts are low in spite of Neulasta. I would like her to get a CBC checked tomorrow before the start of her treatment. It is fine to proceed with the treatment with an ANC of 1.2. But the repeat CBC will be double check on the [facility]   Breast MRI has been ordered. Follow-up in 3 weeks for Herceptin Perjeta maintenance. Further systemic therapy after that would be determined by surgical results.

## 2023-04-25 NOTE — Progress Notes (Signed)
  Echocardiogram 2D Echocardiogram has been performed.  Kara Webb 04/25/2023, 10:53 AM

## 2023-04-26 ENCOUNTER — Other Ambulatory Visit: Payer: Self-pay

## 2023-04-26 ENCOUNTER — Inpatient Hospital Stay (HOSPITAL_BASED_OUTPATIENT_CLINIC_OR_DEPARTMENT_OTHER): Payer: BC Managed Care – PPO | Admitting: Hematology and Oncology

## 2023-04-26 ENCOUNTER — Encounter: Payer: Self-pay | Admitting: General Practice

## 2023-04-26 ENCOUNTER — Other Ambulatory Visit: Payer: Self-pay | Admitting: Obstetrics and Gynecology

## 2023-04-26 ENCOUNTER — Inpatient Hospital Stay: Payer: BC Managed Care – PPO

## 2023-04-26 VITALS — BP 138/89 | HR 98 | Temp 98.0°F | Resp 18 | Ht 66.0 in | Wt 279.0 lb

## 2023-04-26 DIAGNOSIS — D509 Iron deficiency anemia, unspecified: Secondary | ICD-10-CM | POA: Diagnosis not present

## 2023-04-26 DIAGNOSIS — C50411 Malignant neoplasm of upper-outer quadrant of right female breast: Secondary | ICD-10-CM | POA: Diagnosis not present

## 2023-04-26 DIAGNOSIS — Z87891 Personal history of nicotine dependence: Secondary | ICD-10-CM | POA: Diagnosis not present

## 2023-04-26 DIAGNOSIS — Z171 Estrogen receptor negative status [ER-]: Secondary | ICD-10-CM

## 2023-04-26 DIAGNOSIS — R232 Flushing: Secondary | ICD-10-CM | POA: Diagnosis not present

## 2023-04-26 DIAGNOSIS — Z1231 Encounter for screening mammogram for malignant neoplasm of breast: Secondary | ICD-10-CM

## 2023-04-26 DIAGNOSIS — Z7952 Long term (current) use of systemic steroids: Secondary | ICD-10-CM | POA: Diagnosis not present

## 2023-04-26 DIAGNOSIS — D709 Neutropenia, unspecified: Secondary | ICD-10-CM | POA: Diagnosis not present

## 2023-04-26 DIAGNOSIS — Z5111 Encounter for antineoplastic chemotherapy: Secondary | ICD-10-CM | POA: Diagnosis not present

## 2023-04-26 DIAGNOSIS — Z800 Family history of malignant neoplasm of digestive organs: Secondary | ICD-10-CM | POA: Diagnosis not present

## 2023-04-26 DIAGNOSIS — Z87440 Personal history of urinary (tract) infections: Secondary | ICD-10-CM | POA: Diagnosis not present

## 2023-04-26 DIAGNOSIS — Z5189 Encounter for other specified aftercare: Secondary | ICD-10-CM | POA: Diagnosis not present

## 2023-04-26 DIAGNOSIS — Z803 Family history of malignant neoplasm of breast: Secondary | ICD-10-CM | POA: Diagnosis not present

## 2023-04-26 DIAGNOSIS — R079 Chest pain, unspecified: Secondary | ICD-10-CM | POA: Diagnosis not present

## 2023-04-26 DIAGNOSIS — Z79899 Other long term (current) drug therapy: Secondary | ICD-10-CM | POA: Diagnosis not present

## 2023-04-26 DIAGNOSIS — G4700 Insomnia, unspecified: Secondary | ICD-10-CM | POA: Diagnosis not present

## 2023-04-26 LAB — CBC WITH DIFFERENTIAL (CANCER CENTER ONLY)
Abs Immature Granulocytes: 0.01 10*3/uL (ref 0.00–0.07)
Basophils Absolute: 0 10*3/uL (ref 0.0–0.1)
Basophils Relative: 0 %
Eosinophils Absolute: 0 10*3/uL (ref 0.0–0.5)
Eosinophils Relative: 0 %
HCT: 32.3 % — ABNORMAL LOW (ref 36.0–46.0)
Hemoglobin: 10.2 g/dL — ABNORMAL LOW (ref 12.0–15.0)
Immature Granulocytes: 0 %
Lymphocytes Relative: 12 %
Lymphs Abs: 0.6 10*3/uL — ABNORMAL LOW (ref 0.7–4.0)
MCH: 26 pg (ref 26.0–34.0)
MCHC: 31.6 g/dL (ref 30.0–36.0)
MCV: 82.4 fL (ref 80.0–100.0)
Monocytes Absolute: 0.6 10*3/uL (ref 0.1–1.0)
Monocytes Relative: 11 %
Neutro Abs: 4.1 10*3/uL (ref 1.7–7.7)
Neutrophils Relative %: 77 %
Platelet Count: 206 10*3/uL (ref 150–400)
RBC: 3.92 MIL/uL (ref 3.87–5.11)
RDW: 22.8 % — ABNORMAL HIGH (ref 11.5–15.5)
WBC Count: 5.3 10*3/uL (ref 4.0–10.5)
nRBC: 0 % (ref 0.0–0.2)

## 2023-04-26 LAB — PREGNANCY, URINE: Preg Test, Ur: NEGATIVE

## 2023-04-26 LAB — CMP (CANCER CENTER ONLY)
ALT: 153 U/L — ABNORMAL HIGH (ref 0–44)
AST: 66 U/L — ABNORMAL HIGH (ref 15–41)
Albumin: 4.2 g/dL (ref 3.5–5.0)
Alkaline Phosphatase: 114 U/L (ref 38–126)
Anion gap: 4 — ABNORMAL LOW (ref 5–15)
BUN: 17 mg/dL (ref 6–20)
CO2: 27 mmol/L (ref 22–32)
Calcium: 9.8 mg/dL (ref 8.9–10.3)
Chloride: 106 mmol/L (ref 98–111)
Creatinine: 0.79 mg/dL (ref 0.44–1.00)
GFR, Estimated: >60 mL/min (ref >60)
Glucose, Bld: 121 mg/dL — ABNORMAL HIGH (ref 70–99)
Potassium: 4.1 mmol/L (ref 3.5–5.1)
Sodium: 137 mmol/L (ref 135–145)
Total Bilirubin: 0.2 mg/dL (ref 0.0–1.2)
Total Protein: 7.7 g/dL (ref 6.5–8.1)

## 2023-04-26 MED ORDER — ACETAMINOPHEN 325 MG PO TABS
650.0000 mg | ORAL_TABLET | Freq: Once | ORAL | Status: AC
  Administered 2023-04-26: 650 mg via ORAL
  Filled 2023-04-26: qty 2

## 2023-04-26 MED ORDER — DEXAMETHASONE SODIUM PHOSPHATE 10 MG/ML IJ SOLN
10.0000 mg | Freq: Once | INTRAMUSCULAR | Status: AC
  Administered 2023-04-26: 10 mg via INTRAVENOUS
  Filled 2023-04-26: qty 1

## 2023-04-26 MED ORDER — SODIUM CHLORIDE 0.9 % IV SOLN
150.0000 mg | Freq: Once | INTRAVENOUS | Status: AC
  Administered 2023-04-26: 150 mg via INTRAVENOUS
  Filled 2023-04-26: qty 15

## 2023-04-26 MED ORDER — DIPHENHYDRAMINE HCL 25 MG PO CAPS
50.0000 mg | ORAL_CAPSULE | Freq: Once | ORAL | Status: AC
  Administered 2023-04-26: 50 mg via ORAL
  Filled 2023-04-26: qty 2

## 2023-04-26 MED ORDER — SODIUM CHLORIDE 0.9 % IV SOLN: INTRAVENOUS | Status: DC

## 2023-04-26 MED ORDER — HEPARIN SOD (PORK) LOCK FLUSH 100 UNIT/ML IV SOLN
500.0000 [IU] | Freq: Once | INTRAVENOUS | Status: AC | PRN
  Administered 2023-04-26: 500 [IU]

## 2023-04-26 MED ORDER — SODIUM CHLORIDE 0.9% FLUSH
10.0000 mL | INTRAVENOUS | Status: DC | PRN
  Administered 2023-04-26: 10 mL

## 2023-04-26 MED ORDER — PALONOSETRON HCL INJECTION 0.25 MG/5ML
0.2500 mg | Freq: Once | INTRAVENOUS | Status: AC
  Administered 2023-04-26: 0.25 mg via INTRAVENOUS
  Filled 2023-04-26: qty 5

## 2023-04-26 MED ORDER — PERTUZUMAB CHEMO INJECTION 420 MG/14ML
420.0000 mg | Freq: Once | INTRAVENOUS | Status: AC
  Administered 2023-04-26: 420 mg via INTRAVENOUS
  Filled 2023-04-26: qty 14

## 2023-04-26 MED ORDER — SODIUM CHLORIDE 0.9 % IV SOLN
50.0000 mg/m2 | Freq: Once | INTRAVENOUS | Status: AC
  Administered 2023-04-26: 124 mg via INTRAVENOUS
  Filled 2023-04-26: qty 12.4

## 2023-04-26 MED ORDER — SODIUM CHLORIDE 0.9 % IV SOLN
750.0000 mg | Freq: Once | INTRAVENOUS | Status: AC
  Administered 2023-04-26: 750 mg via INTRAVENOUS
  Filled 2023-04-26: qty 35.71

## 2023-04-26 NOTE — Patient Instructions (Addendum)
 [facility] - A DEPT OF [facility]  Discharge Instructions: Thank you for choosing [facility] to provide your oncology and hematology care.   If you have a lab appointment with the Cancer Center, please go directly to the Cancer Center and check in at the registration area.   Wear comfortable clothing and clothing appropriate for easy access to any Portacath or PICC line.   We strive to give you quality time with your provider. You may need to reschedule your appointment if you arrive late (15 or more minutes).  Arriving late affects you and other patients whose appointments are after yours.  Also, if you miss three or more appointments without notifying the office, you may be dismissed from the clinic at the provider's discretion.      For prescription refill requests, have your pharmacy contact our office and allow 72 hours for refills to be completed.    Today you received the following chemotherapy and/or immunotherapy agents trastuzumab, pertuzumab, taxotere, carboplatin To help prevent nausea and vomiting after your treatment, we encourage you to take your nausea medication as directed.  BELOW ARE SYMPTOMS THAT SHOULD BE REPORTED IMMEDIATELY: *FEVER GREATER THAN 100.4 F (38 C) OR HIGHER *CHILLS OR SWEATING *NAUSEA AND VOMITING THAT IS NOT CONTROLLED WITH YOUR NAUSEA MEDICATION *UNUSUAL SHORTNESS OF BREATH *UNUSUAL BRUISING OR BLEEDING *URINARY PROBLEMS (pain or burning when urinating, or frequent urination) *BOWEL PROBLEMS (unusual diarrhea, constipation, pain near the anus) TENDERNESS IN MOUTH AND THROAT WITH OR WITHOUT PRESENCE OF ULCERS (sore throat, sores in mouth, or a toothache) UNUSUAL RASH, SWELLING OR PAIN  UNUSUAL VAGINAL DISCHARGE OR ITCHING   Items with * indicate a potential emergency and should be followed up as soon as possible or go to the Emergency Department if any problems should occur.  Please show the  CHEMOTHERAPY ALERT CARD or IMMUNOTHERAPY ALERT CARD at check-in to the Emergency Department and triage nurse.  Should you have questions after your visit or need to cancel or reschedule your appointment, please contact [facility] - A DEPT OF [facility]  Dept: [phone]  and follow the prompts.  Office hours are 8:00 a.m. to 4:30 p.m. Monday - Friday. Please note that voicemails left after 4:00 p.m. may not be returned until the following business day.  We are closed weekends and major holidays. You have access to a nurse at all times for urgent questions. Please call the main number to the clinic Dept: [phone] and follow the prompts.   For any non-urgent questions, you may also contact your provider using MyChart. We now offer e-Visits for anyone 59 and older to request care online for non-urgent symptoms. For details visit [email or web address].   Also download the MyChart app! Go to the app store, search "MyChart", open the app, select [facility], and log in with your MyChart username and password.

## 2023-04-26 NOTE — Patient Instructions (Signed)

## 2023-04-26 NOTE — Progress Notes (Signed)
 Patient Care Team: Kara Able, MD as PCP - General (Family Medicine) Donnelly Angelica, RN as Oncology Nurse Navigator Lu Duffel, Margretta Ditty, RN as Oncology Nurse Navigator Antony Blackbird, MD as Consulting Physician (Radiation Oncology) Serena Croissant, MD as Consulting Physician (Hematology and Oncology) Harriette Bouillon, MD as Consulting Physician (General Surgery)  DIAGNOSIS:  Encounter Diagnosis  Name Primary?   Malignant neoplasm of upper-outer quadrant of right breast in female, estrogen receptor negative (HCC) Yes    SUMMARY OF ONCOLOGIC HISTORY: Oncology History  Malignant neoplasm of upper-outer quadrant of right breast in female, estrogen receptor negative (HCC)  12/18/2022 Initial Diagnosis   Palpable right breast lump (irregular density) by ultrasound 1.3 cm at 9:30 position, ultrasound-guided biopsy: Grade 2 IDC no LVI, ER 0%, PR 0%, Ki67 30%, HER2 3+ positive, 1 axillary lymph node with cortical thickening biopsy: Positive   12/27/2022 Cancer Staging   Staging form: Breast, AJCC 8th Edition - Clinical: Stage IIA (cT1c, cN1, cM0, G2, ER-, PR-, HER2+) - Signed by Serena Croissant, MD on 12/27/2022 Stage prefix: Initial diagnosis Histologic grading system: 3 grade system   01/11/2023 Genetic Testing   Negative [facility] CancerNext +RNAinsight Panel.  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 Report date is 01/11/2023.   The [facility] CancerNext+RNAinsight Panel includes sequencing, rearrangement analysis, and RNA analysis for the following 39 genes: APC, ATM, BAP1, BARD1, BMPR1A, BRCA1, BRCA2, BRIP1, CDH1, CDKN2A, CHEK2, FH, FLCN, MET, MLH1, MSH2, MSH6, MUTYH, NF1, NTHL1, PALB2, PMS2, PTEN, RAD51C, RAD51D, SMAD4, STK11, TP53, TSC1, TSC2, and VHL (sequencing and deletion/duplication); AXIN2, HOXB13, MBD4, MSH3, POLD1 and POLE (sequencing only); EPCAM and GREM1 (deletion/duplication only).    01/12/2023 -  Chemotherapy   Patient is on Treatment Plan : BREAST  Docetaxel + Carboplatin + Trastuzumab +  Pertuzumab  (TCHP) q21d        CHIEF COMPLIANT: Cycle 6 TCHP  HISTORY OF PRESENT ILLNESS:   History of Present Illness The patient, with a history of breast cancer, is currently undergoing chemotherapy. She reports issues with her port, specifically fibrin sheath formation, which has caused difficulties with blood return. Despite this, the patient has been Webb to continue with her chemotherapy regimen. She has been experiencing hot flashes and insomnia, which she attributes to the chemotherapy. The patient expresses a preference to avoid additional sedation and invasive procedures if possible, and is open to the possibility of injections for maintenance therapy instead of using the port. She also expresses anxiety about upcoming MRI and surgery, and has concerns about potential side effects of new medications.     ALLERGIES:  is allergic to [facility] dimeglumine].  MEDICATIONS:  Current Outpatient Medications  Medication Sig Dispense Refill   ALPRAZolam (XANAX) 0.5 MG tablet Take 1 tablet (0.5 mg total) by mouth at bedtime as needed for anxiety or sleep. 20 tablet 0   dexamethasone (DECADRON) 4 MG tablet Take 2 tabs by mouth 2 times daily starting day before chemo. Then take 2 tabs daily for 2 days starting day after chemo. Take with food. 30 tablet 1   ferrous gluconate (FERGON) 324 MG tablet Take 1 tablet (324 mg total) by mouth 2 (two) times daily with a meal. 60 tablet 6   azithromycin (ZITHROMAX Z-PAK) 250 MG tablet Per instructions. Only take if symptoms worsen and flu/covid negative, notify cancer center prior to starting 6 each 0   IUD'S IU IUD's     lidocaine-prilocaine (EMLA) cream Apply to affected area once 30 g 3   ondansetron (ZOFRAN) 8 MG  tablet Take 1 tablet (8 mg total) by mouth every 8 (eight) hours as needed for nausea or vomiting. Start on the third day after chemotherapy. 30 tablet 1   prochlorperazine (COMPAZINE) 10 MG tablet Take 1 tablet (10 mg total) by  mouth every 6 (six) hours as needed for nausea or vomiting. 30 tablet 1   venlafaxine XR (EFFEXOR-XR) 37.5 MG 24 hr capsule Take 1 capsule (37.5 mg total) by mouth daily with breakfast. 30 capsule 1   No current facility-administered medications for this visit.    PHYSICAL EXAMINATION: ECOG PERFORMANCE STATUS: 1 - Symptomatic but completely ambulatory  Vitals:   04/26/23 1007  BP: 138/89  Pulse: 98  Resp: 18  Temp: 98 F (36.7 C)  SpO2: 100%   Filed Weights   04/26/23 1007  Weight: 279 lb (126.6 kg)      LABORATORY DATA:  I have reviewed the data as listed    Latest Ref Rng & Units 04/26/2023    9:54 AM 04/05/2023    7:42 AM 03/15/2023    9:53 AM  CMP  Glucose 70 - 99 mg/dL 956  213  086   BUN 6 - 20 mg/dL 17  14  12    Creatinine 0.44 - 1.00 mg/dL 5.78  4.69  6.29   Sodium 135 - 145 mmol/L 137  138  141   Potassium 3.5 - 5.1 mmol/L 4.1  3.6  3.8   Chloride 98 - 111 mmol/L 106  106  108   CO2 22 - 32 mmol/L 27  26  30    Calcium 8.9 - 10.3 mg/dL 9.8  9.7  9.3   Total Protein 6.5 - 8.1 g/dL 7.7  7.5  7.1   Total Bilirubin 0.0 - 1.2 mg/dL 0.2  0.2  0.2   Alkaline Phos 38 - 126 U/L 114  81  71   AST 15 - 41 U/L 66  25  16   ALT 0 - 44 U/L 153  50  19     Lab Results  Component Value Date   WBC 5.3 04/26/2023   HGB 10.2 (L) 04/26/2023   HCT 32.3 (L) 04/26/2023   MCV 82.4 04/26/2023   PLT 206 04/26/2023   NEUTROABS 4.1 04/26/2023    ASSESSMENT & PLAN:  Malignant neoplasm of upper-outer quadrant of right breast in female, estrogen receptor negative (HCC) 12/18/2022:Palpable right breast lump (irregular density) by ultrasound 1.3 cm at 9:30 position, ultrasound-guided biopsy: Grade 2 IDC no LVI, ER 0%, PR 0%, Ki67 30%, HER2 3+ positive, 1 axillary lymph node with cortical thickening biopsy: Positive    Breast MRI 01/08/2023: 1.6 cm mass, 2.4 cm non-mass enhancement: Biopsy: DCIS   Treatment plan: Neoadjuvant chemotherapy with TCH Perjeta followed by maintenance  therapy based on pathologic response Breast conserving surgery (1 versus double lumpectomies) with targeted node dissection Adjuvant radiation therapy ----------------------------------------------------------------------------------------------------------------------------------------------- Current treatment: Cycle 6 TCHP Chemo toxicities: Hypersensitivity reaction to Emend (shortness of breath, abdominal pain) Chest pain on day 2: Went to emergency room, CT angiogram negative: Possibly acid reflux Fatigue day 4-5 Microcytic anemia: I sent a prescription for iron supplementation.   Severe leukopenia: Unclear why the counts are low in spite of Neulasta. I would like her to get a CBC checked tomorrow before the start of her treatment. It is fine to proceed with the treatment with an ANC of 1.2. But the repeat CBC will be double check on the [facility]   Breast MRI has been ordered. Follow-up in 3  weeks for Herceptin Perjeta maintenance. Further systemic therapy after that would be determined by surgical results. ------------------------------------- Assessment and Plan Assessment & Plan   Fluid management post-chemotherapy Requests scheduling of post-chemotherapy fluids to manage side effects and maintain hydration. - Administer fluids on April 19.  Hot flashes Experiencing hot flashes post-chemotherapy, common with treatment and hormonal changes. - Consider medication for hot flashes if symptoms persist.  Insomnia Reports insomnia, Xanax ineffective. Considering new anti-anxiety and antidepressant medication prescribed by Lillia Abed, which may aid sleep and anxiety. Medication not yet started, requires three weeks for effect. - Initiate prescribed anti-anxiety and antidepressant if insomnia persists.  Post-surgical care Advised against wearing underwire bra post-surgery to prevent discomfort and complications. - Avoid underwire bras post-surgery.  Follow-up Follow-up with surgeon on  April 21. MRI on April 11 for surgical planning. - Attend surgeon follow-up on April 21. - Review MRI results for further treatment and surgery planning.      No orders of the defined types were placed in this encounter.  The patient has a good understanding of the overall plan. she agrees with it. she will call with any problems that may develop before the next visit here. Total time spent: 30 mins including face to face time and time spent for planning, charting and co-ordination of care   Tamsen Meek, MD 04/26/23

## 2023-04-26 NOTE — Progress Notes (Signed)
Per Dr Lindi Adie, ok to treat with elevated LFTs.

## 2023-04-26 NOTE — Progress Notes (Signed)
 [facility] Spiritual Care Note  Kara Webb requested a follow-up visit in infusion for Spiritual Care check-in. She reports that she is doing well overall and grateful to be completing chemotherapy. She also notes that this has been a particularly anxious time with so many appointments, including the upcoming MRI., which has made sleep more difficult.  Provided compassionate presence, reflective listening, emotional support, and normalization of feelings.  Kara Webb values opportunity to share and process her feelings and experiences, and she plans to follow up as desired--including after MRI if needed, likely during her radiation series, and also after radiation to plan a Hope Bell-Ringing Ceremony to acknowledge full completion of her active treatment plan.   [address] [place], [place], [facility] Pager [phone] Voicemail [phone]

## 2023-04-27 ENCOUNTER — Telehealth: Payer: Self-pay | Admitting: Hematology and Oncology

## 2023-04-27 ENCOUNTER — Ambulatory Visit
Admission: RE | Admit: 2023-04-27 | Discharge: 2023-04-27 | Disposition: A | Discharge status: Home / Self Care | Source: Ambulatory Visit | Attending: Hematology and Oncology | Admitting: Hematology and Oncology

## 2023-04-27 DIAGNOSIS — Z171 Estrogen receptor negative status [ER-]: Secondary | ICD-10-CM

## 2023-04-27 DIAGNOSIS — D0511 Intraductal carcinoma in situ of right breast: Secondary | ICD-10-CM | POA: Diagnosis not present

## 2023-04-27 MED ORDER — GADOPICLENOL 0.5 MMOL/ML IV SOLN
10.0000 mL | Freq: Once | INTRAVENOUS | Status: AC | PRN
  Administered 2023-04-27: 10 mL via INTRAVENOUS

## 2023-04-27 NOTE — Telephone Encounter (Signed)
 Spoke with patient confirming upcoming appointment

## 2023-04-28 ENCOUNTER — Inpatient Hospital Stay: Payer: BC Managed Care – PPO

## 2023-04-28 VITALS — BP 113/85 | HR 86 | Resp 17

## 2023-04-28 DIAGNOSIS — Z7952 Long term (current) use of systemic steroids: Secondary | ICD-10-CM | POA: Diagnosis not present

## 2023-04-28 DIAGNOSIS — Z171 Estrogen receptor negative status [ER-]: Secondary | ICD-10-CM | POA: Diagnosis not present

## 2023-04-28 DIAGNOSIS — Z79899 Other long term (current) drug therapy: Secondary | ICD-10-CM | POA: Diagnosis not present

## 2023-04-28 DIAGNOSIS — D709 Neutropenia, unspecified: Secondary | ICD-10-CM | POA: Diagnosis not present

## 2023-04-28 DIAGNOSIS — C50411 Malignant neoplasm of upper-outer quadrant of right female breast: Secondary | ICD-10-CM

## 2023-04-28 DIAGNOSIS — Z87891 Personal history of nicotine dependence: Secondary | ICD-10-CM | POA: Diagnosis not present

## 2023-04-28 DIAGNOSIS — D509 Iron deficiency anemia, unspecified: Secondary | ICD-10-CM | POA: Diagnosis not present

## 2023-04-28 DIAGNOSIS — R079 Chest pain, unspecified: Secondary | ICD-10-CM | POA: Diagnosis not present

## 2023-04-28 DIAGNOSIS — G4700 Insomnia, unspecified: Secondary | ICD-10-CM | POA: Diagnosis not present

## 2023-04-28 DIAGNOSIS — Z803 Family history of malignant neoplasm of breast: Secondary | ICD-10-CM | POA: Diagnosis not present

## 2023-04-28 DIAGNOSIS — Z800 Family history of malignant neoplasm of digestive organs: Secondary | ICD-10-CM | POA: Diagnosis not present

## 2023-04-28 DIAGNOSIS — R232 Flushing: Secondary | ICD-10-CM | POA: Diagnosis not present

## 2023-04-28 DIAGNOSIS — Z5111 Encounter for antineoplastic chemotherapy: Secondary | ICD-10-CM | POA: Diagnosis not present

## 2023-04-28 DIAGNOSIS — Z87440 Personal history of urinary (tract) infections: Secondary | ICD-10-CM | POA: Diagnosis not present

## 2023-04-28 DIAGNOSIS — Z5189 Encounter for other specified aftercare: Secondary | ICD-10-CM | POA: Diagnosis not present

## 2023-04-28 MED ORDER — PEGFILGRASTIM-JMDB 6 MG/0.6ML ~~LOC~~ SOSY
6.0000 mg | PREFILLED_SYRINGE | Freq: Once | SUBCUTANEOUS | Status: AC
  Administered 2023-04-28: 6 mg via SUBCUTANEOUS

## 2023-04-30 ENCOUNTER — Encounter: Payer: Self-pay | Admitting: *Deleted

## 2023-05-05 ENCOUNTER — Inpatient Hospital Stay

## 2023-05-05 VITALS — BP 118/77 | HR 92 | Temp 97.8°F | Resp 17

## 2023-05-05 DIAGNOSIS — Z87891 Personal history of nicotine dependence: Secondary | ICD-10-CM | POA: Diagnosis not present

## 2023-05-05 DIAGNOSIS — G4700 Insomnia, unspecified: Secondary | ICD-10-CM | POA: Diagnosis not present

## 2023-05-05 DIAGNOSIS — R232 Flushing: Secondary | ICD-10-CM | POA: Diagnosis not present

## 2023-05-05 DIAGNOSIS — Z5189 Encounter for other specified aftercare: Secondary | ICD-10-CM | POA: Diagnosis not present

## 2023-05-05 DIAGNOSIS — Z803 Family history of malignant neoplasm of breast: Secondary | ICD-10-CM | POA: Diagnosis not present

## 2023-05-05 DIAGNOSIS — C50411 Malignant neoplasm of upper-outer quadrant of right female breast: Secondary | ICD-10-CM | POA: Diagnosis not present

## 2023-05-05 DIAGNOSIS — Z171 Estrogen receptor negative status [ER-]: Secondary | ICD-10-CM | POA: Diagnosis not present

## 2023-05-05 DIAGNOSIS — Z95828 Presence of other vascular implants and grafts: Secondary | ICD-10-CM

## 2023-05-05 DIAGNOSIS — D709 Neutropenia, unspecified: Secondary | ICD-10-CM | POA: Diagnosis not present

## 2023-05-05 DIAGNOSIS — D509 Iron deficiency anemia, unspecified: Secondary | ICD-10-CM | POA: Diagnosis not present

## 2023-05-05 DIAGNOSIS — Z87440 Personal history of urinary (tract) infections: Secondary | ICD-10-CM | POA: Diagnosis not present

## 2023-05-05 DIAGNOSIS — Z800 Family history of malignant neoplasm of digestive organs: Secondary | ICD-10-CM | POA: Diagnosis not present

## 2023-05-05 DIAGNOSIS — R079 Chest pain, unspecified: Secondary | ICD-10-CM | POA: Diagnosis not present

## 2023-05-05 DIAGNOSIS — Z5111 Encounter for antineoplastic chemotherapy: Secondary | ICD-10-CM | POA: Diagnosis not present

## 2023-05-05 DIAGNOSIS — Z7952 Long term (current) use of systemic steroids: Secondary | ICD-10-CM | POA: Diagnosis not present

## 2023-05-05 DIAGNOSIS — Z79899 Other long term (current) drug therapy: Secondary | ICD-10-CM | POA: Diagnosis not present

## 2023-05-05 MED ORDER — SODIUM CHLORIDE 0.9 % IV SOLN: Freq: Once | INTRAVENOUS | Status: AC

## 2023-05-07 ENCOUNTER — Ambulatory Visit: Payer: Self-pay | Admitting: Surgery

## 2023-05-07 DIAGNOSIS — C50911 Malignant neoplasm of unspecified site of right female breast: Secondary | ICD-10-CM

## 2023-05-07 DIAGNOSIS — C50411 Malignant neoplasm of upper-outer quadrant of right female breast: Secondary | ICD-10-CM | POA: Diagnosis not present

## 2023-05-07 DIAGNOSIS — Z171 Estrogen receptor negative status [ER-]: Secondary | ICD-10-CM | POA: Diagnosis not present

## 2023-05-08 ENCOUNTER — Other Ambulatory Visit: Payer: Self-pay | Admitting: Surgery

## 2023-05-08 DIAGNOSIS — C50911 Malignant neoplasm of unspecified site of right female breast: Secondary | ICD-10-CM

## 2023-05-09 ENCOUNTER — Other Ambulatory Visit: Payer: Self-pay

## 2023-05-10 ENCOUNTER — Other Ambulatory Visit: Payer: Self-pay

## 2023-05-11 ENCOUNTER — Encounter: Payer: Self-pay | Admitting: *Deleted

## 2023-05-11 NOTE — Progress Notes (Signed)
 Per pt request RN successfully faxed bra script to [facility].

## 2023-05-17 ENCOUNTER — Encounter: Payer: Self-pay | Admitting: Hematology and Oncology

## 2023-05-17 ENCOUNTER — Inpatient Hospital Stay: Admitting: Hematology and Oncology

## 2023-05-17 ENCOUNTER — Inpatient Hospital Stay: Attending: Hematology and Oncology

## 2023-05-17 ENCOUNTER — Other Ambulatory Visit: Payer: Self-pay | Admitting: Hematology and Oncology

## 2023-05-17 ENCOUNTER — Inpatient Hospital Stay

## 2023-05-17 ENCOUNTER — Encounter: Payer: Self-pay | Admitting: *Deleted

## 2023-05-17 VITALS — BP 134/82 | HR 96 | Temp 98.0°F | Resp 17 | Ht 66.0 in | Wt 280.4 lb

## 2023-05-17 DIAGNOSIS — Z5112 Encounter for antineoplastic immunotherapy: Secondary | ICD-10-CM | POA: Diagnosis not present

## 2023-05-17 DIAGNOSIS — C50411 Malignant neoplasm of upper-outer quadrant of right female breast: Secondary | ICD-10-CM | POA: Diagnosis not present

## 2023-05-17 DIAGNOSIS — Z171 Estrogen receptor negative status [ER-]: Secondary | ICD-10-CM

## 2023-05-17 DIAGNOSIS — Z7952 Long term (current) use of systemic steroids: Secondary | ICD-10-CM | POA: Diagnosis not present

## 2023-05-17 DIAGNOSIS — Z79899 Other long term (current) drug therapy: Secondary | ICD-10-CM | POA: Diagnosis not present

## 2023-05-17 DIAGNOSIS — T50905A Adverse effect of unspecified drugs, medicaments and biological substances, initial encounter: Secondary | ICD-10-CM

## 2023-05-17 DIAGNOSIS — Z95828 Presence of other vascular implants and grafts: Secondary | ICD-10-CM

## 2023-05-17 LAB — CMP (CANCER CENTER ONLY)
ALT: 114 U/L — ABNORMAL HIGH (ref 0–44)
AST: 81 U/L — ABNORMAL HIGH (ref 15–41)
Albumin: 3.8 g/dL (ref 3.5–5.0)
Alkaline Phosphatase: 117 U/L (ref 38–126)
Anion gap: 4 — ABNORMAL LOW (ref 5–15)
BUN: 14 mg/dL (ref 6–20)
CO2: 29 mmol/L (ref 22–32)
Calcium: 9.1 mg/dL (ref 8.9–10.3)
Chloride: 107 mmol/L (ref 98–111)
Creatinine: 0.84 mg/dL (ref 0.44–1.00)
GFR, Estimated: >60 mL/min (ref >60)
Glucose, Bld: 121 mg/dL — ABNORMAL HIGH (ref 70–99)
Potassium: 4 mmol/L (ref 3.5–5.1)
Sodium: 140 mmol/L (ref 135–145)
Total Bilirubin: 0.3 mg/dL (ref 0.0–1.2)
Total Protein: 7.1 g/dL (ref 6.5–8.1)

## 2023-05-17 LAB — CBC WITH DIFFERENTIAL (CANCER CENTER ONLY)
Abs Immature Granulocytes: 0 10*3/uL (ref 0.00–0.07)
Basophils Absolute: 0 10*3/uL (ref 0.0–0.1)
Basophils Relative: 1 %
Eosinophils Absolute: 0 10*3/uL (ref 0.0–0.5)
Eosinophils Relative: 1 %
HCT: 32 % — ABNORMAL LOW (ref 36.0–46.0)
Hemoglobin: 10.1 g/dL — ABNORMAL LOW (ref 12.0–15.0)
Immature Granulocytes: 0 %
Lymphocytes Relative: 48 %
Lymphs Abs: 1 10*3/uL (ref 0.7–4.0)
MCH: 26.6 pg (ref 26.0–34.0)
MCHC: 31.6 g/dL (ref 30.0–36.0)
MCV: 84.4 fL (ref 80.0–100.0)
Monocytes Absolute: 0.3 10*3/uL (ref 0.1–1.0)
Monocytes Relative: 15 %
Neutro Abs: 0.7 10*3/uL — ABNORMAL LOW (ref 1.7–7.7)
Neutrophils Relative %: 35 %
Platelet Count: 236 10*3/uL (ref 150–400)
RBC: 3.79 MIL/uL — ABNORMAL LOW (ref 3.87–5.11)
RDW: 19.3 % — ABNORMAL HIGH (ref 11.5–15.5)
WBC Count: 2 10*3/uL — ABNORMAL LOW (ref 4.0–10.5)
nRBC: 0 % (ref 0.0–0.2)

## 2023-05-17 LAB — PREGNANCY, URINE: Preg Test, Ur: NEGATIVE

## 2023-05-17 MED ORDER — SODIUM CHLORIDE 0.9% FLUSH
10.0000 mL | Freq: Once | INTRAVENOUS | Status: AC
Start: 2023-05-17 — End: 2023-05-17
  Administered 2023-05-17: 10 mL

## 2023-05-17 MED ORDER — SODIUM CHLORIDE 0.9 % IV SOLN
420.0000 mg | Freq: Once | INTRAVENOUS | Status: AC
  Administered 2023-05-17: 420 mg via INTRAVENOUS
  Filled 2023-05-17: qty 14

## 2023-05-17 MED ORDER — DIPHENHYDRAMINE HCL 25 MG PO CAPS
50.0000 mg | ORAL_CAPSULE | Freq: Once | ORAL | Status: AC
  Administered 2023-05-17: 50 mg via ORAL
  Filled 2023-05-17: qty 2

## 2023-05-17 MED ORDER — SODIUM CHLORIDE 0.9 % IV SOLN: INTRAVENOUS | Status: DC

## 2023-05-17 MED ORDER — HEPARIN SOD (PORK) LOCK FLUSH 100 UNIT/ML IV SOLN
500.0000 [IU] | Freq: Once | INTRAVENOUS | Status: AC | PRN
  Administered 2023-05-17: 500 [IU]

## 2023-05-17 MED ORDER — SODIUM CHLORIDE 0.9 % IV SOLN
750.0000 mg | Freq: Once | INTRAVENOUS | Status: AC
  Administered 2023-05-17: 750 mg via INTRAVENOUS
  Filled 2023-05-17: qty 35.71

## 2023-05-17 MED ORDER — SODIUM CHLORIDE 0.9% FLUSH
10.0000 mL | INTRAVENOUS | Status: DC | PRN
  Administered 2023-05-17: 10 mL

## 2023-05-17 MED ORDER — ACETAMINOPHEN 325 MG PO TABS
650.0000 mg | ORAL_TABLET | Freq: Once | ORAL | Status: AC
  Administered 2023-05-17: 650 mg via ORAL
  Filled 2023-05-17: qty 2

## 2023-05-17 NOTE — Assessment & Plan Note (Signed)
 12/18/2022:Palpable right breast lump (irregular density) by ultrasound 1.3 cm at 9:30 position, ultrasound-guided biopsy: Grade 2 IDC no LVI, ER 0%, PR 0%, Ki67 30%, HER2 3+ positive, 1 axillary lymph node with cortical thickening biopsy: Positive    Breast MRI 01/08/2023: 1.6 cm mass, 2.4 cm non-mass enhancement: Biopsy: DCIS   Treatment plan: Neoadjuvant chemotherapy with [facility] Perjeta  followed by maintenance therapy based on pathologic response Breast conserving surgery (1 versus double lumpectomies) with targeted node dissection Adjuvant radiation therapy ----------------------------------------------------------------------------------------------------------------------------------------------- Current treatment: Completed 6 cycles of TCHP 01/12/2023-04/26/2023, Herceptin  and Perjeta  maintenance   Breast MRI 04/27/2023: Positive imaging response to neoadjuvant chemo: No enhancing mass at the biopsy site.  Decrease in size of previous prominent right axillary lymph nodes Follow-up in 3 weeks for Herceptin  Perjeta  maintenance. Further systemic therapy after that would be determined by surgical results.

## 2023-05-17 NOTE — Progress Notes (Signed)
 Kanjinti  diluted in NS220mL, Lot:  [id number],  Exp: 08/15/2024  Elodia Hailstone, RPH, BCPS, BCOP 05/17/2023 12:08 PM

## 2023-05-17 NOTE — Progress Notes (Signed)
 Patient Care Team: Danella Dunn, MD as PCP - General (Family Medicine) Alane Hsu, RN as Oncology Nurse Navigator Mark Sil, Linde Reveal, RN as Oncology Nurse Navigator Retta Caster, MD as Consulting Physician (Radiation Oncology) Cameron Cea, MD as Consulting Physician (Hematology and Oncology) Sim Dryer, MD as Consulting Physician (General Surgery)  DIAGNOSIS:  Encounter Diagnosis  Name Primary?   Malignant neoplasm of upper-outer quadrant of right breast in female, estrogen receptor negative (HCC) Yes    SUMMARY OF ONCOLOGIC HISTORY: Oncology History  Malignant neoplasm of upper-outer quadrant of right breast in female, estrogen receptor negative (HCC)  12/18/2022 Initial Diagnosis   Palpable right breast lump (irregular density) by ultrasound 1.3 cm at 9:30 position, ultrasound-guided biopsy: Grade 2 IDC no LVI, ER 0%, PR 0%, Ki67 30%, HER2 3+ positive, 1 axillary lymph node with cortical thickening biopsy: Positive   12/27/2022 Cancer Staging   Staging form: Breast, AJCC 8th Edition - Clinical: Stage IIA (cT1c, cN1, cM0, G2, ER-, PR-, HER2+) - Signed by Cameron Cea, MD on 12/27/2022 Stage prefix: Initial diagnosis Histologic grading system: 3 grade system   01/11/2023 Genetic Testing   Negative [facility] CancerNext +RNAinsight Panel.  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 Report date is 01/11/2023.   The [facility] CancerNext+RNAinsight Panel includes sequencing, rearrangement analysis, and RNA analysis for the following 39 genes: APC, ATM, BAP1, BARD1, BMPR1A, BRCA1, BRCA2, BRIP1, CDH1, CDKN2A, CHEK2, FH, FLCN, MET, MLH1, MSH2, MSH6, MUTYH, NF1, NTHL1, PALB2, PMS2, PTEN, RAD51C, RAD51D, SMAD4, STK11, TP53, TSC1, TSC2, and VHL (sequencing and deletion/duplication); AXIN2, HOXB13, MBD4, MSH3, POLD1 and POLE (sequencing only); EPCAM and GREM1 (deletion/duplication only).    01/12/2023 -  Chemotherapy   Patient is on Treatment Plan : BREAST  Docetaxel  + Carboplatin  + Trastuzumab  +  Pertuzumab   (TCHP) q21d        CHIEF COMPLIANT: F/U on Herceptin  and Perjeta   HISTORY OF PRESENT ILLNESS:   History of Present Illness Kara Webb is a 43 year old female with HER2-positive breast cancer who presents for follow-up on Herceptin  and Perjeta  maintenance therapy.  She is on Herceptin  and Perjeta  maintenance therapy every three weeks after completing chemotherapy for HER2-positive breast cancer. She plans to continue this regimen for a full year and is considering switching to injection form post-surgery, depending on results.  Her chemotherapy was completed successfully with no issues in blood return. Current blood counts show a white blood cell count of 2.0 and neutrophils at 0.7, without fever or other symptoms. Hemoglobin is stable at 10.1.  She experiences occasional loose stools managed with Imodium and has no taste changes. Her last echocardiogram on April 9th was normal, with another scheduled in three months.     ALLERGIES:  is allergic to [facility]  dimeglumine].  MEDICATIONS:  Current Outpatient Medications  Medication Sig Dispense Refill   ALPRAZolam  (XANAX ) 0.5 MG tablet Take 1 tablet (0.5 mg total) by mouth at bedtime as needed for anxiety or sleep. 20 tablet 0   azithromycin  (ZITHROMAX  Z-PAK) 250 MG tablet Per instructions. Only take if symptoms worsen and flu/covid negative, notify cancer center prior to starting 6 each 0   dexamethasone  (DECADRON ) 4 MG tablet Take 2 tabs by mouth 2 times daily starting day before chemo. Then take 2 tabs daily for 2 days starting day after chemo. Take with food. 30 tablet 1   ferrous gluconate  (FERGON) 324 MG tablet Take 1 tablet (324 mg total) by mouth 2 (two) times daily with a meal. 60 tablet 6  IUD'S IU IUD's     lidocaine -prilocaine  (EMLA ) cream Apply to affected area once 30 g 3   ondansetron  (ZOFRAN ) 8 MG tablet Take 1 tablet (8 mg total) by mouth every 8 (eight) hours as needed for nausea or vomiting.  Start on the third day after chemotherapy. (Patient not taking: Reported on 04/26/2023) 30 tablet 1   prochlorperazine  (COMPAZINE ) 10 MG tablet Take 1 tablet (10 mg total) by mouth every 6 (six) hours as needed for nausea or vomiting. (Patient not taking: Reported on 04/26/2023) 30 tablet 1   venlafaxine  XR (EFFEXOR -XR) 37.5 MG 24 hr capsule Take 1 capsule (37.5 mg total) by mouth daily with breakfast. 30 capsule 1   No current facility-administered medications for this visit.    PHYSICAL EXAMINATION: ECOG PERFORMANCE STATUS: 1 - Symptomatic but completely ambulatory  Vitals:   05/17/23 1053  BP: 134/82  Pulse: 96  Resp: 17  Temp: 98 F (36.7 C)  SpO2: 100%   Filed Weights   05/17/23 1053  Weight: 280 lb 6.4 oz (127.2 kg)    Physical Exam   (exam performed in the presence of a chaperone)  LABORATORY DATA:  I have reviewed the data as listed    Latest Ref Rng & Units 04/26/2023    9:54 AM 04/05/2023    7:42 AM 03/15/2023    9:53 AM  CMP  Glucose 70 - 99 mg/dL 161  096  045   BUN 6 - 20 mg/dL 17  14  12    Creatinine 0.44 - 1.00 mg/dL 4.09  8.11  9.14   Sodium 135 - 145 mmol/L 137  138  141   Potassium 3.5 - 5.1 mmol/L 4.1  3.6  3.8   Chloride 98 - 111 mmol/L 106  106  108   CO2 22 - 32 mmol/L 27  26  30    Calcium 8.9 - 10.3 mg/dL 9.8  9.7  9.3   Total Protein 6.5 - 8.1 g/dL 7.7  7.5  7.1   Total Bilirubin 0.0 - 1.2 mg/dL 0.2  0.2  0.2   Alkaline Phos 38 - 126 U/L 114  81  71   AST 15 - 41 U/L 66  25  16   ALT 0 - 44 U/L 153  50  19     Lab Results  Component Value Date   WBC 2.0 (L) 05/17/2023   HGB 10.1 (L) 05/17/2023   HCT 32.0 (L) 05/17/2023   MCV 84.4 05/17/2023   PLT 236 05/17/2023   NEUTROABS 0.7 (L) 05/17/2023    ASSESSMENT & PLAN:  Malignant neoplasm of upper-outer quadrant of right breast in female, estrogen receptor negative (HCC) 12/18/2022:Palpable right breast lump (irregular density) by ultrasound 1.3 cm at 9:30 position, ultrasound-guided biopsy:  Grade 2 IDC no LVI, ER 0%, PR 0%, Ki67 30%, HER2 3+ positive, 1 axillary lymph node with cortical thickening biopsy: Positive    Breast MRI 01/08/2023: 1.6 cm mass, 2.4 cm non-mass enhancement: Biopsy: DCIS   Treatment plan: Neoadjuvant chemotherapy with TCH Perjeta  followed by maintenance therapy based on pathologic response Breast conserving surgery (1 versus double lumpectomies) with targeted node dissection Adjuvant radiation therapy ----------------------------------------------------------------------------------------------------------------------------------------------- Current treatment: Completed 6 cycles of TCHP 01/12/2023-04/26/2023, Herceptin  and Perjeta  maintenance   Breast MRI 04/27/2023: Positive imaging response to neoadjuvant chemo: No enhancing mass at the biopsy site.  Decrease in size of previous prominent right axillary lymph nodes Follow-up in 3 weeks for Herceptin  Perjeta  maintenance. Further systemic therapy after that would be  determined by surgical results. ------------------------------------- Assessment and Plan Assessment & Plan Malignant neoplasm of upper-outer quadrant of right breast On Herceptin  and Perjeta  maintenance therapy targeting HER2 receptors. Transitioning to maintenance therapy every three weeks for one year. Scheduled for surgery with treatment plan re-evaluation post-surgery. Low blood counts expected to recover before surgery. Tolerating treatment well. IV preferred over injection due to bruising. - Continue Herceptin  and Perjeta  maintenance therapy every three weeks. - Reassess treatment plan post-surgery based on surgical results. - Consider switching to injection form if surgical results are favorable and insurance approval is obtained. - Monitor blood counts. - Schedule echocardiogram every three months, next due in July. - Postpone next treatment to May 29th to allow recovery time post-surgery.      No orders of the defined types were  placed in this encounter.  The patient has a good understanding of the overall plan. she agrees with it. she will call with any problems that may develop before the next visit here. Total time spent: 30 mins including face to face time and time spent for planning, charting and co-ordination of care   Viinay K Pluma Diniz, MD 05/17/23

## 2023-05-17 NOTE — Patient Instructions (Signed)
 [facility] - A DEPT OF [facility]  Discharge Instructions: Thank you for choosing [facility] to provide your oncology and hematology care.   If you have a lab appointment with the Cancer Center, please go directly to the Cancer Center and check in at the registration area.   Wear comfortable clothing and clothing appropriate for easy access to any Portacath or PICC line.   We strive to give you quality time with your provider. You may need to reschedule your appointment if you arrive late (15 or more minutes).  Arriving late affects you and other patients whose appointments are after yours.  Also, if you miss three or more appointments without notifying the office, you may be dismissed from the clinic at the provider's discretion.      For prescription refill requests, have your pharmacy contact our office and allow 72 hours for refills to be completed.    Today you received the following chemotherapy and/or immunotherapy agents: trastuzumab  and pertuzumab       To help prevent nausea and vomiting after your treatment, we encourage you to take your nausea medication as directed.  BELOW ARE SYMPTOMS THAT SHOULD BE REPORTED IMMEDIATELY: *FEVER GREATER THAN 100.4 F (38 C) OR HIGHER *CHILLS OR SWEATING *NAUSEA AND VOMITING THAT IS NOT CONTROLLED WITH YOUR NAUSEA MEDICATION *UNUSUAL SHORTNESS OF BREATH *UNUSUAL BRUISING OR BLEEDING *URINARY PROBLEMS (pain or burning when urinating, or frequent urination) *BOWEL PROBLEMS (unusual diarrhea, constipation, pain near the anus) TENDERNESS IN MOUTH AND THROAT WITH OR WITHOUT PRESENCE OF ULCERS (sore throat, sores in mouth, or a toothache) UNUSUAL RASH, SWELLING OR PAIN  UNUSUAL VAGINAL DISCHARGE OR ITCHING   Items with * indicate a potential emergency and should be followed up as soon as possible or go to the Emergency Department if any problems should occur.  Please show the CHEMOTHERAPY ALERT CARD  or IMMUNOTHERAPY ALERT CARD at check-in to the Emergency Department and triage nurse.  Should you have questions after your visit or need to cancel or reschedule your appointment, please contact [facility] - A DEPT OF [facility]  Dept: [phone]  and follow the prompts.  Office hours are 8:00 a.m. to 4:30 p.m. Monday - Friday. Please note that voicemails left after 4:00 p.m. may not be returned until the following business day.  We are closed weekends and major holidays. You have access to a nurse at all times for urgent questions. Please call the main number to the clinic Dept: [phone] and follow the prompts.   For any non-urgent questions, you may also contact your provider using MyChart. We now offer e-Visits for anyone 30 and older to request care online for non-urgent symptoms. For details visit [email or web address].   Also download the MyChart app! Go to the app store, search "MyChart", open the app, select [facility], and log in with your MyChart username and password.

## 2023-05-19 ENCOUNTER — Other Ambulatory Visit: Payer: Self-pay

## 2023-05-25 ENCOUNTER — Encounter (HOSPITAL_COMMUNITY): Payer: Self-pay

## 2023-05-28 ENCOUNTER — Other Ambulatory Visit: Payer: Self-pay

## 2023-05-28 ENCOUNTER — Encounter (HOSPITAL_BASED_OUTPATIENT_CLINIC_OR_DEPARTMENT_OTHER): Payer: Self-pay | Admitting: Surgery

## 2023-05-30 ENCOUNTER — Other Ambulatory Visit: Payer: Self-pay

## 2023-05-31 ENCOUNTER — Encounter: Payer: Self-pay | Admitting: General Practice

## 2023-05-31 NOTE — Progress Notes (Signed)
 [facility] Spiritual Care Note  Returned call from [facility] to process recent emotional episodes related to encountering the reality of her diagnosis and treatment. Assured her that her feelings and experiences are common among people in situations like hers, helping to reframed the tearful moments, for example, as "processing along the way" instead of holding everything in until the treatment period is "over." We likened this to present vs deferred grief.  Provided empathic listening, emotional support, normalization of feelings, constructive reframing, and affirmation of strengths. Yvonne plans to phone again as needed/desired, particularly as she prepares to ring the bell later in her treatment process.   [address] Kara Webb, [place], [facility] Pager [phone] Voicemail [phone]

## 2023-06-01 ENCOUNTER — Ambulatory Visit
Admission: RE | Admit: 2023-06-01 | Discharge: 2023-06-01 | Disposition: A | Discharge status: Home / Self Care | Source: Ambulatory Visit | Attending: Surgery | Admitting: Surgery

## 2023-06-01 DIAGNOSIS — C50911 Malignant neoplasm of unspecified site of right female breast: Secondary | ICD-10-CM

## 2023-06-01 DIAGNOSIS — C773 Secondary and unspecified malignant neoplasm of axilla and upper limb lymph nodes: Secondary | ICD-10-CM | POA: Diagnosis not present

## 2023-06-01 HISTORY — PX: BREAST BIOPSY: SHX20

## 2023-06-01 NOTE — H&P (Signed)
 History of Present Illness: Kara Webb is a 43 y.o. 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 She is doing well with chemotherapy. She is almost done. She is here today to discuss surgery. She is eager to proceed with breast conserving surgery. We discussed all of her options. She has no complaints of pain or mass. She has tolerated chemotherapy quite well she states..    Review of Systems: A complete review of systems was obtained from the patient. I have reviewed this information and discussed as appropriate with the patient. See HPI as well for other ROS.    Medical History: Past Medical History:  Diagnosis Date  Anxiety  History of cancer   Patient Active Problem List  Diagnosis  Malignant neoplasm of upper-outer quadrant of right breast in female, estrogen receptor negative (CMS/HHS-HCC)   Past Surgical History:  Procedure Laterality Date  .Breast Biopsy Right 12/18/2022  Toe Surgery  2004    No Known Allergies  Current Outpatient Medications on File Prior to Visit  Medication Sig Dispense Refill  ALPRAZolam  (XANAX ) 0.5 MG tablet Take 0.5 mg by mouth  dexAMETHasone  (DECADRON ) 4 MG tablet Take 2 tabs by mouth 2 times daily starting day before chemo. Then take 2 tabs daily for 2 days starting day after chemo. Take with food.  ferrous gluconate  (FERGON) 324 MG tablet Take 324 mg by mouth  lidocaine -prilocaine  (EMLA ) cream Apply to affected area once  ondansetron  (ZOFRAN ) 8 MG tablet Take 8 mg by mouth  venlafaxine  (EFFEXOR -XR) 37.5 MG XR capsule Take 37.5 mg by mouth   No current facility-administered medications on file prior to visit.   Family History  Problem Relation Age of Onset  Hyperlipidemia (Elevated cholesterol) Mother    Social History   Tobacco Use  Smoking Status Never  Smokeless Tobacco Never    Social History   Socioeconomic History  Marital status: Single  Tobacco Use  Smoking status:  Never  Smokeless tobacco: Never  Vaping Use  Vaping status: Never Used  Substance and Sexual Activity  Alcohol use: Yes  Drug use: Never   Social Drivers of Health   Food Insecurity: No Food Insecurity (12/27/2022)  Received from [facility]  Hunger Vital Sign  Worried About Running Out of Food in the Last Year: Never true  Ran Out of Food in the Last Year: Never true  Transportation Needs: No Transportation Needs (12/27/2022)  Received from [facility]  Lack of Transportation (Medical): No  Lack of Transportation (Non-Medical): No  Housing Stability: Unknown (05/07/2023)  Housing Stability Vital Sign  Homeless in the Last Year: No   Objective:   Vitals:  05/07/23 0939  PainSc: 0-No pain  PainLoc: Breast   There is no height or weight on file to calculate BMI.  Physical Exam Exam conducted with a chaperone present.  Cardiovascular:  Rate and Rhythm: Normal rate.  Pulmonary:  Effort: Pulmonary effort is normal.  Chest:  Breasts: Right: Normal.  Left: Normal.   Comments: Port in place Musculoskeletal:  General: Normal range of motion.  Cervical back: Normal range of motion.  Lymphadenopathy:  Upper Body:  Right upper body: No supraclavicular or axillary adenopathy.  Left upper body: No supraclavicular or axillary adenopathy.  Skin: General: Skin is warm.  Neurological:  General: No focal deficit present.  Mental Status: She is alert.  Psychiatric:  Mood and Affect: Mood normal.     Labs, Imaging and Diagnostic Testing:  CLINICAL DATA: History of biopsy-proven invasive ductal carcinoma 9:30 position right breast diagnosed 12/18/2022 with subsequent biopsy-proven DCIS by MRI guided biopsy 01/19/2023 more superiorly in the upper-outer right breast. Patient is post neoadjuvant chemotherapy.  EXAM: BILATERAL BREAST MRI WITH AND WITHOUT CONTRAST  TECHNIQUE: Multiplanar, multisequence MR images of both breasts were  obtained prior to and following the intravenous administration of 10 ml of Vueway .  Three-dimensional MR images were rendered by post-processing of the original MR data on an independent workstation. The three-dimensional MR images were interpreted, and findings are reported in the following complete MRI report for this study. Three dimensional images were evaluated at the independent interpreting workstation using the DynaCAD thin client.  COMPARISON: Previous exam(s).  FINDINGS: Breast composition: b. Scattered fibroglandular tissue.  Background parenchymal enhancement: Mild.  Right breast: Biopsy clip artifact over the biopsy-proven invasive ductal carcinoma in the outer mid to upper right breast. No concerning mass or abnormal enhancement at this biopsy site. Biopsy clip artifact over the biopsy-proven DCIS in the more superior upper slightly outer right breast. There is subtle residual enhancement adjacent the biopsy clip which is significantly improved compared to December 2024. Remainder of the right breast is unchanged.  Left breast: No mass or abnormal enhancement.  Lymph nodes: Decrease in size of previously seen prominent right axillary lymph nodes. No new concerning axillary or other abnormal lymph nodes.  Ancillary findings: None.  IMPRESSION: Overall positive imaging response post neoadjuvant chemotherapy at patient's 2 sites of biopsy proven malignancy right upper outer quadrant. No new concerning findings within either breast.  RECOMMENDATION: Recommend continued management per clinical treatment plan.  BI-RADS CATEGORY 6: Known biopsy-proven malignancy.   Electronically Signed By: Roda Cirri M.D. On: 04/27/2023 13:11  Assessment and Plan:   Diagnoses and all orders for this visit:  Malignant neoplasm of upper-outer quadrant of right breast in female, estrogen receptor negative (CMS/HHS-HCC)    Reviewed breast conserving surgery, targeted  sentinel lymph node mapping, seed localized lymph node biopsy, and seed localized lumpectomy. She was to proceed with breast conserving surgery. I contrasted this with mastectomy. Risks and benefits of all options reviewed as well as long-term survival, local regional recurrence and quality of life. Discussed lymphedema as well with her. Risk of bleeding, infection, seroma, cosmetic deformity, drainage, pain, shoulder stiffness, numbness, injury to major vascular structures, injury to major nerves, anesthesia risk reviewed.  No follow-ups on file.  Sharlee Dawes, MD

## 2023-06-05 ENCOUNTER — Other Ambulatory Visit: Payer: Self-pay

## 2023-06-05 ENCOUNTER — Ambulatory Visit (HOSPITAL_BASED_OUTPATIENT_CLINIC_OR_DEPARTMENT_OTHER): Admitting: Anesthesiology

## 2023-06-05 ENCOUNTER — Encounter (HOSPITAL_BASED_OUTPATIENT_CLINIC_OR_DEPARTMENT_OTHER)
Admission: RE | Disposition: A | Discharge status: Home / Self Care | Payer: Self-pay | Source: Home / Self Care | Attending: Surgery

## 2023-06-05 ENCOUNTER — Ambulatory Visit
Admission: RE | Admit: 2023-06-05 | Discharge: 2023-06-05 | Disposition: A | Discharge status: Home / Self Care | Source: Ambulatory Visit | Attending: Surgery | Admitting: Surgery

## 2023-06-05 ENCOUNTER — Ambulatory Visit (HOSPITAL_BASED_OUTPATIENT_CLINIC_OR_DEPARTMENT_OTHER)
Admission: RE | Admit: 2023-06-05 | Discharge: 2023-06-05 | Disposition: A | Discharge status: Home / Self Care | Attending: Surgery | Admitting: Surgery

## 2023-06-05 ENCOUNTER — Encounter (HOSPITAL_BASED_OUTPATIENT_CLINIC_OR_DEPARTMENT_OTHER): Payer: Self-pay | Admitting: Surgery

## 2023-06-05 DIAGNOSIS — C50911 Malignant neoplasm of unspecified site of right female breast: Secondary | ICD-10-CM | POA: Diagnosis not present

## 2023-06-05 DIAGNOSIS — Z5111 Encounter for antineoplastic chemotherapy: Secondary | ICD-10-CM | POA: Insufficient documentation

## 2023-06-05 DIAGNOSIS — Z1732 Human epidermal growth factor receptor 2 negative status: Secondary | ICD-10-CM | POA: Insufficient documentation

## 2023-06-05 DIAGNOSIS — N61 Mastitis without abscess: Secondary | ICD-10-CM | POA: Diagnosis not present

## 2023-06-05 DIAGNOSIS — N6031 Fibrosclerosis of right breast: Secondary | ICD-10-CM | POA: Diagnosis not present

## 2023-06-05 DIAGNOSIS — C50411 Malignant neoplasm of upper-outer quadrant of right female breast: Secondary | ICD-10-CM | POA: Diagnosis not present

## 2023-06-05 DIAGNOSIS — C773 Secondary and unspecified malignant neoplasm of axilla and upper limb lymph nodes: Secondary | ICD-10-CM | POA: Diagnosis not present

## 2023-06-05 DIAGNOSIS — Z1722 Progesterone receptor negative status: Secondary | ICD-10-CM | POA: Insufficient documentation

## 2023-06-05 DIAGNOSIS — Z171 Estrogen receptor negative status [ER-]: Secondary | ICD-10-CM | POA: Diagnosis not present

## 2023-06-05 DIAGNOSIS — Z01818 Encounter for other preprocedural examination: Secondary | ICD-10-CM

## 2023-06-05 DIAGNOSIS — G8918 Other acute postprocedural pain: Secondary | ICD-10-CM | POA: Diagnosis not present

## 2023-06-05 HISTORY — PX: BREAST LUMPECTOMY WITH RADIOACTIVE SEED AND SENTINEL LYMPH NODE BIOPSY: SHX6550

## 2023-06-05 LAB — POCT PREGNANCY, URINE: Preg Test, Ur: NEGATIVE

## 2023-06-05 SURGERY — BREAST LUMPECTOMY WITH RADIOACTIVE SEED AND SENTINEL LYMPH NODE BIOPSY
Anesthesia: Regional | Site: Breast | Laterality: Right

## 2023-06-05 MED ORDER — DEXAMETHASONE SODIUM PHOSPHATE 10 MG/ML IJ SOLN
INTRAMUSCULAR | Status: AC
Start: 2023-06-05 — End: ?
  Filled 2023-06-05: qty 1

## 2023-06-05 MED ORDER — ACETAMINOPHEN 500 MG PO TABS
ORAL_TABLET | ORAL | Status: AC
  Filled 2023-06-05: qty 2

## 2023-06-05 MED ORDER — ONDANSETRON HCL 4 MG/2ML IJ SOLN
INTRAMUSCULAR | Status: DC | PRN
  Administered 2023-06-05: 4 mg via INTRAVENOUS

## 2023-06-05 MED ORDER — BUPIVACAINE-EPINEPHRINE (PF) 0.5% -1:200000 IJ SOLN
INTRAMUSCULAR | Status: DC | PRN
  Administered 2023-06-05: 30 mL via PERINEURAL

## 2023-06-05 MED ORDER — AMISULPRIDE (ANTIEMETIC) 5 MG/2ML IV SOLN: 10.0000 mg | Freq: Once | INTRAVENOUS | Status: DC | PRN

## 2023-06-05 MED ORDER — KETOROLAC TROMETHAMINE 30 MG/ML IJ SOLN
30.0000 mg | Freq: Once | INTRAMUSCULAR | Status: AC | PRN
  Administered 2023-06-05: 30 mg via INTRAVENOUS

## 2023-06-05 MED ORDER — MIDAZOLAM HCL 2 MG/2ML IJ SOLN
INTRAMUSCULAR | Status: AC
  Filled 2023-06-05: qty 2

## 2023-06-05 MED ORDER — FENTANYL CITRATE (PF) 100 MCG/2ML IJ SOLN
INTRAMUSCULAR | Status: AC
Start: 2023-06-05 — End: ?
  Filled 2023-06-05: qty 2

## 2023-06-05 MED ORDER — FENTANYL CITRATE (PF) 100 MCG/2ML IJ SOLN
INTRAMUSCULAR | Status: DC | PRN
  Administered 2023-06-05 (×2): 25 ug via INTRAVENOUS
  Administered 2023-06-05: 50 ug via INTRAVENOUS
  Administered 2023-06-05 (×3): 25 ug via INTRAVENOUS
  Administered 2023-06-05: 50 ug via INTRAVENOUS
  Administered 2023-06-05: 25 ug via INTRAVENOUS

## 2023-06-05 MED ORDER — LACTATED RINGERS IV SOLN: INTRAVENOUS | Status: DC

## 2023-06-05 MED ORDER — OXYCODONE HCL 5 MG/5ML PO SOLN: 5.0000 mg | Freq: Once | ORAL | Status: DC | PRN

## 2023-06-05 MED ORDER — MAGTRACE LYMPHATIC TRACER
INTRAMUSCULAR | Status: DC | PRN
  Administered 2023-06-05: 2 mL via INTRAMUSCULAR

## 2023-06-05 MED ORDER — IBUPROFEN 800 MG PO TABS: 800.0000 mg | ORAL_TABLET | Freq: Three times a day (TID) | ORAL | 0 refills | Status: AC | PRN

## 2023-06-05 MED ORDER — LIDOCAINE 2% (20 MG/ML) 5 ML SYRINGE
INTRAMUSCULAR | Status: AC
  Filled 2023-06-05: qty 5

## 2023-06-05 MED ORDER — BUPIVACAINE-EPINEPHRINE (PF) 0.25% -1:200000 IJ SOLN
INTRAMUSCULAR | Status: AC
  Filled 2023-06-05: qty 30

## 2023-06-05 MED ORDER — FENTANYL CITRATE (PF) 100 MCG/2ML IJ SOLN
100.0000 ug | Freq: Once | INTRAMUSCULAR | Status: AC
  Administered 2023-06-05: 100 ug via INTRAVENOUS

## 2023-06-05 MED ORDER — FENTANYL CITRATE (PF) 100 MCG/2ML IJ SOLN
INTRAMUSCULAR | Status: AC
  Filled 2023-06-05: qty 2

## 2023-06-05 MED ORDER — DEXAMETHASONE SODIUM PHOSPHATE 10 MG/ML IJ SOLN
INTRAMUSCULAR | Status: DC | PRN
Start: 2023-06-05 — End: 2023-06-05
  Administered 2023-06-05: 10 mg via INTRAVENOUS

## 2023-06-05 MED ORDER — BUPIVACAINE-EPINEPHRINE (PF) 0.25% -1:200000 IJ SOLN
INTRAMUSCULAR | Status: DC | PRN
  Administered 2023-06-05: 19 mL via PERINEURAL

## 2023-06-05 MED ORDER — CEFAZOLIN SODIUM-DEXTROSE 3-4 GM/150ML-% IV SOLN
3.0000 g | INTRAVENOUS | Status: AC
  Administered 2023-06-05: 3 g via INTRAVENOUS

## 2023-06-05 MED ORDER — CHLORHEXIDINE GLUCONATE CLOTH 2 % EX PADS: 6.0000 | MEDICATED_PAD | Freq: Once | CUTANEOUS | Status: DC

## 2023-06-05 MED ORDER — MIDAZOLAM HCL 2 MG/2ML IJ SOLN
2.0000 mg | Freq: Once | INTRAMUSCULAR | Status: AC
  Administered 2023-06-05: 2 mg via INTRAVENOUS

## 2023-06-05 MED ORDER — ONDANSETRON HCL 4 MG/2ML IJ SOLN
INTRAMUSCULAR | Status: AC
  Filled 2023-06-05: qty 2

## 2023-06-05 MED ORDER — OXYCODONE HCL 5 MG PO TABS: 5.0000 mg | ORAL_TABLET | Freq: Once | ORAL | Status: DC | PRN

## 2023-06-05 MED ORDER — KETOROLAC TROMETHAMINE 30 MG/ML IJ SOLN
INTRAMUSCULAR | Status: AC
Start: 2023-06-05 — End: ?
  Filled 2023-06-05: qty 1

## 2023-06-05 MED ORDER — OXYCODONE HCL 5 MG PO TABS: 5.0000 mg | ORAL_TABLET | Freq: Four times a day (QID) | ORAL | 0 refills | Status: DC | PRN

## 2023-06-05 MED ORDER — METHYLENE BLUE (ANTIDOTE) 1 % IV SOLN
INTRAVENOUS | Status: AC
  Filled 2023-06-05: qty 10

## 2023-06-05 MED ORDER — PROPOFOL 500 MG/50ML IV EMUL
INTRAVENOUS | Status: DC | PRN
  Administered 2023-06-05: 150 ug/kg/min via INTRAVENOUS
  Administered 2023-06-05: 175 ug/kg/min via INTRAVENOUS

## 2023-06-05 MED ORDER — PROPOFOL 10 MG/ML IV BOLUS
INTRAVENOUS | Status: DC | PRN
  Administered 2023-06-05: 30 mg via INTRAVENOUS
  Administered 2023-06-05: 50 mg via INTRAVENOUS
  Administered 2023-06-05: 200 mg via INTRAVENOUS
  Administered 2023-06-05: 50 mg via INTRAVENOUS

## 2023-06-05 MED ORDER — FENTANYL CITRATE (PF) 100 MCG/2ML IJ SOLN
25.0000 ug | INTRAMUSCULAR | Status: DC | PRN
Start: 2023-06-05 — End: 2023-06-05
  Administered 2023-06-05: 50 ug via INTRAVENOUS

## 2023-06-05 MED ORDER — ACETAMINOPHEN 500 MG PO TABS
1000.0000 mg | ORAL_TABLET | ORAL | Status: AC
  Administered 2023-06-05: 1000 mg via ORAL

## 2023-06-05 MED ORDER — PROPOFOL 500 MG/50ML IV EMUL
INTRAVENOUS | Status: AC
  Filled 2023-06-05: qty 50

## 2023-06-05 MED ORDER — CEFAZOLIN SODIUM-DEXTROSE 3-4 GM/150ML-% IV SOLN
INTRAVENOUS | Status: AC
  Filled 2023-06-05: qty 150

## 2023-06-05 MED ORDER — LIDOCAINE 2% (20 MG/ML) 5 ML SYRINGE
INTRAMUSCULAR | Status: DC | PRN
  Administered 2023-06-05: 40 mg via INTRAVENOUS

## 2023-06-05 MED ORDER — HEMOSTATIC AGENTS (NO CHARGE) OPTIME
TOPICAL | Status: DC | PRN
  Administered 2023-06-05: 1 via TOPICAL

## 2023-06-05 SURGICAL SUPPLY — 43 items
BINDER BREAST LRG (GAUZE/BANDAGES/DRESSINGS) ×1 IMPLANT
BINDER BREAST MEDIUM (GAUZE/BANDAGES/DRESSINGS) IMPLANT
BINDER BREAST XLRG (GAUZE/BANDAGES/DRESSINGS) IMPLANT
BINDER BREAST XXLRG (GAUZE/BANDAGES/DRESSINGS) ×1 IMPLANT
BLADE SURG 15 STRL LF DISP TIS (BLADE) ×3 IMPLANT
CANISTER SUC SOCK COL 7IN (MISCELLANEOUS) IMPLANT
CANISTER SUCT 1200ML W/VALVE (MISCELLANEOUS) ×3 IMPLANT
CHLORAPREP W/TINT 26 (MISCELLANEOUS) ×3 IMPLANT
CLIP APPLIE 9.375 MED OPEN (MISCELLANEOUS) ×3 IMPLANT
COVER BACK TABLE 60X90IN (DRAPES) ×3 IMPLANT
COVER MAYO STAND STRL (DRAPES) ×3 IMPLANT
COVER PROBE CYLINDRICAL 5X96 (MISCELLANEOUS) ×4 IMPLANT
DERMABOND ADVANCED .7 DNX12 (GAUZE/BANDAGES/DRESSINGS) ×4 IMPLANT
DRAPE LAPAROSCOPIC ABDOMINAL (DRAPES) ×3 IMPLANT
DRAPE LAPAROTOMY 100X72 PEDS (DRAPES) ×3 IMPLANT
DRAPE UTILITY XL STRL (DRAPES) ×3 IMPLANT
ELECT COATED BLADE 2.86 ST (ELECTRODE) ×3 IMPLANT
ELECTRODE REM PT RTRN 9FT ADLT (ELECTROSURGICAL) ×3 IMPLANT
GLOVE BIOGEL PI IND STRL 8 (GLOVE) ×3 IMPLANT
GLOVE ECLIPSE 8.0 STRL XLNG CF (GLOVE) ×3 IMPLANT
GOWN STRL REUS W/ TWL LRG LVL3 (GOWN DISPOSABLE) ×6 IMPLANT
GOWN STRL REUS W/ TWL XL LVL3 (GOWN DISPOSABLE) ×3 IMPLANT
HEMOSTAT ARISTA ABSORB 3G PWDR (HEMOSTASIS) ×1 IMPLANT
HEMOSTAT SNOW SURGICEL 2X4 (HEMOSTASIS) IMPLANT
KIT MARKER MARGIN INK (KITS) ×3 IMPLANT
NDL HYPO 25X1 1.5 SAFETY (NEEDLE) ×2 IMPLANT
NDL SAFETY ECLIPSE 18X1.5 (NEEDLE) IMPLANT
NEEDLE HYPO 25X1 1.5 SAFETY (NEEDLE) ×2 IMPLANT
NS IRRIG 1000ML POUR BTL (IV SOLUTION) ×3 IMPLANT
PACK BASIN DAY SURGERY FS (CUSTOM PROCEDURE TRAY) ×3 IMPLANT
PENCIL SMOKE EVACUATOR (MISCELLANEOUS) ×3 IMPLANT
SLEEVE SCD COMPRESS KNEE MED (STOCKING) ×3 IMPLANT
SPIKE FLUID TRANSFER (MISCELLANEOUS) IMPLANT
SPONGE T-LAP 4X18 ~~LOC~~+RFID (SPONGE) ×4 IMPLANT
SUT MNCRL AB 4-0 PS2 18 (SUTURE) ×4 IMPLANT
SUT SILK 2 0 SH (SUTURE) IMPLANT
SUT VICRYL 3-0 CR8 SH (SUTURE) ×4 IMPLANT
SYR CONTROL 10ML LL (SYRINGE) ×3 IMPLANT
TOWEL GREEN STERILE FF (TOWEL DISPOSABLE) ×3 IMPLANT
TRACER MAGTRACE VIAL (MISCELLANEOUS) ×1 IMPLANT
TRAY FAXITRON CT DISP (TRAY / TRAY PROCEDURE) ×3 IMPLANT
TUBE CONNECTING 20X1/4 (TUBING) ×3 IMPLANT
YANKAUER SUCT BULB TIP NO VENT (SUCTIONS) ×3 IMPLANT

## 2023-06-05 NOTE — Anesthesia Preprocedure Evaluation (Addendum)
 Anesthesia Evaluation  Patient identified by MRN, date of birth, ID band Patient awake    Reviewed: Allergy & Precautions, NPO status , Patient's Chart, lab work & pertinent test results  Airway Mallampati: II  TM Distance: >3 FB Neck ROM: Full    Dental no notable dental hx.    Pulmonary neg pulmonary ROS   Pulmonary exam normal        Cardiovascular negative cardio ROS Normal cardiovascular exam     Neuro/Psych  PSYCHIATRIC DISORDERS      negative neurological ROS     GI/Hepatic negative GI ROS, Neg liver ROS,,,  Endo/Other    Class 3 obesity  Renal/GU negative Renal ROS     Musculoskeletal negative musculoskeletal ROS (+)    Abdominal  (+) + obese  Peds  Hematology negative hematology ROS (+)   Anesthesia Other Findings RIGHT BREAST CANCER  Reproductive/Obstetrics Hcg negative                             Anesthesia Physical Anesthesia Plan  ASA: 3  Anesthesia Plan: General and Regional   Post-op Pain Management: Regional block*   Induction: Intravenous  PONV Risk Score and Plan: 3 and Ondansetron , Dexamethasone , Midazolam , Treatment may vary due to age or medical condition and Propofol  infusion  Airway Management Planned: LMA  Additional Equipment:   Intra-op Plan:   Post-operative Plan: Extubation in OR  Informed Consent: I have reviewed the patients History and Physical, chart, labs and discussed the procedure including the risks, benefits and alternatives for the proposed anesthesia with the patient or authorized representative who has indicated his/her understanding and acceptance.     Dental advisory given  Plan Discussed with: CRNA  Anesthesia Plan Comments:        Anesthesia Quick Evaluation

## 2023-06-05 NOTE — Anesthesia Postprocedure Evaluation (Signed)
 Anesthesia Post Note  Patient: Kara Webb  Procedure(s) Performed: RIGHT BREAST SEED LUMPECTOMY x2, RIGHT SEED TARGETED LYMPH NODE BIOPSY, RIGHT SENTINEL LYMPH NODE MAPPING (Right: Breast)     Patient location during evaluation: PACU Anesthesia Type: Regional and General Level of consciousness: awake Pain management: pain level controlled Vital Signs Assessment: post-procedure vital signs reviewed and stable Respiratory status: spontaneous breathing, nonlabored ventilation and respiratory function stable Cardiovascular status: blood pressure returned to baseline and stable Postop Assessment: no apparent nausea or vomiting Anesthetic complications: no   No notable events documented.  Last Vitals:  Vitals:   06/05/23 0956 06/05/23 1008  BP:  (!) 121/91  Pulse: 73 69  Resp: 19 18  Temp:  (!) 36.2 C  SpO2: 96% 95%    Last Pain:  Vitals:   06/05/23 1008  TempSrc:   PainSc: 3                  Ephraim Reichel P Winola Drum

## 2023-06-05 NOTE — Discharge Instructions (Addendum)
 [facility] Office Phone Number [phone]  BREAST BIOPSY/ PARTIAL MASTECTOMY: POST OP INSTRUCTIONS  Always review your discharge instruction sheet given to you by the facility where your surgery was performed.  IF YOU HAVE DISABILITY OR FAMILY LEAVE FORMS, YOU MUST BRING THEM TO THE OFFICE FOR PROCESSING.  DO NOT GIVE THEM TO YOUR DOCTOR.  A prescription for pain medication may be given to you upon discharge.  Take your pain medication as prescribed, if needed.  If narcotic pain medicine is not needed, then you may take acetaminophen  (Tylenol ) or ibuprofen (Advil) as needed. Take your usually prescribed medications unless otherwise directed If you need a refill on your pain medication, please contact your pharmacy.  They will contact our office to request authorization.  Prescriptions will not be filled after 5pm or on week-ends. You should eat very light the first 24 hours after surgery, such as soup, crackers, pudding, etc.  Resume your normal diet the day after surgery. Most patients will experience some swelling and bruising in the breast.  Ice packs and a good support bra will help.  Swelling and bruising can take several days to resolve.  It is common to experience some constipation if taking pain medication after surgery.  Increasing fluid intake and taking a stool softener will usually help or prevent this problem from occurring.  A mild laxative (Milk of Magnesia or Miralax) should be taken according to package directions if there are no bowel movements after 48 hours. Unless discharge instructions indicate otherwise, you may remove your bandages 24-48 hours after surgery, and you may shower at that time.  You may have steri-strips (small skin tapes) in place directly over the incision.  These strips should be left on the skin for 7-10 days.  If your surgeon used skin glue on the incision, you may shower in 24 hours.  The glue will flake off over the next 2-3 weeks.  Any  sutures or staples will be removed at the office during your follow-up visit. ACTIVITIES:  You may resume regular daily activities (gradually increasing) beginning the next day.  Wearing a good support bra or sports bra minimizes pain and swelling.  You may have sexual intercourse when it is comfortable. You may drive when you no longer are taking prescription pain medication, you can comfortably wear a seatbelt, and you can safely maneuver your car and apply brakes. RETURN TO WORK:  ______________________________________________________________________________________ Kara Webb should see your doctor in the office for a follow-up appointment approximately two weeks after your surgery.  Your doctor's nurse will typically make your follow-up appointment when she calls you with your pathology report.  Expect your pathology report 2-3 business days after your surgery.  You may call to check if you do not hear from us  after three days. OTHER INSTRUCTIONS: _______________________________________________________________________________________________ _____________________________________________________________________________________________________________________________________ _____________________________________________________________________________________________________________________________________ _____________________________________________________________________________________________________________________________________  WHEN TO CALL YOUR DOCTOR: Fever over 101.0 Nausea and/or vomiting. Extreme swelling or bruising. Continued bleeding from incision. Increased pain, redness, or drainage from the incision.  The clinic staff is available to answer your questions during regular business hours.  Please don't hesitate to call and ask to speak to one of the nurses for clinical concerns.  If you have a medical emergency, go to the nearest emergency room or call 911.  A surgeon from [facility] is always on call at the hospital.  For further questions, please visit [email or web address]     Post Anesthesia Home Care Instructions  Activity: Get plenty of rest for the remainder  of the day. A responsible individual must stay with you for 24 hours following the procedure.  For the next 24 hours, DO NOT: -Drive a car -Advertising copywriter -Drink alcoholic beverages -Take any medication unless instructed by your physician -Make any legal decisions or sign important papers.  Meals: Start with liquid foods such as gelatin or soup. Progress to regular foods as tolerated. Avoid greasy, spicy, heavy foods. If nausea and/or vomiting occur, drink only clear liquids until the nausea and/or vomiting subsides. Call your physician if vomiting continues.  Special Instructions/Symptoms: Your throat may feel dry or sore from the anesthesia or the breathing tube placed in your throat during surgery. If this causes discomfort, gargle with warm salt water. The discomfort should disappear within 24 hours.  If you had a scopolamine patch placed behind your ear for the management of post- operative nausea and/or vomiting:  1. The medication in the patch is effective for 72 hours, after which it should be removed.  Wrap patch in a tissue and discard in the trash. Wash hands thoroughly with soap and water. 2. You may remove the patch earlier than 72 hours if you experience unpleasant side effects which may include dry mouth, dizziness or visual disturbances. 3. Avoid touching the patch. Wash your hands with soap and water after contact with the patch.     Regional Anesthesia Blocks  1. You may not be able to move or feel the "blocked" extremity after a regional anesthetic block. This may last may last from 3-48 hours after placement, but it will go away. The length of time depends on the medication injected and your individual response to the medication. As the nerves start to wake  up, you may experience tingling as the movement and feeling returns to your extremity. If the numbness and inability to move your extremity has not gone away after 48 hours, please call your surgeon.   2. The extremity that is blocked will need to be protected until the numbness is gone and the strength has returned. Because you cannot feel it, you will need to take extra care to avoid injury. Because it may be weak, you may have difficulty moving it or using it. You may not know what position it is in without looking at it while the block is in effect.  3. For blocks in the legs and feet, returning to weight bearing and walking needs to be done carefully. You will need to wait until the numbness is entirely gone and the strength has returned. You should be able to move your leg and foot normally before you try and bear weight or walk. You will need someone to be with you when you first try to ensure you do not fall and possibly risk injury.  4. Bruising and tenderness at the needle site are common side effects and will resolve in a few days.  5. Persistent numbness or new problems with movement should be communicated to the surgeon or the [facility] [phone] [facility] [phone]).  Tylenol  can be taken 12:50 pm if needed

## 2023-06-05 NOTE — Transfer of Care (Signed)
 Immediate Anesthesia Transfer of Care Note  Patient: Kara Webb  Procedure(s) Performed: RIGHT BREAST SEED LUMPECTOMY x2, RIGHT SEED TARGETED LYMPH NODE BIOPSY, RIGHT SENTINEL LYMPH NODE MAPPING (Right: Breast)  Patient Location: PACU  Anesthesia Type:GA combined with regional for post-op pain  Level of Consciousness: drowsy and patient cooperative  Airway & Oxygen Therapy: Patient Spontanous Breathing and Patient connected to face mask oxygen  Post-op Assessment: Report given to RN and Post -op Vital signs reviewed and stable  Post vital signs: Reviewed and stable  Last Vitals:  Vitals Value Taken Time  BP    Temp    Pulse 82 06/05/23 0903  Resp    SpO2 98 % 06/05/23 0903  Vitals shown include unfiled device data.  Last Pain:  Vitals:   06/05/23 0640  TempSrc: Temporal  PainSc: 0-No pain      Patients Stated Pain Goal: 5 (06/05/23 0640)  Complications: No notable events documented.

## 2023-06-05 NOTE — Anesthesia Procedure Notes (Signed)
 Anesthesia Regional Block: Pectoralis block   Pre-Anesthetic Checklist: , timeout performed,  Correct Patient, Correct Site, Correct Laterality,  Correct Procedure, Correct Position, site marked,  Risks and benefits discussed,  Surgical consent,  Pre-op evaluation,  At surgeon's request and post-op pain management  Laterality: Right  Prep: chloraprep       Needles:  Injection technique: Single-shot  Needle Type: Echogenic Stimulator Needle     Needle Length: 10cm  Needle Gauge: 20     Additional Needles:   Procedures:,,,, ultrasound used (permanent image in chart),,    Narrative:  Start time: 06/05/2023 7:00 AM End time: 06/05/2023 7:10 AM Injection made incrementally with aspirations every 5 mL.  Performed by: Personally  Anesthesiologist: Peggi Bowels, MD  Additional Notes: Functioning IV was confirmed and monitors were applied.  A timeout was performed. Sterile prep, hand hygiene and sterile gloves were used. A 20ga Bbraun echogenic stimulator needle was used. Negative aspiration and negative test dose prior to incremental administration of local anesthetic. The patient tolerated the procedure well.  Ultrasound guidance: relevent anatomy identified, needle position confirmed, local anesthetic spread visualized around nerve(s), vascular puncture avoided.  Image printed for medical record.

## 2023-06-05 NOTE — Anesthesia Procedure Notes (Signed)
 Procedure Name: LMA Insertion Date/Time: 06/05/2023 7:35 AM  Performed by: Leverne Reading, CRNAPre-anesthesia Checklist: Patient identified, Emergency Drugs available, Suction available and Patient being monitored Patient Re-evaluated:Patient Re-evaluated prior to induction Oxygen Delivery Method: Circle system utilized Preoxygenation: Pre-oxygenation with 100% oxygen Induction Type: IV induction Ventilation: Mask ventilation without difficulty LMA: LMA with gastric port inserted LMA Size: 4.0 Number of attempts: 1 Placement Confirmation: positive ETCO2 Tube secured with: Tape Dental Injury: Teeth and Oropharynx as per pre-operative assessment

## 2023-06-05 NOTE — Op Note (Signed)
 Preoperative diagnosis: Stage II right breast cancer upper outer quadrant ER negative  Postoperative diagnosis: Same  Procedure: Bracketed right breast seed localized lumpectomy with targeted right axillary deep lymph node biopsy with radioactive seed, right axillary sentinel lymph node mapping using mag trace  Surgeon: Sim Dryer, MD  Anesthesia: LMA with 0.25% Marcaine  with epinephrine  local and right pectoral block per anesthesia protocol  Drains: None  Specimen: Right breast tissue with 2 localizing seeds and 2 localizing clips in the breast specimen, targeted right axillary lymph node with seed and clip present, 3 additional right axillary deep sentinel nodes  EBL: 20 cc  Indications for procedure: The patient is a 43 year old female status post neoadjuvant chemotherapy for ER negative, PR negative, HER2/neu positive grade 3 stage II right breast cancer.  She is completed neoadjuvant chemotherapy with good response presents today for definitive surgical treatment.  All options were reviewed to include breast conserving surgery versus mastectomy with reconstruction.  Complications of each, long-term survival, local regional recurrence, quality life issues were all reviewed with the patient today.The procedure has been discussed with the patient. Alternatives to surgery have been discussed with the patient.  Risks of surgery include bleeding,  Infection,  Seroma formation, death,  and the need for further surgery.   The patient understands and wishes to proceed. Sentinel lymph node mapping and dissection has been discussed with the patient.  Risk of bleeding,  Infection,  Seroma formation,  Additional procedures,,  Shoulder weakness ,  Shoulder stiffness,  Nerve and blood vessel injury and reaction to the mapping dyes have been discussed.  Alternatives to surgery have been discussed with the patient.  The patient agrees to proceed.    Description of procedure: The patient was met in the  holding area and questions were answered.  Of note, the patient underwent seed localization of the right breast x 2 in the right axilla per radiology protocol.  The right side was marked as the correct site.  Pectoral block administered by anesthesia.  All questions were answered.  She was then taken to the operating room and placed supine upon the operating room table.  After induction of general anesthesia, 2 cc of mag trace were injected into the right breast under sterile conditions and massaged for 5 minutes.  The right breast was then prepped and draped and a second timeout was performed.  Neoprobe was used identify the seeds in the right breast upper outer quadrant.  A curvilinear incision was made of the signals.  Dissection was carried down all tissue around both seeds and both clips were excised as 1 specimen.  Imaging revealed both seeds and both clips to be present.  Of note the tissue was oriented with ink and sent to pathology.  The gross margins appeared normal.  The cavity was irrigated.  Made hemostatic with cautery.  Clips were used to mark the cavity and the deep cavities closed with 3-0 Vicryl.  Skin closed with 4 Monocryl.  Neoprobe was used to identify the seed in the right axilla.  A 4 cm incision was made along the inferior border of the axillary hairline.  Dissection was carried down into the deep level 1 contents of the right axilla.  The node was identified with the seed and clip and removed.  There were 3 additional nodes that it taken up the mag trace.  These had minor brownish discoloration as well.  These were all sent as 1 block specimen to pathology.  The background counts did  not show any significant spike indicating other nodes that taken up the mag trace.  Long thoracic nerve, thoracodorsal trunk and extra vein were all preserved.  Irrigation was used.  Hemostasis achieved with cautery and Arista with good hemostasis.  The deep tissue planes were approximated with 3-0 Vicryl.  4  Monocryl was used to close the skin in a subcuticular fashion.  Dermabond applied.  All counts found to be correct.  Breast binder placed.  The patient was awoke extubated taken to recovery in satisfactory condition.

## 2023-06-05 NOTE — Interval H&P Note (Signed)
 History and Physical Interval Note:  06/05/2023 7:13 AM  Kara Webb  has presented today for surgery, with the diagnosis of RIGHT BREAST CANCER.  The various methods of treatment have been discussed with the patient and family. After consideration of risks, benefits and other options for treatment, the patient has consented to  Procedure(s) with comments: BREAST LUMPECTOMY WITH RADIOACTIVE SEED AND SENTINEL LYMPH NODE BIOPSY (Right) - RIGHT BREAST SEED LUMPECTOMY x2, RIGHT SEED TARGETED LYMPH NODE BIOPSY, RIGHT SENTINEL LYMPH NODE MAPPING 90 MIN BREAST LUMPECTOMY WITH RADIOACTIVE SEED LOCALIZATION (Right) LUMPECTOMY, BREAST, WITH SENTINEL LYMPH NODE BIOPSY, AFTER MAGNETIC SEED LOCALIZATION (Right) as a surgical intervention.  The patient's history has been reviewed, patient examined, no change in status, stable for surgery.  I have reviewed the patient's chart and labs.  Questions were answered to the patient's satisfaction.     Autry Droege A Linkin Vizzini

## 2023-06-05 NOTE — Progress Notes (Signed)
Assisted Dr. Ellender with right, pectoralis, ultrasound guided block. Side rails up, monitors on throughout procedure. See vital signs in flow sheet. Tolerated Procedure well. ?

## 2023-06-06 ENCOUNTER — Encounter (HOSPITAL_BASED_OUTPATIENT_CLINIC_OR_DEPARTMENT_OTHER): Payer: Self-pay | Admitting: Surgery

## 2023-06-07 ENCOUNTER — Ambulatory Visit: Payer: Self-pay | Admitting: Surgery

## 2023-06-07 LAB — SURGICAL PATHOLOGY

## 2023-06-12 ENCOUNTER — Encounter: Payer: Self-pay | Admitting: *Deleted

## 2023-06-14 ENCOUNTER — Inpatient Hospital Stay (HOSPITAL_BASED_OUTPATIENT_CLINIC_OR_DEPARTMENT_OTHER): Admitting: Hematology and Oncology

## 2023-06-14 ENCOUNTER — Inpatient Hospital Stay

## 2023-06-14 VITALS — BP 132/75 | HR 89 | Temp 98.5°F | Resp 17 | Wt 286.7 lb

## 2023-06-14 DIAGNOSIS — Z79899 Other long term (current) drug therapy: Secondary | ICD-10-CM | POA: Diagnosis not present

## 2023-06-14 DIAGNOSIS — Z171 Estrogen receptor negative status [ER-]: Secondary | ICD-10-CM

## 2023-06-14 DIAGNOSIS — Z95828 Presence of other vascular implants and grafts: Secondary | ICD-10-CM

## 2023-06-14 DIAGNOSIS — C50411 Malignant neoplasm of upper-outer quadrant of right female breast: Secondary | ICD-10-CM | POA: Diagnosis not present

## 2023-06-14 DIAGNOSIS — Z7952 Long term (current) use of systemic steroids: Secondary | ICD-10-CM | POA: Diagnosis not present

## 2023-06-14 DIAGNOSIS — Z5112 Encounter for antineoplastic immunotherapy: Secondary | ICD-10-CM | POA: Diagnosis not present

## 2023-06-14 LAB — CMP (CANCER CENTER ONLY)
ALT: 70 U/L — ABNORMAL HIGH (ref 0–44)
AST: 39 U/L (ref 15–41)
Albumin: 3.7 g/dL (ref 3.5–5.0)
Alkaline Phosphatase: 113 U/L (ref 38–126)
Anion gap: 4 — ABNORMAL LOW (ref 5–15)
BUN: 15 mg/dL (ref 6–20)
CO2: 28 mmol/L (ref 22–32)
Calcium: 9.3 mg/dL (ref 8.9–10.3)
Chloride: 106 mmol/L (ref 98–111)
Creatinine: 0.8 mg/dL (ref 0.44–1.00)
GFR, Estimated: >60 mL/min (ref >60)
Glucose, Bld: 102 mg/dL — ABNORMAL HIGH (ref 70–99)
Potassium: 3.8 mmol/L (ref 3.5–5.1)
Sodium: 138 mmol/L (ref 135–145)
Total Bilirubin: 0.2 mg/dL (ref 0.0–1.2)
Total Protein: 7.2 g/dL (ref 6.5–8.1)

## 2023-06-14 LAB — CBC WITH DIFFERENTIAL (CANCER CENTER ONLY)
Abs Immature Granulocytes: 0 10*3/uL (ref 0.00–0.07)
Basophils Absolute: 0 10*3/uL (ref 0.0–0.1)
Basophils Relative: 0 %
Eosinophils Absolute: 0.1 10*3/uL (ref 0.0–0.5)
Eosinophils Relative: 4 %
HCT: 34.2 % — ABNORMAL LOW (ref 36.0–46.0)
Hemoglobin: 10.7 g/dL — ABNORMAL LOW (ref 12.0–15.0)
Immature Granulocytes: 0 %
Lymphocytes Relative: 34 %
Lymphs Abs: 1 10*3/uL (ref 0.7–4.0)
MCH: 26 pg (ref 26.0–34.0)
MCHC: 31.3 g/dL (ref 30.0–36.0)
MCV: 83.2 fL (ref 80.0–100.0)
Monocytes Absolute: 0.4 10*3/uL (ref 0.1–1.0)
Monocytes Relative: 13 %
Neutro Abs: 1.4 10*3/uL — ABNORMAL LOW (ref 1.7–7.7)
Neutrophils Relative %: 49 %
Platelet Count: 173 10*3/uL (ref 150–400)
RBC: 4.11 MIL/uL (ref 3.87–5.11)
RDW: 15.9 % — ABNORMAL HIGH (ref 11.5–15.5)
WBC Count: 2.9 10*3/uL — ABNORMAL LOW (ref 4.0–10.5)
nRBC: 0 % (ref 0.0–0.2)

## 2023-06-14 LAB — PREGNANCY, URINE: Preg Test, Ur: NEGATIVE

## 2023-06-14 MED ORDER — SODIUM CHLORIDE 0.9 % IV SOLN: INTRAVENOUS | Status: DC

## 2023-06-14 MED ORDER — SODIUM CHLORIDE 0.9% FLUSH
10.0000 mL | Freq: Once | INTRAVENOUS | Status: AC
  Administered 2023-06-14: 10 mL

## 2023-06-14 MED ORDER — DIPHENHYDRAMINE HCL 25 MG PO CAPS
50.0000 mg | ORAL_CAPSULE | Freq: Once | ORAL | Status: AC
  Administered 2023-06-14: 50 mg via ORAL
  Filled 2023-06-14: qty 2

## 2023-06-14 MED ORDER — SODIUM CHLORIDE 0.9 % IV SOLN
420.0000 mg | Freq: Once | INTRAVENOUS | Status: AC
  Administered 2023-06-14: 420 mg via INTRAVENOUS
  Filled 2023-06-14: qty 14

## 2023-06-14 MED ORDER — ACETAMINOPHEN 325 MG PO TABS
650.0000 mg | ORAL_TABLET | Freq: Once | ORAL | Status: AC
  Administered 2023-06-14: 650 mg via ORAL
  Filled 2023-06-14: qty 2

## 2023-06-14 MED ORDER — TRASTUZUMAB-ANNS CHEMO 150 MG IV SOLR
750.0000 mg | Freq: Once | INTRAVENOUS | Status: AC
  Administered 2023-06-14: 750 mg via INTRAVENOUS
  Filled 2023-06-14: qty 35.71

## 2023-06-14 NOTE — Patient Instructions (Signed)
 [facility] - A DEPT OF [facility]  Discharge Instructions: Thank you for choosing [facility] to provide your oncology and hematology care.   If you have a lab appointment with the Cancer Center, please go directly to the Cancer Center and check in at the registration area.   Wear comfortable clothing and clothing appropriate for easy access to any Portacath or PICC line.   We strive to give you quality time with your provider. You may need to reschedule your appointment if you arrive late (15 or more minutes).  Arriving late affects you and other patients whose appointments are after yours.  Also, if you miss three or more appointments without notifying the office, you may be dismissed from the clinic at the provider's discretion.      For prescription refill requests, have your pharmacy contact our office and allow 72 hours for refills to be completed.    Today you received the following chemotherapy and/or immunotherapy agents: trastuzumab  and pertuzumab       To help prevent nausea and vomiting after your treatment, we encourage you to take your nausea medication as directed.  BELOW ARE SYMPTOMS THAT SHOULD BE REPORTED IMMEDIATELY: *FEVER GREATER THAN 100.4 F (38 C) OR HIGHER *CHILLS OR SWEATING *NAUSEA AND VOMITING THAT IS NOT CONTROLLED WITH YOUR NAUSEA MEDICATION *UNUSUAL SHORTNESS OF BREATH *UNUSUAL BRUISING OR BLEEDING *URINARY PROBLEMS (pain or burning when urinating, or frequent urination) *BOWEL PROBLEMS (unusual diarrhea, constipation, pain near the anus) TENDERNESS IN MOUTH AND THROAT WITH OR WITHOUT PRESENCE OF ULCERS (sore throat, sores in mouth, or a toothache) UNUSUAL RASH, SWELLING OR PAIN  UNUSUAL VAGINAL DISCHARGE OR ITCHING   Items with * indicate a potential emergency and should be followed up as soon as possible or go to the Emergency Department if any problems should occur.  Please show the CHEMOTHERAPY ALERT CARD  or IMMUNOTHERAPY ALERT CARD at check-in to the Emergency Department and triage nurse.  Should you have questions after your visit or need to cancel or reschedule your appointment, please contact [facility] - A DEPT OF [facility]  Dept: [phone]  and follow the prompts.  Office hours are 8:00 a.m. to 4:30 p.m. Monday - Friday. Please note that voicemails left after 4:00 p.m. may not be returned until the following business day.  We are closed weekends and major holidays. You have access to a nurse at all times for urgent questions. Please call the main number to the clinic Dept: [phone] and follow the prompts.   For any non-urgent questions, you may also contact your provider using MyChart. We now offer e-Visits for anyone 30 and older to request care online for non-urgent symptoms. For details visit [email or web address].   Also download the MyChart app! Go to the app store, search "MyChart", open the app, select [facility], and log in with your MyChart username and password.

## 2023-06-14 NOTE — Assessment & Plan Note (Signed)
 12/18/2022:Palpable right breast lump (irregular density) by ultrasound 1.3 cm at 9:30 position, ultrasound-guided biopsy: Grade 2 IDC no LVI, ER 0%, PR 0%, Ki67 30%, HER2 3+ positive, 1 axillary lymph node with cortical thickening biopsy: Positive    Breast MRI 01/08/2023: 1.6 cm mass, 2.4 cm non-mass enhancement: Biopsy: DCIS   Treatment plan: Neoadjuvant chemotherapy with [facility] Perjeta  01/12/2023-04/26/2023 06/05/2023: Right lumpectomy: Negative for residual cancer, pathologic complete response 0/1 lymph node negative Adjuvant radiation therapy ----------------------------------------------------------------------------------------------------------------------------------------------- Current treatment: Herceptin  and Perjeta  maintenance Pathology counseling: I discussed the final pathology report of the patient provided  a copy of this report. I discussed the margins as well as lymph node surgeries. We also discussed the final staging along with previously performed ER/PR and HER-2/neu testing.  Return to clinic every 3 weeks for Herceptin  and Perjeta  every 6 weeks and follow-up with me

## 2023-06-14 NOTE — Progress Notes (Signed)
 Patient Care Team: Danella Dunn, MD as PCP - General (Family Medicine) Alane Hsu, RN as Oncology Nurse Navigator Mark Sil, Linde Reveal, RN as Oncology Nurse Navigator Retta Caster, MD as Consulting Physician (Radiation Oncology) Cameron Cea, MD as Consulting Physician (Hematology and Oncology) Sim Dryer, MD as Consulting Physician (General Surgery)  DIAGNOSIS:  Encounter Diagnosis  Name Primary?   Malignant neoplasm of upper-outer quadrant of right breast in female, estrogen receptor negative (HCC) Yes    SUMMARY OF ONCOLOGIC HISTORY: Oncology History  Malignant neoplasm of upper-outer quadrant of right breast in female, estrogen receptor negative (HCC)  12/18/2022 Initial Diagnosis   Palpable right breast lump (irregular density) by ultrasound 1.3 cm at 9:30 position, ultrasound-guided biopsy: Grade 2 IDC no LVI, ER 0%, PR 0%, Ki67 30%, HER2 3+ positive, 1 axillary lymph node with cortical thickening biopsy: Positive   12/27/2022 Cancer Staging   Staging form: Breast, AJCC 8th Edition - Clinical: Stage IIA (cT1c, cN1, cM0, G2, ER-, PR-, HER2+) - Signed by Cameron Cea, MD on 12/27/2022 Stage prefix: Initial diagnosis Histologic grading system: 3 grade system   01/11/2023 Genetic Testing   Negative [facility] Cancer[employer] +RNAinsight Panel.  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 Report date is 01/11/2023.   The [facility] CancerNext+RNAinsight Panel includes sequencing, rearrangement analysis, and RNA analysis for the following 39 genes: APC, ATM, BAP1, BARD1, BMPR1A, BRCA1, BRCA2, BRIP1, CDH1, CDKN2A, CHEK2, FH, FLCN, MET, MLH1, MSH2, MSH6, MUTYH, NF1, NTHL1, PALB2, PMS2, PTEN, RAD51C, RAD51D, SMAD4, STK11, TP53, TSC1, TSC2, and VHL (sequencing and deletion/duplication); AXIN2, HOXB13, MBD4, MSH3, POLD1 and POLE (sequencing only); EPCAM and GREM1 (deletion/duplication only).    01/12/2023 -  Chemotherapy   Patient is on Treatment Plan : BREAST  Docetaxel  + Carboplatin  + Trastuzumab  +  Pertuzumab   (TCHP) q21d      06/05/2023 Surgery   Right lumpectomy: Pathologic complete response     CHIEF COMPLIANT: Follow-up on Herceptin  and Perjeta   HISTORY OF PRESENT ILLNESS:   History of Present Illness Kara Webb is a 43 year old female with breast cancer who presents for follow-up after surgery.  She is in the recovery phase following her breast cancer surgery on May 20th. She experiences soreness, particularly under her arm, which affects her ability to reach for objects. Swelling has decreased, and there is no numbness under her arm.  She is concerned about potential lymphedema and inquires about the need for a compression sleeve when flying. She has not yet had an appointment with physical therapy for measurements and prescription of the appropriate compression garment.  She is currently on a treatment regimen that includes two medications. Her blood work shows improvement with increasing hemoglobin and white blood cell counts, and stabilizing liver function.     ALLERGIES:  is allergic to [facility]  dimeglumine].  MEDICATIONS:  Current Outpatient Medications  Medication Sig Dispense Refill   ALPRAZolam  (XANAX ) 0.5 MG tablet Take 1 tablet (0.5 mg total) by mouth at bedtime as needed for anxiety or sleep. 20 tablet 0   azithromycin  (ZITHROMAX  Z-PAK) 250 MG tablet Per instructions. Only take if symptoms worsen and flu/covid negative, notify cancer center prior to starting 6 each 0   dexamethasone  (DECADRON ) 4 MG tablet Take 2 tabs by mouth 2 times daily starting day before chemo. Then take 2 tabs daily for 2 days starting day after chemo. Take with food. 30 tablet 1   ferrous gluconate  (FERGON) 324 MG tablet Take 1 tablet (324 mg total) by mouth 2 (two) times daily  with a meal. 60 tablet 6   ibuprofen (ADVIL) 800 MG tablet Take 1 tablet (800 mg total) by mouth every 8 (eight) hours as needed. 30 tablet 0   IUD'S IU IUD's     lidocaine -prilocaine  (EMLA ) cream  Apply to affected area once 30 g 3   ondansetron  (ZOFRAN ) 8 MG tablet Take 1 tablet (8 mg total) by mouth every 8 (eight) hours as needed for nausea or vomiting. Start on the third day after chemotherapy. (Patient not taking: Reported on 04/26/2023) 30 tablet 1   oxyCODONE  (OXY IR/ROXICODONE ) 5 MG immediate release tablet Take 1 tablet (5 mg total) by mouth every 6 (six) hours as needed for severe pain (pain score 7-10). 15 tablet 0   prochlorperazine  (COMPAZINE ) 10 MG tablet Take 1 tablet (10 mg total) by mouth every 6 (six) hours as needed for nausea or vomiting. (Patient not taking: Reported on 04/26/2023) 30 tablet 1   No current facility-administered medications for this visit.    PHYSICAL EXAMINATION: ECOG PERFORMANCE STATUS: 1 - Symptomatic but completely ambulatory  Vitals:   06/14/23 1049  BP: 132/75  Pulse: 89  Resp: 17  Temp: 98.5 F (36.9 C)  SpO2: 100%   Filed Weights   06/14/23 1049  Weight: 286 lb 11.2 oz (130 kg)      LABORATORY DATA:  I have reviewed the data as listed    Latest Ref Rng & Units 05/17/2023   10:33 AM 04/26/2023    9:54 AM 04/05/2023    7:42 AM  CMP  Glucose 70 - 99 mg/dL 161  096  045   BUN 6 - 20 mg/dL 14  17  14    Creatinine 0.44 - 1.00 mg/dL 4.09  8.11  9.14   Sodium 135 - 145 mmol/L 140  137  138   Potassium 3.5 - 5.1 mmol/L 4.0  4.1  3.6   Chloride 98 - 111 mmol/L 107  106  106   CO2 22 - 32 mmol/L 29  27  26    Calcium 8.9 - 10.3 mg/dL 9.1  9.8  9.7   Total Protein 6.5 - 8.1 g/dL 7.1  7.7  7.5   Total Bilirubin 0.0 - 1.2 mg/dL 0.3  0.2  0.2   Alkaline Phos 38 - 126 U/L 117  114  81   AST 15 - 41 U/L 81  66  25   ALT 0 - 44 U/L 114  153  50     Lab Results  Component Value Date   WBC 2.9 (L) 06/14/2023   HGB 10.7 (L) 06/14/2023   HCT 34.2 (L) 06/14/2023   MCV 83.2 06/14/2023   PLT 173 06/14/2023   NEUTROABS 1.4 (L) 06/14/2023    ASSESSMENT & PLAN:  Malignant neoplasm of upper-outer quadrant of right breast in female, estrogen  receptor negative (HCC) 12/18/2022:Palpable right breast lump (irregular density) by ultrasound 1.3 cm at 9:30 position, ultrasound-guided biopsy: Grade 2 IDC no LVI, ER 0%, PR 0%, Ki67 30%, HER2 3+ positive, 1 axillary lymph node with cortical thickening biopsy: Positive    Breast MRI 01/08/2023: 1.6 cm mass, 2.4 cm non-mass enhancement: Biopsy: DCIS   Treatment plan: Neoadjuvant chemotherapy with [facility] Perjeta  01/12/2023-04/26/2023 06/05/2023: Right lumpectomy: Negative for residual cancer, pathologic complete response 0/1 lymph node negative Adjuvant radiation therapy ----------------------------------------------------------------------------------------------------------------------------------------------- Current treatment: Herceptin  and Perjeta  maintenance Pathology counseling: I discussed the final pathology report of the patient provided  a copy of this report. I discussed the margins as well as  lymph node surgeries. We also discussed the final staging along with previously performed ER/PR and HER-2/neu testing.  Return to clinic every 3 weeks for Herceptin  and Perjeta  every 6 weeks and follow-up with me ------------------------------------- Assessment and Plan Assessment & Plan Malignant neoplasm of right breast Complete response to treatment with no evidence of cancer. Lymph nodes clear. Blood work improving, liver function normalizing. - Monitor blood work and recovery. - Advise pseudoephedrine, guaifenesin, or loratadine for congestion without fever or chills. - Schedule port removal after last treatment in December. - Allow manicures, pedicures, and tattoos as more than a month has passed since last treatment.  Radiation therapy for breast cancer Radiation therapy remains standard despite complete response. Duration and specifics depend on breast size and pathological response. - Proceed with radiation therapy as per current standard of care. - Coordinate with radiation oncology  to determine specific radiation schedule.  Follow-up care Regular follow-up necessary to monitor recovery and treatment progress. Physical therapy needed for compression sleeve fitting due to potential lymphedema risk when flying. - Schedule physical therapy appointment for compression sleeve fitting. - Plan follow-up appointments with lab checks as needed. - End treatment on December 4th, with port removal to be scheduled soon after.      No orders of the defined types were placed in this encounter.  The patient has a good understanding of the overall plan. she agrees with it. she will call with any problems that may develop before the next visit here. Total time spent: 30 mins including face to face time and time spent for planning, charting and co-ordination of care   Margert Sheerer, MD 06/14/23

## 2023-06-21 ENCOUNTER — Ambulatory Visit: Attending: Hematology and Oncology | Admitting: Physical Therapy

## 2023-06-21 DIAGNOSIS — C50411 Malignant neoplasm of upper-outer quadrant of right female breast: Secondary | ICD-10-CM | POA: Diagnosis not present

## 2023-06-21 DIAGNOSIS — R293 Abnormal posture: Secondary | ICD-10-CM | POA: Insufficient documentation

## 2023-06-21 DIAGNOSIS — Z171 Estrogen receptor negative status [ER-]: Secondary | ICD-10-CM | POA: Insufficient documentation

## 2023-06-21 NOTE — Therapy (Signed)
 OUTPATIENT PHYSICAL THERAPY BREAST CANCER POST OP FOLLOW UP   Patient Name: Kara Webb MRN: [id number] DOB:10-02-1980, 43 y.o., female Today's Date: 06/21/2023  END OF SESSION:  PT End of Session - 06/21/23 1408     Visit Number 2    Number of Visits 2    Date for PT Re-Evaluation 06/27/23    PT Start Time 1407    PT Stop Time 1455    PT Time Calculation (min) 48 min    Activity Tolerance Patient tolerated treatment well    Behavior During Therapy [facility] for tasks assessed/performed             Past Medical History:  Diagnosis Date   Breast cancer (HCC)    BV (bacterial vaginosis)    Cold intolerance 01/16/2005   Fatigue 01/16/2005   H/O chlamydia infection 08/12/2010   H/O urinary frequency 11/16/2000   H/O vaginal discharge 11/17/2003   H/O varicella    Heart murmur    Born with murmur   HSV-1 infection    Hx: UTI (urinary tract infection)    Pelvic pain    Vulvovaginal candidiasis 04/16/2005   Yeast infection    Past Surgical History:  Procedure Laterality Date   BREAST BIOPSY Right 12/18/2022   US  RT BREAST BX W LOC DEV 1ST LESION IMG BX SPEC US  GUIDE 12/18/2022 [facility]   BREAST BIOPSY  06/01/2023   US  RT RADIOACTIVE SEED LOC 06/01/2023 [facility]   BREAST BIOPSY  06/01/2023   MM RT RADIOACTIVE SEED LOC MAMMO GUIDE 06/01/2023 [facility]   BREAST BIOPSY  06/01/2023   MM RT RADIOACTIVE SEED EA ADD LESION LOC MAMMO GUIDE 06/01/2023 [facility]   BREAST LUMPECTOMY WITH RADIOACTIVE SEED AND SENTINEL LYMPH NODE BIOPSY Right 06/05/2023   Procedure: RIGHT BREAST SEED LUMPECTOMY x2, RIGHT SEED TARGETED LYMPH NODE BIOPSY, RIGHT SENTINEL LYMPH NODE MAPPING;  Surgeon: Sim Dryer, MD;  Location: [facility];  Service: General;  Laterality: Right;  RIGHT BREAST SEED LUMPECTOMY x2, RIGHT SEED TARGETED LYMPH NODE BIOPSY, RIGHT SENTINEL LYMPH NODE MAPPING 90 MIN   IR CV LINE INJECTION  04/13/2023   PORTACATH PLACEMENT N/A 01/03/2023    Procedure: INSERTION PORT-A-CATH WITH ULTRASOUND GUIDEANCE;  Surgeon: Sim Dryer, MD;  Location:  SURGERY CENTER;  Service: General;  Laterality: N/A;   TOE SURGERY  2004   WISDOM TOOTH EXTRACTION  2003   Patient Active Problem List   Diagnosis Date Noted   Port-A-Cath in place 01/12/2023   Genetic testing 01/12/2023   Family history of breast cancer 12/27/2022   Family history of colon cancer 12/27/2022   Malignant neoplasm of upper-outer quadrant of right breast in female, estrogen receptor negative (HCC) 12/25/2022    PCP: Danella Dunn, MD  REFERRING PROVIDER: Dr. Sim Dryer   REFERRING DIAG: Right breast cancer  THERAPY DIAG:  Abnormal posture  Malignant neoplasm of upper-outer quadrant of right breast in female, estrogen receptor negative (HCC)  Rationale for Evaluation and Treatment: Rehabilitation  ONSET DATE: 12/12/2022  SUBJECTIVE:  SUBJECTIVE STATEMENT: Overall I am feeling good. I am still sore where my incisions were.   PERTINENT HISTORY:  Patient was diagnosed on 12/12/2022 with right grade 2 invasive ductal carcinoma breast cancer. It measures 1.3 cm and is located in the upper outer quadrant. It is ER/PR negative and HER2 positive with a Ki67 of 30%. She has a positive axillary lymph node. Completed neoadjuvant chemo. Underwent R breast lumpectomy and SLNB 0/1 on 06/05/23.  PATIENT GOALS:  Reassess how my recovery is going related to arm function, pain, and swelling.  PAIN:  Are you having pain? Yes: NPRS scale: 5/10 Pain location: R armpit Pain description: tingling, ache Aggravating factors: reaching Relieving factors: ibuprofen    PRECAUTIONS: Recent Surgery, right UE Lymphedema risk,   RED FLAGS: None   ACTIVITY LEVEL / LEISURE: not currently  exercise   OBJECTIVE:   PATIENT SURVEYS:  QUICK DASH:  Quick Dash - 06/21/23 0001     Open a tight or new jar Mild difficulty    Do heavy household chores (wash walls, wash floors) Mild difficulty    Carry a shopping bag or briefcase Mild difficulty    Wash your back Mild difficulty    Use a knife to cut food Mild difficulty    Recreational activities in which you take some force or impact through your arm, shoulder, or hand (golf, hammering, tennis) Moderate difficulty    During the past week, to what extent has your arm, shoulder or hand problem interfered with your normal social activities with family, friends, neighbors, or groups? Slightly    During the past week, to what extent has your arm, shoulder or hand problem limited your work or other regular daily activities Slightly    Arm, shoulder, or hand pain. Mild    Tingling (pins and needles) in your arm, shoulder, or hand Mild    Difficulty Sleeping Mild difficulty    DASH Score 27.27 %              OBSERVATIONS/PALPATION: Healing scars, lymph node biopsy scar with some increased scar tissue. Educated about scar massage at 6 weeks. No lymphedema present.   POSTURE:  Forward head and rounded shoulders posture   UPPER EXTREMITY AROM/PROM:   A/PROM RIGHT   eval   RIGHT 06/21/23  Shoulder extension 58 80  Shoulder flexion 154 168  Shoulder abduction 144 150  Shoulder internal rotation 74 68  Shoulder external rotation 88 89                          (Blank rows = not tested)   A/PROM LEFT   eval  Shoulder extension 57  Shoulder flexion 138  Shoulder abduction 152  Shoulder internal rotation 73  Shoulder external rotation 88                          (Blank rows = not tested)   CERVICAL AROM: All within normal limits   UPPER EXTREMITY STRENGTH: WNL   LYMPHEDEMA ASSESSMENTS (in cm):    LANDMARK RIGHT   eval RIGHT 06/21/23  10 cm proximal to olecranon process 38.4 36.5  Olecranon process 30.9 30.5  10 cm  proximal to ulnar styloid process 28.5 26.5  Just proximal to ulnar styloid process 18.1 18.5  Across hand at thumb web space 19.9 21  At base of 2nd digit 7 6.9  (Blank rows = not tested)   LANDMARK LEFT  eval LEFT 06/21/23  10 cm proximal to olecranon process 37.7 35.3  Olecranon process 29.9 30  10  cm proximal to ulnar styloid process 28.5 26.4  Just proximal to ulnar styloid process 19 18.6  Across hand at thumb web space 21 22  At base of 2nd digit 7 7  (Blank rows = not tested)  Surgery type/Date: 06/05/23 R breast lumpectomy and SLNB Number of lymph nodes removed: 0/1 Current/past treatment (chemo, radiation, hormone therapy): completed neoadjuvant, will require radiation, will do immunotherapy until the end of the year  Other symptoms:  Heaviness/tightness No Pain Yes Pitting edema No Infections No Decreased scar mobility Yes but still healing Stemmer sign No  TREATMENT PERFORMED: 06/21/23: Measured pt for a compression sleeve and glove - Sigvaris Secure 20-28mmHg size M4 sleeve and medium gauntlet in cocoa to wear when she flies. Instructed pt in supine dowel flexion and abduction but pt did not feel a stretch with these so stopped.   PATIENT EDUCATION:  Education details: lymphedema risk reduction class, importance of stretching during radiation, importance of walking program, compression sleeve Person educated: Patient Education method: Explanation Education comprehension: verbalized understanding  HOME EXERCISE PROGRAM: Reviewed previously given post op HEP.   ASSESSMENT:  CLINICAL IMPRESSION: Pt returns to PT after undergoing a R breast lumpectomy and SLNB on 06/05/23 with one negative node removed. She has returned to baseline shoulder ROM. Her scars are healing well. She was educated to begin scar massage at 6 weeks. She was given info to obtain a compression sleeve and gauntlet to wear when she flies to help decrease risk of developing lymphedema. She will be  discharged from skilled PT services at this time and will continue ldex screenings every 3 months for the first 2 years post op to assess for subclinical lymphedema.   Pt will benefit from skilled therapeutic intervention to improve on the following deficits: Decreased knowledge of precautions, impaired UE functional use, pain, decreased ROM, postural dysfunction.   PT treatment/interventions: ADL/Self care home management, [phone]- PT Re-evaluation, 97110-Therapeutic exercises, 97530- Therapeutic activity, [id number]- Neuromuscular re-education, 97535- Self Care, and [id number]- Manual therapy   GOALS: Goals reviewed with patient? Yes  LONG TERM GOALS:  (STG=LTG)  GOALS Name Target Date  Goal status  1 Pt will demonstrate she has regained full shoulder ROM and function post operatively compared to baselines.  Baseline: 06/21/23  INITIAL     PLAN:  PT FREQUENCY/DURATION: d/c this session  PLAN FOR NEXT SESSION: d/c this session, continue ldex screenings every 3 months   [facility]  [address], [address]  [place] [address]  [phone]  After Breast Cancer Class Video It is recommended you view the ABC class video to be educated on lymphedema risk reduction. This video lasts for about 30 minutes. It can be viewed on our website here: [email or web address]  Scar massage at 6 weeks You can begin gentle scar massage to you incision sites. Gently place one hand on the incision and move the skin (without sliding on the skin) in various directions. Do this for a few minutes and then you can gently massage either coconut oil or vitamin E cream into the scars.  Compression garment You should continue wearing your compression bra until you feel like you no longer have swelling.  Home exercise Program Continue doing the exercises you were given until you feel like you can do them without feeling any  tightness at the end.   Walking Program Studies show that 30  minutes of walking per day (fast enough to elevate your heart rate) can significantly reduce the risk of a cancer recurrence. If you can't walk due to other medical reasons, we encourage you to find another activity you could do (like a stationary bike or water exercise).  Posture After breast cancer surgery, people frequently sit with rounded shoulders posture because it puts their incisions on slack and feels better. If you sit like this and scar tissue forms in that position, you can become very tight and have pain sitting or standing with good posture. Try to be aware of your posture and sit and stand up tall to heal properly.  Follow up PT: It is recommended you return every 3 months for the first 3 years following surgery to be assessed on the SOZO machine for an L-Dex score. This helps prevent clinically significant lymphedema in 95% of patients. These follow up screens are 10 minute appointments that you are not billed for.  Flonnie Wierman Breedlove Blue, PT 06/21/2023, 3:00 PM  PHYSICAL THERAPY DISCHARGE SUMMARY  Visits from Start of Care: 2  Current functional level related to goals / functional outcomes: All goals met   Remaining deficits: None   Education / Equipment: HEP, lymphedema education, compression sleeve/gauntlet, walking program   Patient agrees to discharge. Patient goals were met. Patient is being discharged due to meeting the stated rehab goals.

## 2023-06-22 ENCOUNTER — Other Ambulatory Visit: Payer: Self-pay

## 2023-06-26 NOTE — Progress Notes (Signed)
 Radiation Oncology         [phone] ________________________________  Name: Kara Webb MRN: [id number]  Date: 06/28/2023  DOB: 06/09/1980  Re-Evaluation Note  CC: Danella Dunn, MD  Cameron Cea, MD    ICD-10-CM   1. Malignant neoplasm of upper-outer quadrant of right breast in female, estrogen receptor negative (HCC)  C50.411 Pregnancy, urine   Z17.1 Pregnancy, urine      Diagnosis: Stage IIA (cT1c, cN1, cM0) Right Breast UOQ, Invasive Ductal Carcinoma, ER- / PR- / Her2+, Grade 2 [ypT0,ypN0]  Narrative:  The patient returns today to discuss radiation treatment options. She was seen in the multidisciplinary breast clinic on 12/27/22.  Since consultation, she underwent genetic testing on 01/15/24. Results showed no pathogenic mutations contributing to her her recent diagnosis.  She was treated with neoadjuvant chemotherapy  with Docetaxel  + Carboplatin  + Trastuzumab  + Pertuzumab  (TCHP) q21d initiated on 01/12/23 to 04/26/2023 under Dr. Lee Public.  MRI of the bilateral breast on 04/27/2023 demonstrated no enhancing mass at the biopsy site and a decrease in size of the previous prominent right axillary lymph nodes. Per Dr. Alix Aquas recommendations, she continued with Herceptin  and Perjeta  maintenance therapy every 3 weeks.  She opted to proceed with a right breast seed lumpectomy and a right targeted lymph node biopsy on 06/05/23 under the care of Dr. Afton Horse. Pathology from the procedure revealed no residual carcinoma in the lumpectomy cavity or the sampled node.   To review, her original biopsy of the right breast on 12/18/2022 demonstrated grade 2 invasive ductal carcinoma, ER/PR -, HER2+. 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   During most recent follow up with Dr. Lee Public on 06/14/23 patient reported mild soreness from operation but otherwise she was doing well overall and she proceeded with her second cycle  of Herceptin  and Perjeta  maintenance therapy.  On review of systems, the patient reports to be doing well overall. She notes occasional breast twinges and mild left breast swelling. She otherwise denies any breast pain, issues with range motions, or other breast specific complaints. She continues to wear her compression bra, given to her by surgery.    Allergies:  is allergic to emend [fosaprepitant  dimeglumine].  Meds: Current Outpatient Medications  Medication Sig Dispense Refill   ALPRAZolam  (XANAX ) 0.5 MG tablet Take 1 tablet (0.5 mg total) by mouth at bedtime as needed for anxiety or sleep. 20 tablet 0   ferrous gluconate  (FERGON) 324 MG tablet Take 1 tablet (324 mg total) by mouth 2 (two) times daily with a meal. 60 tablet 6   ibuprofen  (ADVIL ) 800 MG tablet Take 1 tablet (800 mg total) by mouth every 8 (eight) hours as needed. 30 tablet 0   IUD'S IU IUD's     lidocaine -prilocaine  (EMLA ) cream Apply to affected area once 30 g 3   ondansetron  (ZOFRAN ) 8 MG tablet Take 1 tablet (8 mg total) by mouth every 8 (eight) hours as needed for nausea or vomiting. Start on the third day after chemotherapy. 30 tablet 1   azithromycin  (ZITHROMAX  Z-PAK) 250 MG tablet Per instructions. Only take if symptoms worsen and flu/covid negative, notify cancer center prior to starting (Patient not taking: Reported on 06/28/2023) 6 each 0   dexamethasone  (DECADRON ) 4 MG tablet Take 2 tabs by mouth 2 times daily starting day before chemo. Then take 2 tabs daily for 2 days starting day after chemo. Take with food. (Patient not taking: Reported on 06/28/2023) 30 tablet 1  oxyCODONE  (OXY IR/ROXICODONE ) 5 MG immediate release tablet Take 1 tablet (5 mg total) by mouth every 6 (six) hours as needed for severe pain (pain score 7-10). (Patient not taking: Reported on 06/28/2023) 15 tablet 0   prochlorperazine  (COMPAZINE ) 10 MG tablet Take 1 tablet (10 mg total) by mouth every 6 (six) hours as needed for nausea or vomiting.  (Patient not taking: Reported on 06/28/2023) 30 tablet 1   No current facility-administered medications for this encounter.    Physical Findings: The patient is in no acute distress. Patient is alert and oriented.  height is 5' 6 (1.676 m) and weight is 287 lb (130.2 kg). Her temperature is 96.9 F (36.1 C) (abnormal). Her blood pressure is 146/99 (abnormal) and her pulse is 92. Her respiration is 18 and oxygen saturation is 100%.  Lungs are clear to auscultation bilaterally. Heart has regular rate and rhythm. No palpable cervical, supraclavicular, or axillary adenopathy. Abdomen soft, non-tender, normal bowel sounds.  Left Breast: no palpable mass, nipple discharge or bleeding. Right Breast: Surgical incisions with well-approximated skin edges covered with surgical glue. Associated firmness. No other abnormalities appreciated.   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 Radioactive seed localization of the 2 sites of malignancy in the breast and the metastatic RIGHT axillary lymph node was performed 06/01/2023 in anticipation of today's lumpectomy and targeted node dissection. EXAM: SPECIMEN RADIOGRAPH OF THE RIGHT BREAST x 2 COMPARISON:  Previous exam(s). FINDINGS: Status post excision of the RIGHT breast and a metastatic RIGHT axillary lymph node. The coil shaped tissue marking clip, cylinder shaped tissue marking clip, and the 2 radioactive seeds are present in the breast specimen. The seed are intact. The [facility] spiral shaped tissue marking clip, the radioactive seed and the  lymph node are present in the axillary specimen. The seed is intact. This was discussed by telephone with the operating room nurse at the time of interpretation on 06/05/2023 at 8:52 a.m. IMPRESSION: Specimen radiographs (x2) of the RIGHT breast and RIGHT axilla. Electronically Signed   By: Rinda Cheers M.D.   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 Radioactive seed localization of the 2 sites of malignancy in the breast and the metastatic RIGHT axillary lymph node was performed 06/01/2023 in anticipation of today's lumpectomy and targeted node dissection. EXAM: SPECIMEN RADIOGRAPH OF THE RIGHT BREAST x 2 COMPARISON:  Previous exam(s). FINDINGS: Status post excision of the RIGHT breast and a metastatic RIGHT axillary lymph node. The coil shaped tissue marking clip, cylinder shaped tissue marking clip, and the 2 radioactive seeds are present in the breast specimen. The seed are intact. The [facility] spiral shaped tissue marking clip, the radioactive seed and the lymph node are present in the axillary specimen. The seed is intact. This was discussed by telephone with the operating room nurse at the time of interpretation on 06/05/2023 at 8:52 a.m. IMPRESSION: Specimen radiographs (x2) of  the RIGHT breast and RIGHT axilla. Electronically Signed   By: Rinda Cheers M.D.   On: 06/05/2023 08:55   MM RT RADIO SEED EA ADD LESION LOC MAMMO Result Date: 06/01/2023 CLINICAL DATA:  43 year old female presenting for seed localization x2 of the right breast. Patient had invasive and in-situ carcinoma of the right breast diagnosed in late 2024 and has subsequently undergone neoadjuvant chemotherapy. She is scheduled for breast conservation surgery. EXAM: MAMMOGRAPHIC GUIDED  RADIOACTIVE SEED LOCALIZATION OF THE RIGHT BREAST x 2 COMPARISON:  Previous exam(s). FINDINGS: Patient presents for radioactive seed localization prior to right breast lumpectomies. I met with the patient and we discussed the procedure of seed localization including benefits and alternatives. We discussed the high likelihood of a successful procedure. We discussed the risks of the procedure including infection, bleeding, tissue injury and further surgery. We discussed the low dose of radioactivity involved in the procedure. Informed, written consent was given. The usual time-out protocol was performed immediately prior to the procedure. Using mammographic guidance, sterile technique, 1% lidocaine  and an I-125 radioactive seed, the coil biopsy marking clip in the right breast was localized using a lateral approach. The follow-up mammogram images confirm the seed in the expected location and were marked for Dr. Afton Horse. Follow-up survey of the patient confirms presence of the radioactive seed. Order number of I-125 seed:  [id number]. Total activity: 0.252 mCi reference Date: 05/11/2023 Using mammographic guidance, sterile technique, 1% lidocaine  and an I-125 radioactive seed, the cylinder clip in the right breast was localized using a lateral approach. The follow-up mammogram images confirm the seed in the expected location and were marked for Dr. Afton Horse. Follow-up survey of the patient confirms presence of the radioactive seed. Order number of I-125 seed:  [id number]. Total activity: 0.239 mCi reference Date: 05/02/2023 The patient tolerated the procedure well and was released from the Breast Center. She was given instructions regarding seed removal. IMPRESSION: Radioactive seed localization right breast x2. No apparent complications. Electronically Signed   By: Allena Ito M.D.   On: 06/01/2023 12:11   MM RT RADIOACTIVE SEED LOC MAMMO GUIDE Result Date: 06/01/2023 CLINICAL DATA:  43 year old female  presenting for seed localization x2 of the right breast. Patient had invasive and in-situ carcinoma of the right breast diagnosed in late 2024 and has subsequently undergone neoadjuvant chemotherapy. She is scheduled for breast conservation surgery. EXAM: MAMMOGRAPHIC GUIDED RADIOACTIVE SEED LOCALIZATION OF THE RIGHT BREAST x 2 COMPARISON:  Previous exam(s). FINDINGS: Patient presents for radioactive seed localization prior to right breast lumpectomies. I met with the patient and we discussed the procedure of seed localization including benefits and alternatives. We discussed the high likelihood of a successful procedure. We discussed the risks of the procedure including infection, bleeding, tissue injury and further surgery. We discussed the low dose of radioactivity involved in the procedure. Informed, written consent was given. The usual time-out protocol was performed immediately prior to the procedure. Using mammographic guidance, sterile technique, 1% lidocaine  and an I-125 radioactive seed, the coil biopsy marking clip in the right breast was localized using a lateral approach. The follow-up mammogram images confirm the seed in the expected location and were marked for Dr. Afton Horse. Follow-up survey of the patient confirms presence of the radioactive seed. Order number of I-125 seed:  [id number]. Total activity: 0.252 mCi reference Date: 05/11/2023 Using mammographic guidance, sterile technique, 1% lidocaine  and an I-125 radioactive seed, the cylinder clip in the right breast was localized using a lateral approach. The follow-up mammogram images  confirm the seed in the expected location and were marked for Dr. Afton Horse. Follow-up survey of the patient confirms presence of the radioactive seed. Order number of I-125 seed:  [id number]. Total activity: 0.239 mCi reference Date: 05/02/2023 The patient tolerated the procedure well and was released from the Breast Center. She was given instructions regarding seed  removal. IMPRESSION: Radioactive seed localization right breast x2. No apparent complications. Electronically Signed   By: Allena Ito M.D.   On: 06/01/2023 12:11   US  RT RADIOACTIVE SEED LOC Result Date: 06/01/2023 CLINICAL DATA:  43 year old female presenting for seed localization of the right axilla. 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 She is scheduled for right axillary dissection. EXAM: ULTRASOUND GUIDED RADIOACTIVE SEED LOCALIZATION OF THE RIGHT BREAST COMPARISON:  Previous exam(s). FINDINGS: Patient presents for radioactive seed localization prior to right axillary dissection. I met with the patient and we discussed the procedure of seed localization including benefits and alternatives. We discussed the high likelihood of a successful procedure. We discussed the risks of the procedure including infection, bleeding, tissue injury and further surgery. We discussed the low dose of radioactivity involved in the procedure. Informed, written consent was given. The usual time-out protocol was performed immediately prior to the procedure. Using ultrasound guidance, sterile technique, 1% lidocaine  and an I-125 radioactive seed, the biopsy lymph node in the right axilla was localized using a lateral approach. The follow-up mammogram images confirm the seed in the expected location and were marked for Dr. Afton Horse. Follow-up survey of the patient confirms presence of the radioactive seed. Order number of I-125 seed:  [id number]. Total activity: 0.239 mCi reference Date: 05/02/2023 A postprocedure mammogram demonstrates appropriate placement of the radioactive seed in the biopsied lymph node. The patient tolerated the procedure well and was released from the Breast Center. She was given instructions regarding seed removal. IMPRESSION: Radioactive seed localization right axilla. No apparent complications. Electronically  Signed   By: Allena Ito M.D.   On: 06/01/2023 12:05    Impression:  Stage IIA (cT1c, cN1, cM0) Right Breast UOQ, Invasive Ductal Carcinoma, ER- / PR- / Her2+, Grade 2; s/p neoadjuvant chemotherapy and right lumpectomy, on Perjeta  and Herceptin  maintenance  Patient continues to heal from well her lumpectomy and targeted lymph node biopsy. Pathology demonstrates a complete response. Dr. Eloise Hake recommends adjuvant radiation to reduce the risk of locoregional disease recurrence. We reviewed the NSABP B-51 trial which shows no reduction in disease recurrence or cancer related deaths in T1-T3 node-positive breast cancer patients who convert to pathologically node-negative nodal status after induction chemotherapy and did not receive adjuvant regional nodal irradiation. After extensive discussion of risks verus benefits, the patient would like to opt out of nodal irradiation, based on the findings of the randomized phase  III NSABP study.  Today we discussed the natural course of breast cancer. We highlighted the role of radiotherapy in the management and focused on the details of logistics and delivery. We reviewed the anticipated acute and late sequelae associated with radiation in this setting. The patient was encouraged to ask questions that I answered to the best of my ability. Patient expressed readiness to proceed with treatment. A consent form was reviewed and signed today.     Plan:  Patient is scheduled for CT simulation later today. Will plan to begin radiation approximately one week after simulation. Plan to deliver 50 Gy in 25 fractions to the right breast followed by a  boost to the lumpectomy cavity of 12 Gy, as the patient's in the NSABP study's Treatment Arm Group 1.   -----------------------------------   Julio Ohm, PA-C   Noralee Beam, PhD, MD   [facility]  Radiation Oncology Direct Dial: [phone]  Fax: [phone] [email or web address]     This document serves as a  record of services personally performed by Retta Caster, MD and Julio Ohm, PA-C. It was created on their behalf by Lucky Sable, a trained medical scribe. The creation of this record is based on the scribe's personal observations and the provider's statements to them. This document has been checked and approved by the attending provider.

## 2023-06-27 NOTE — Progress Notes (Signed)
 Location of Breast Cancer:   Malignant neoplasm of upper-outer quadrant of right breast in female, estrogen receptor negative (HCC)   Histology per Pathology Report:    Receptor Status: ER(-), PR (-), Her2-neu (3+), Ki-67(30)  Did patient present with symptoms (if so, please note symptoms) or was this found on screening mammography?:   Initial Diagnosis     Palpable right breast lump (irregular density) by ultrasound 1.3 cm at 9:30 position, ultrasound-guided biopsy: Grade 2 IDC no LVI, ER 0%, PR 0%, Ki67 30%, HER2 3+ positive, 1 axillary lymph node with cortical thickening biopsy: Positive      Past/Anticipated interventions by surgeon, if any: Procedure: Bracketed right breast seed localized lumpectomy with targeted right axillary deep lymph node biopsy with radioactive seed, right axillary sentinel lymph node mapping using mag trace   Past/Anticipated interventions by medical oncology, if any:   Chemotherapy     Patient is on Treatment Plan : BREAST  Docetaxel  + Carboplatin  + Trastuzumab  + Pertuzumab   (TCHP) q21d    Lymphedema issues, if any:   Denies  Pain issues, if any:  Denies  SAFETY ISSUES: Prior radiation? No Pacemaker/ICD? no Possible current pregnancy?no Is the patient on methotrexate? no      BP (!) 146/99 (BP Location: Left Arm, Patient Position: Sitting)   Pulse 92   Temp (!) 96.9 F (36.1 C)   Resp 18   Ht 5' 6 (1.676 m)   Wt 287 lb (130.2 kg)   SpO2 100%   BMI 46.32 kg/m

## 2023-06-28 ENCOUNTER — Ambulatory Visit
Admission: RE | Admit: 2023-06-28 | Discharge: 2023-06-28 | Disposition: A | Discharge status: Home / Self Care | Source: Ambulatory Visit | Attending: Radiation Oncology | Admitting: Radiation Oncology

## 2023-06-28 ENCOUNTER — Encounter: Payer: Self-pay | Admitting: Radiation Oncology

## 2023-06-28 ENCOUNTER — Ambulatory Visit

## 2023-06-28 VITALS — BP 146/99 | HR 92 | Temp 96.9°F | Resp 18 | Ht 66.0 in | Wt 287.0 lb

## 2023-06-28 VITALS — BP 147/95 | HR 89 | Temp 96.8°F | Resp 18 | Ht 66.0 in | Wt 287.0 lb

## 2023-06-28 DIAGNOSIS — Z7952 Long term (current) use of systemic steroids: Secondary | ICD-10-CM | POA: Insufficient documentation

## 2023-06-28 DIAGNOSIS — C50411 Malignant neoplasm of upper-outer quadrant of right female breast: Secondary | ICD-10-CM | POA: Diagnosis not present

## 2023-06-28 DIAGNOSIS — Z171 Estrogen receptor negative status [ER-]: Secondary | ICD-10-CM | POA: Insufficient documentation

## 2023-06-28 DIAGNOSIS — Z79899 Other long term (current) drug therapy: Secondary | ICD-10-CM | POA: Insufficient documentation

## 2023-06-28 DIAGNOSIS — Z5112 Encounter for antineoplastic immunotherapy: Secondary | ICD-10-CM | POA: Insufficient documentation

## 2023-06-28 DIAGNOSIS — Z510 Encounter for antineoplastic radiation therapy: Secondary | ICD-10-CM | POA: Diagnosis not present

## 2023-06-28 LAB — PREGNANCY, URINE: Preg Test, Ur: NEGATIVE

## 2023-07-02 ENCOUNTER — Encounter: Payer: Self-pay | Admitting: *Deleted

## 2023-07-04 DIAGNOSIS — Z510 Encounter for antineoplastic radiation therapy: Secondary | ICD-10-CM | POA: Diagnosis not present

## 2023-07-04 DIAGNOSIS — Z171 Estrogen receptor negative status [ER-]: Secondary | ICD-10-CM | POA: Diagnosis not present

## 2023-07-04 DIAGNOSIS — C50411 Malignant neoplasm of upper-outer quadrant of right female breast: Secondary | ICD-10-CM | POA: Diagnosis not present

## 2023-07-04 DIAGNOSIS — Z5112 Encounter for antineoplastic immunotherapy: Secondary | ICD-10-CM | POA: Diagnosis not present

## 2023-07-05 ENCOUNTER — Other Ambulatory Visit: Payer: Self-pay

## 2023-07-05 ENCOUNTER — Inpatient Hospital Stay: Admitting: Adult Health

## 2023-07-05 ENCOUNTER — Inpatient Hospital Stay: Attending: Hematology and Oncology

## 2023-07-05 ENCOUNTER — Inpatient Hospital Stay

## 2023-07-05 ENCOUNTER — Ambulatory Visit
Admission: RE | Admit: 2023-07-05 | Discharge: 2023-07-05 | Disposition: A | Discharge status: Home / Self Care | Source: Ambulatory Visit | Attending: Radiation Oncology | Admitting: Radiation Oncology

## 2023-07-05 VITALS — BP 138/88 | HR 78 | Temp 98.2°F | Resp 18 | Wt 287.0 lb

## 2023-07-05 DIAGNOSIS — Z5112 Encounter for antineoplastic immunotherapy: Secondary | ICD-10-CM | POA: Diagnosis not present

## 2023-07-05 DIAGNOSIS — C50411 Malignant neoplasm of upper-outer quadrant of right female breast: Secondary | ICD-10-CM | POA: Insufficient documentation

## 2023-07-05 DIAGNOSIS — Z510 Encounter for antineoplastic radiation therapy: Secondary | ICD-10-CM | POA: Diagnosis not present

## 2023-07-05 DIAGNOSIS — Z171 Estrogen receptor negative status [ER-]: Secondary | ICD-10-CM | POA: Diagnosis not present

## 2023-07-05 LAB — RAD ONC ARIA SESSION SUMMARY
Course Elapsed Days: 0
Plan Fractions Treated to Date: 1
Plan Prescribed Dose Per Fraction: 2 Gy
Plan Total Fractions Prescribed: 25
Plan Total Prescribed Dose: 50 Gy
Reference Point Dosage Given to Date: 2 Gy
Reference Point Session Dosage Given: 2 Gy
Session Number: 1

## 2023-07-05 MED ORDER — SODIUM CHLORIDE 0.9 % IV SOLN: INTRAVENOUS | Status: DC

## 2023-07-05 MED ORDER — SODIUM CHLORIDE 0.9 % IV SOLN
420.0000 mg | Freq: Once | INTRAVENOUS | Status: AC
  Administered 2023-07-05: 420 mg via INTRAVENOUS
  Filled 2023-07-05: qty 14

## 2023-07-05 MED ORDER — ACETAMINOPHEN 325 MG PO TABS
650.0000 mg | ORAL_TABLET | Freq: Once | ORAL | Status: AC
  Administered 2023-07-05: 650 mg via ORAL
  Filled 2023-07-05: qty 2

## 2023-07-05 MED ORDER — DIPHENHYDRAMINE HCL 25 MG PO CAPS
50.0000 mg | ORAL_CAPSULE | Freq: Once | ORAL | Status: AC
  Administered 2023-07-05: 50 mg via ORAL
  Filled 2023-07-05: qty 2

## 2023-07-05 MED ORDER — TRASTUZUMAB-ANNS CHEMO 150 MG IV SOLR
750.0000 mg | Freq: Once | INTRAVENOUS | Status: AC
  Administered 2023-07-05: 750 mg via INTRAVENOUS
  Filled 2023-07-05: qty 35.71

## 2023-07-05 NOTE — Patient Instructions (Signed)
 [facility] - A DEPT OF [facility]  Discharge Instructions: Thank you for choosing [facility] to provide your oncology and hematology care.   If you have a lab appointment with the Cancer Center, please go directly to the Cancer Center and check in at the registration area.   Wear comfortable clothing and clothing appropriate for easy access to any Portacath or PICC line.   We strive to give you quality time with your provider. You may need to reschedule your appointment if you arrive late (15 or more minutes).  Arriving late affects you and other patients whose appointments are after yours.  Also, if you miss three or more appointments without notifying the office, you may be dismissed from the clinic at the provider's discretion.      For prescription refill requests, have your pharmacy contact our office and allow 72 hours for refills to be completed.    Today you received the following chemotherapy and/or immunotherapy agents: trastuzumab  and pertuzumab       To help prevent nausea and vomiting after your treatment, we encourage you to take your nausea medication as directed.  BELOW ARE SYMPTOMS THAT SHOULD BE REPORTED IMMEDIATELY: *FEVER GREATER THAN 100.4 F (38 C) OR HIGHER *CHILLS OR SWEATING *NAUSEA AND VOMITING THAT IS NOT CONTROLLED WITH YOUR NAUSEA MEDICATION *UNUSUAL SHORTNESS OF BREATH *UNUSUAL BRUISING OR BLEEDING *URINARY PROBLEMS (pain or burning when urinating, or frequent urination) *BOWEL PROBLEMS (unusual diarrhea, constipation, pain near the anus) TENDERNESS IN MOUTH AND THROAT WITH OR WITHOUT PRESENCE OF ULCERS (sore throat, sores in mouth, or a toothache) UNUSUAL RASH, SWELLING OR PAIN  UNUSUAL VAGINAL DISCHARGE OR ITCHING   Items with * indicate a potential emergency and should be followed up as soon as possible or go to the Emergency Department if any problems should occur.  Please show the CHEMOTHERAPY ALERT CARD  or IMMUNOTHERAPY ALERT CARD at check-in to the Emergency Department and triage nurse.  Should you have questions after your visit or need to cancel or reschedule your appointment, please contact [facility] - A DEPT OF [facility]  Dept: [phone]  and follow the prompts.  Office hours are 8:00 a.m. to 4:30 p.m. Monday - Friday. Please note that voicemails left after 4:00 p.m. may not be returned until the following business day.  We are closed weekends and major holidays. You have access to a nurse at all times for urgent questions. Please call the main number to the clinic Dept: [phone] and follow the prompts.   For any non-urgent questions, you may also contact your provider using MyChart. We now offer e-Visits for anyone 30 and older to request care online for non-urgent symptoms. For details visit [email or web address].   Also download the MyChart app! Go to the app store, search "MyChart", open the app, select [facility], and log in with your MyChart username and password.

## 2023-07-06 ENCOUNTER — Ambulatory Visit
Admission: RE | Admit: 2023-07-06 | Discharge: 2023-07-06 | Disposition: A | Discharge status: Home / Self Care | Source: Ambulatory Visit | Attending: Radiation Oncology | Admitting: Radiation Oncology

## 2023-07-06 ENCOUNTER — Other Ambulatory Visit: Payer: Self-pay

## 2023-07-06 DIAGNOSIS — C50411 Malignant neoplasm of upper-outer quadrant of right female breast: Secondary | ICD-10-CM | POA: Diagnosis not present

## 2023-07-06 DIAGNOSIS — Z171 Estrogen receptor negative status [ER-]: Secondary | ICD-10-CM | POA: Diagnosis not present

## 2023-07-06 DIAGNOSIS — Z5112 Encounter for antineoplastic immunotherapy: Secondary | ICD-10-CM | POA: Diagnosis not present

## 2023-07-06 DIAGNOSIS — Z510 Encounter for antineoplastic radiation therapy: Secondary | ICD-10-CM | POA: Diagnosis not present

## 2023-07-06 LAB — RAD ONC ARIA SESSION SUMMARY
Course Elapsed Days: 1
Plan Fractions Treated to Date: 2
Plan Prescribed Dose Per Fraction: 2 Gy
Plan Total Fractions Prescribed: 25
Plan Total Prescribed Dose: 50 Gy
Reference Point Dosage Given to Date: 4 Gy
Reference Point Session Dosage Given: 2 Gy
Session Number: 2

## 2023-07-08 ENCOUNTER — Telehealth: Admitting: Family Medicine

## 2023-07-08 DIAGNOSIS — J301 Allergic rhinitis due to pollen: Secondary | ICD-10-CM

## 2023-07-08 MED ORDER — NOREL AD 4-10-325 MG PO TABS: 1.0000 | ORAL_TABLET | ORAL | 0 refills | Status: AC | PRN

## 2023-07-08 NOTE — Patient Instructions (Signed)

## 2023-07-08 NOTE — Progress Notes (Signed)
 Virtual Visit Consent   Kara Webb, you are scheduled for a virtual visit with a [facility] provider today. Just as with appointments in the office, your consent must be obtained to participate. Your consent will be active for this visit and any virtual visit you may have with one of our providers in the next 365 days. If you have a MyChart account, a copy of this consent can be sent to you electronically.  As this is a virtual visit, video technology does not allow for your provider to perform a traditional examination. This may limit your provider's ability to fully assess your condition. If your provider identifies any concerns that need to be evaluated in person or the need to arrange testing (such as labs, EKG, etc.), we will make arrangements to do so. Although advances in technology are sophisticated, we cannot ensure that it will always work on either your end or our end. If the connection with a video visit is poor, the visit may have to be switched to a telephone visit. With either a video or telephone visit, we are not always able to ensure that we have a secure connection.  By engaging in this virtual visit, you consent to the provision of healthcare and authorize for your insurance to be billed (if applicable) for the services provided during this visit. Depending on your insurance coverage, you may receive a charge related to this service.  I need to obtain your verbal consent now. Are you willing to proceed with your visit today? Takoda Siedlecki has provided verbal consent on 07/08/2023 for a virtual visit (video or telephone). Loa Lamp, FNP  Date: 07/08/2023 9:23 AM   Virtual Visit via Video Note   I, Loa Lamp, connected with  Bijal Siglin  ([id number], 12-Nov-1980) on 07/08/23 at  9:15 AM EDT by a video-enabled telemedicine application and verified that I am speaking with the correct person using two identifiers.  Location: Patient: Virtual Visit Location Patient:  Home Provider: Virtual Visit Location Provider: Home Office   I discussed the limitations of evaluation and management by telemedicine and the availability of in person appointments. The patient expressed understanding and agreed to proceed.    History of Present Illness: Kara Webb is a 43 y.o. who identifies as a female who was assigned female at birth, and is being seen today for head congestion- feels like allergies. Took sudafed d with no results. Recently finished chemo in April. SABRA  HPI: HPI  Problems:  Patient Active Problem List   Diagnosis Date Noted   Port-A-Cath in place 01/12/2023   Genetic testing 01/12/2023   Family history of breast cancer 12/27/2022   Family history of colon cancer 12/27/2022   Malignant neoplasm of upper-outer quadrant of right breast in female, estrogen receptor negative (HCC) 12/25/2022    Allergies:  Allergies  Allergen Reactions   Emend [Fosaprepitant  Dimeglumine] Shortness Of Breath    Approximately 3 min into Emend infusion, pt started to c/o abdominal pain & shortness of breath. Facial flushing noted. Infusion stopped, 1L IVF initiated, 20mg  Pepcid  given. Pt states she feels much better. Emend infusion d/c. VSS throughout. See flowsheets, notes and [facility] 01/12/23.   Medications:  Current Outpatient Medications:    Chlorphen-PE-Acetaminophen  (NOREL AD) 4-10-325 MG TABS, Take 1 tablet by mouth every 4 (four) hours as needed for up to 10 days., Disp: 30 tablet, Rfl: 0   ALPRAZolam  (XANAX ) 0.5 MG tablet, Take 1 tablet (0.5 mg total) by mouth at bedtime as needed for  anxiety or sleep., Disp: 20 tablet, Rfl: 0   azithromycin  (ZITHROMAX  Z-PAK) 250 MG tablet, Per instructions. Only take if symptoms worsen and flu/covid negative, notify cancer center prior to starting (Patient not taking: Reported on 06/28/2023), Disp: 6 each, Rfl: 0   dexamethasone  (DECADRON ) 4 MG tablet, Take 2 tabs by mouth 2 times daily starting day before chemo. Then take 2 tabs  daily for 2 days starting day after chemo. Take with food. (Patient not taking: Reported on 06/28/2023), Disp: 30 tablet, Rfl: 1   ferrous gluconate  (FERGON) 324 MG tablet, Take 1 tablet (324 mg total) by mouth 2 (two) times daily with a meal., Disp: 60 tablet, Rfl: 6   ibuprofen  (ADVIL ) 800 MG tablet, Take 1 tablet (800 mg total) by mouth every 8 (eight) hours as needed., Disp: 30 tablet, Rfl: 0   IUD'S IU, IUD's, Disp: , Rfl:    lidocaine -prilocaine  (EMLA ) cream, Apply to affected area once, Disp: 30 g, Rfl: 3   ondansetron  (ZOFRAN ) 8 MG tablet, Take 1 tablet (8 mg total) by mouth every 8 (eight) hours as needed for nausea or vomiting. Start on the third day after chemotherapy., Disp: 30 tablet, Rfl: 1   oxyCODONE  (OXY IR/ROXICODONE ) 5 MG immediate release tablet, Take 1 tablet (5 mg total) by mouth every 6 (six) hours as needed for severe pain (pain score 7-10). (Patient not taking: Reported on 06/28/2023), Disp: 15 tablet, Rfl: 0   prochlorperazine  (COMPAZINE ) 10 MG tablet, Take 1 tablet (10 mg total) by mouth every 6 (six) hours as needed for nausea or vomiting. (Patient not taking: Reported on 06/28/2023), Disp: 30 tablet, Rfl: 1  Observations/Objective: Patient is well-developed, well-nourished in no acute distress.  Resting comfortably  at home.  Head is normocephalic, atraumatic.  No labored breathing.  Speech is clear and coherent with logical content.  Patient is alert and oriented at baseline.    Assessment and Plan: 1. Allergic rhinitis due to pollen, unspecified seasonality (Primary)  Increase fuids, continue zyrtec and flonase/ UC as needed.   Follow Up Instructions: I discussed the assessment and treatment plan with the patient. The patient was provided an opportunity to ask questions and all were answered. The patient agreed with the plan and demonstrated an understanding of the instructions.  A copy of instructions were sent to the patient via MyChart unless otherwise noted  below.     The patient was advised to call back or seek an in-person evaluation if the symptoms worsen or if the condition fails to improve as anticipated.    Anani Gu, FNP

## 2023-07-09 ENCOUNTER — Other Ambulatory Visit: Payer: Self-pay

## 2023-07-09 ENCOUNTER — Ambulatory Visit
Admission: RE | Admit: 2023-07-09 | Discharge: 2023-07-09 | Disposition: A | Discharge status: Home / Self Care | Source: Ambulatory Visit | Attending: Radiation Oncology | Admitting: Radiation Oncology

## 2023-07-09 DIAGNOSIS — Z5112 Encounter for antineoplastic immunotherapy: Secondary | ICD-10-CM | POA: Diagnosis not present

## 2023-07-09 DIAGNOSIS — C50411 Malignant neoplasm of upper-outer quadrant of right female breast: Secondary | ICD-10-CM | POA: Diagnosis not present

## 2023-07-09 DIAGNOSIS — Z510 Encounter for antineoplastic radiation therapy: Secondary | ICD-10-CM | POA: Diagnosis not present

## 2023-07-09 DIAGNOSIS — Z171 Estrogen receptor negative status [ER-]: Secondary | ICD-10-CM | POA: Diagnosis not present

## 2023-07-09 LAB — RAD ONC ARIA SESSION SUMMARY
Course Elapsed Days: 4
Plan Fractions Treated to Date: 3
Plan Prescribed Dose Per Fraction: 2 Gy
Plan Total Fractions Prescribed: 25
Plan Total Prescribed Dose: 50 Gy
Reference Point Dosage Given to Date: 6 Gy
Reference Point Session Dosage Given: 2 Gy
Session Number: 3

## 2023-07-10 ENCOUNTER — Other Ambulatory Visit: Payer: Self-pay

## 2023-07-10 ENCOUNTER — Ambulatory Visit
Admission: RE | Admit: 2023-07-10 | Discharge: 2023-07-10 | Disposition: A | Discharge status: Home / Self Care | Source: Ambulatory Visit | Attending: Radiation Oncology | Admitting: Radiation Oncology

## 2023-07-10 DIAGNOSIS — Z5112 Encounter for antineoplastic immunotherapy: Secondary | ICD-10-CM | POA: Diagnosis not present

## 2023-07-10 DIAGNOSIS — Z171 Estrogen receptor negative status [ER-]: Secondary | ICD-10-CM | POA: Diagnosis not present

## 2023-07-10 DIAGNOSIS — C50411 Malignant neoplasm of upper-outer quadrant of right female breast: Secondary | ICD-10-CM | POA: Diagnosis not present

## 2023-07-10 DIAGNOSIS — Z510 Encounter for antineoplastic radiation therapy: Secondary | ICD-10-CM | POA: Diagnosis not present

## 2023-07-10 LAB — RAD ONC ARIA SESSION SUMMARY
Course Elapsed Days: 5
Plan Fractions Treated to Date: 4
Plan Prescribed Dose Per Fraction: 2 Gy
Plan Total Fractions Prescribed: 25
Plan Total Prescribed Dose: 50 Gy
Reference Point Dosage Given to Date: 8 Gy
Reference Point Session Dosage Given: 2 Gy
Session Number: 4

## 2023-07-10 MED ORDER — RADIAPLEXRX EX GEL: Freq: Once | CUTANEOUS | Status: AC

## 2023-07-10 MED ORDER — ALRA NON-METALLIC DEODORANT (RAD-ONC)
1.0000 | Freq: Once | TOPICAL | Status: AC
  Administered 2023-07-10: 1 via TOPICAL

## 2023-07-11 ENCOUNTER — Other Ambulatory Visit: Payer: Self-pay

## 2023-07-11 ENCOUNTER — Ambulatory Visit
Admission: RE | Admit: 2023-07-11 | Discharge: 2023-07-11 | Disposition: A | Discharge status: Home / Self Care | Source: Ambulatory Visit | Attending: Radiation Oncology | Admitting: Radiation Oncology

## 2023-07-11 DIAGNOSIS — Z5112 Encounter for antineoplastic immunotherapy: Secondary | ICD-10-CM | POA: Diagnosis not present

## 2023-07-11 DIAGNOSIS — C50411 Malignant neoplasm of upper-outer quadrant of right female breast: Secondary | ICD-10-CM | POA: Diagnosis not present

## 2023-07-11 DIAGNOSIS — Z510 Encounter for antineoplastic radiation therapy: Secondary | ICD-10-CM | POA: Diagnosis not present

## 2023-07-11 DIAGNOSIS — Z171 Estrogen receptor negative status [ER-]: Secondary | ICD-10-CM | POA: Diagnosis not present

## 2023-07-11 LAB — RAD ONC ARIA SESSION SUMMARY
Course Elapsed Days: 6
Plan Fractions Treated to Date: 5
Plan Prescribed Dose Per Fraction: 2 Gy
Plan Total Fractions Prescribed: 25
Plan Total Prescribed Dose: 50 Gy
Reference Point Dosage Given to Date: 10 Gy
Reference Point Session Dosage Given: 2 Gy
Session Number: 5

## 2023-07-12 ENCOUNTER — Other Ambulatory Visit: Payer: Self-pay

## 2023-07-12 ENCOUNTER — Ambulatory Visit
Admission: RE | Admit: 2023-07-12 | Discharge: 2023-07-12 | Disposition: A | Discharge status: Home / Self Care | Source: Ambulatory Visit | Attending: Radiation Oncology | Admitting: Radiation Oncology

## 2023-07-12 DIAGNOSIS — Z171 Estrogen receptor negative status [ER-]: Secondary | ICD-10-CM | POA: Diagnosis not present

## 2023-07-12 DIAGNOSIS — C50411 Malignant neoplasm of upper-outer quadrant of right female breast: Secondary | ICD-10-CM | POA: Diagnosis not present

## 2023-07-12 DIAGNOSIS — Z5112 Encounter for antineoplastic immunotherapy: Secondary | ICD-10-CM | POA: Diagnosis not present

## 2023-07-12 DIAGNOSIS — Z510 Encounter for antineoplastic radiation therapy: Secondary | ICD-10-CM | POA: Diagnosis not present

## 2023-07-12 LAB — RAD ONC ARIA SESSION SUMMARY
Course Elapsed Days: 7
Plan Fractions Treated to Date: 6
Plan Prescribed Dose Per Fraction: 2 Gy
Plan Total Fractions Prescribed: 25
Plan Total Prescribed Dose: 50 Gy
Reference Point Dosage Given to Date: 12 Gy
Reference Point Session Dosage Given: 2 Gy
Session Number: 6

## 2023-07-13 ENCOUNTER — Ambulatory Visit

## 2023-07-16 ENCOUNTER — Ambulatory Visit
Admission: RE | Admit: 2023-07-16 | Discharge: 2023-07-16 | Disposition: A | Discharge status: Home / Self Care | Source: Ambulatory Visit | Attending: Radiation Oncology | Admitting: Radiation Oncology

## 2023-07-16 ENCOUNTER — Other Ambulatory Visit: Payer: Self-pay

## 2023-07-16 DIAGNOSIS — Z171 Estrogen receptor negative status [ER-]: Secondary | ICD-10-CM | POA: Diagnosis not present

## 2023-07-16 DIAGNOSIS — Z510 Encounter for antineoplastic radiation therapy: Secondary | ICD-10-CM | POA: Diagnosis not present

## 2023-07-16 DIAGNOSIS — C50411 Malignant neoplasm of upper-outer quadrant of right female breast: Secondary | ICD-10-CM | POA: Diagnosis not present

## 2023-07-16 DIAGNOSIS — Z5112 Encounter for antineoplastic immunotherapy: Secondary | ICD-10-CM | POA: Diagnosis not present

## 2023-07-16 LAB — RAD ONC ARIA SESSION SUMMARY
Course Elapsed Days: 11
Plan Fractions Treated to Date: 7
Plan Prescribed Dose Per Fraction: 2 Gy
Plan Total Fractions Prescribed: 25
Plan Total Prescribed Dose: 50 Gy
Reference Point Dosage Given to Date: 14 Gy
Reference Point Session Dosage Given: 2 Gy
Session Number: 7

## 2023-07-17 ENCOUNTER — Ambulatory Visit
Admission: RE | Admit: 2023-07-17 | Discharge: 2023-07-17 | Disposition: A | Discharge status: Home / Self Care | Source: Ambulatory Visit | Attending: Radiation Oncology | Admitting: Radiation Oncology

## 2023-07-17 ENCOUNTER — Other Ambulatory Visit: Payer: Self-pay

## 2023-07-17 DIAGNOSIS — R5383 Other fatigue: Secondary | ICD-10-CM | POA: Insufficient documentation

## 2023-07-17 DIAGNOSIS — R42 Dizziness and giddiness: Secondary | ICD-10-CM | POA: Diagnosis not present

## 2023-07-17 DIAGNOSIS — R531 Weakness: Secondary | ICD-10-CM | POA: Diagnosis not present

## 2023-07-17 DIAGNOSIS — C50411 Malignant neoplasm of upper-outer quadrant of right female breast: Secondary | ICD-10-CM | POA: Diagnosis present

## 2023-07-17 DIAGNOSIS — Z1731 Human epidermal growth factor receptor 2 positive status: Secondary | ICD-10-CM | POA: Insufficient documentation

## 2023-07-17 DIAGNOSIS — Z510 Encounter for antineoplastic radiation therapy: Secondary | ICD-10-CM | POA: Insufficient documentation

## 2023-07-17 DIAGNOSIS — N920 Excessive and frequent menstruation with regular cycle: Secondary | ICD-10-CM | POA: Diagnosis not present

## 2023-07-17 DIAGNOSIS — D259 Leiomyoma of uterus, unspecified: Secondary | ICD-10-CM | POA: Insufficient documentation

## 2023-07-17 DIAGNOSIS — Z923 Personal history of irradiation: Secondary | ICD-10-CM | POA: Diagnosis not present

## 2023-07-17 DIAGNOSIS — Z1722 Progesterone receptor negative status: Secondary | ICD-10-CM | POA: Insufficient documentation

## 2023-07-17 DIAGNOSIS — Z5111 Encounter for antineoplastic chemotherapy: Secondary | ICD-10-CM | POA: Diagnosis not present

## 2023-07-17 DIAGNOSIS — Z79899 Other long term (current) drug therapy: Secondary | ICD-10-CM | POA: Diagnosis not present

## 2023-07-17 DIAGNOSIS — Z171 Estrogen receptor negative status [ER-]: Secondary | ICD-10-CM | POA: Insufficient documentation

## 2023-07-17 LAB — RAD ONC ARIA SESSION SUMMARY
Course Elapsed Days: 12
Plan Fractions Treated to Date: 8
Plan Prescribed Dose Per Fraction: 2 Gy
Plan Total Fractions Prescribed: 25
Plan Total Prescribed Dose: 50 Gy
Reference Point Dosage Given to Date: 16 Gy
Reference Point Session Dosage Given: 2 Gy
Session Number: 8

## 2023-07-18 ENCOUNTER — Telehealth: Payer: Self-pay | Admitting: *Deleted

## 2023-07-18 ENCOUNTER — Other Ambulatory Visit: Payer: Self-pay

## 2023-07-18 ENCOUNTER — Ambulatory Visit
Admission: RE | Admit: 2023-07-18 | Discharge: 2023-07-18 | Disposition: A | Discharge status: Home / Self Care | Source: Ambulatory Visit | Attending: Radiation Oncology | Admitting: Radiation Oncology

## 2023-07-18 DIAGNOSIS — C50411 Malignant neoplasm of upper-outer quadrant of right female breast: Secondary | ICD-10-CM | POA: Diagnosis not present

## 2023-07-18 LAB — RAD ONC ARIA SESSION SUMMARY
Course Elapsed Days: 13
Plan Fractions Treated to Date: 9
Plan Prescribed Dose Per Fraction: 2 Gy
Plan Total Fractions Prescribed: 25
Plan Total Prescribed Dose: 50 Gy
Reference Point Dosage Given to Date: 18 Gy
Reference Point Session Dosage Given: 2 Gy
Session Number: 9

## 2023-07-18 NOTE — Telephone Encounter (Signed)
 Received call from pt with cold symptoms.  Pt states she has tried OTC medications with no relief.  Pt states Lacie, NP prescribed her a Zpak and wanted to ensure it was okay to proceed with taking.  Per MD okay to proceed, pt verbalized understanding.

## 2023-07-19 ENCOUNTER — Other Ambulatory Visit: Payer: Self-pay

## 2023-07-19 ENCOUNTER — Ambulatory Visit
Admission: RE | Admit: 2023-07-19 | Discharge: 2023-07-19 | Disposition: A | Discharge status: Home / Self Care | Source: Ambulatory Visit | Attending: Radiation Oncology | Admitting: Radiation Oncology

## 2023-07-19 DIAGNOSIS — C50411 Malignant neoplasm of upper-outer quadrant of right female breast: Secondary | ICD-10-CM | POA: Diagnosis not present

## 2023-07-19 LAB — RAD ONC ARIA SESSION SUMMARY
Course Elapsed Days: 14
Plan Fractions Treated to Date: 10
Plan Prescribed Dose Per Fraction: 2 Gy
Plan Total Fractions Prescribed: 25
Plan Total Prescribed Dose: 50 Gy
Reference Point Dosage Given to Date: 20 Gy
Reference Point Session Dosage Given: 2 Gy
Session Number: 10

## 2023-07-23 ENCOUNTER — Ambulatory Visit
Admission: RE | Admit: 2023-07-23 | Discharge: 2023-07-23 | Disposition: A | Discharge status: Home / Self Care | Source: Ambulatory Visit | Attending: Radiation Oncology | Admitting: Radiation Oncology

## 2023-07-23 ENCOUNTER — Other Ambulatory Visit: Payer: Self-pay

## 2023-07-23 DIAGNOSIS — C50411 Malignant neoplasm of upper-outer quadrant of right female breast: Secondary | ICD-10-CM | POA: Diagnosis not present

## 2023-07-23 LAB — RAD ONC ARIA SESSION SUMMARY
Course Elapsed Days: 18
Plan Fractions Treated to Date: 11
Plan Prescribed Dose Per Fraction: 2 Gy
Plan Total Fractions Prescribed: 25
Plan Total Prescribed Dose: 50 Gy
Reference Point Dosage Given to Date: 22 Gy
Reference Point Session Dosage Given: 2 Gy
Session Number: 11

## 2023-07-24 ENCOUNTER — Ambulatory Visit
Admission: RE | Admit: 2023-07-24 | Discharge: 2023-07-24 | Disposition: A | Discharge status: Home / Self Care | Source: Ambulatory Visit | Attending: Radiation Oncology | Admitting: Radiation Oncology

## 2023-07-24 ENCOUNTER — Other Ambulatory Visit: Payer: Self-pay

## 2023-07-24 DIAGNOSIS — C50411 Malignant neoplasm of upper-outer quadrant of right female breast: Secondary | ICD-10-CM | POA: Diagnosis not present

## 2023-07-24 LAB — RAD ONC ARIA SESSION SUMMARY
Course Elapsed Days: 19
Plan Fractions Treated to Date: 12
Plan Prescribed Dose Per Fraction: 2 Gy
Plan Total Fractions Prescribed: 25
Plan Total Prescribed Dose: 50 Gy
Reference Point Dosage Given to Date: 24 Gy
Reference Point Session Dosage Given: 2 Gy
Session Number: 12

## 2023-07-25 ENCOUNTER — Ambulatory Visit
Admission: RE | Admit: 2023-07-25 | Discharge: 2023-07-25 | Disposition: A | Discharge status: Home / Self Care | Source: Ambulatory Visit | Attending: Radiation Oncology | Admitting: Radiation Oncology

## 2023-07-25 ENCOUNTER — Other Ambulatory Visit: Payer: Self-pay

## 2023-07-25 DIAGNOSIS — Z510 Encounter for antineoplastic radiation therapy: Secondary | ICD-10-CM | POA: Diagnosis not present

## 2023-07-25 DIAGNOSIS — C50411 Malignant neoplasm of upper-outer quadrant of right female breast: Secondary | ICD-10-CM | POA: Diagnosis not present

## 2023-07-25 DIAGNOSIS — Z171 Estrogen receptor negative status [ER-]: Secondary | ICD-10-CM | POA: Diagnosis not present

## 2023-07-25 LAB — RAD ONC ARIA SESSION SUMMARY
Course Elapsed Days: 20
Plan Fractions Treated to Date: 13
Plan Prescribed Dose Per Fraction: 2 Gy
Plan Total Fractions Prescribed: 25
Plan Total Prescribed Dose: 50 Gy
Reference Point Dosage Given to Date: 26 Gy
Reference Point Session Dosage Given: 2 Gy
Session Number: 13

## 2023-07-25 NOTE — Assessment & Plan Note (Signed)
 12/18/2022:Palpable right breast lump (irregular density) by ultrasound 1.3 cm at 9:30 position, ultrasound-guided biopsy: Grade 2 IDC no LVI, ER 0%, PR 0%, Ki67 30%, HER2 3+ positive, 1 axillary lymph node with cortical thickening biopsy: Positive    Breast MRI 01/08/2023: 1.6 cm mass, 2.4 cm non-mass enhancement: Biopsy: DCIS   Treatment plan: Neoadjuvant chemotherapy with [facility] Perjeta  01/12/2023-04/26/2023 06/05/2023: Right lumpectomy: Negative for residual cancer, pathologic complete response 0/1 lymph node negative Adjuvant radiation therapy ----------------------------------------------------------------------------------------------------------------------------------------------- Current treatment: Herceptin  and Perjeta  maintenance   Return to clinic every 3 weeks for Herceptin  and Perjeta  every 6 weeks and follow-up with me

## 2023-07-26 ENCOUNTER — Ambulatory Visit
Admission: RE | Admit: 2023-07-26 | Discharge: 2023-07-26 | Disposition: A | Discharge status: Home / Self Care | Source: Ambulatory Visit | Attending: Radiation Oncology | Admitting: Radiation Oncology

## 2023-07-26 ENCOUNTER — Inpatient Hospital Stay: Attending: Hematology and Oncology

## 2023-07-26 ENCOUNTER — Inpatient Hospital Stay

## 2023-07-26 ENCOUNTER — Other Ambulatory Visit: Payer: Self-pay

## 2023-07-26 ENCOUNTER — Inpatient Hospital Stay (HOSPITAL_BASED_OUTPATIENT_CLINIC_OR_DEPARTMENT_OTHER): Admitting: Hematology and Oncology

## 2023-07-26 VITALS — BP 136/92 | HR 80 | Resp 18

## 2023-07-26 VITALS — BP 148/100 | HR 85 | Temp 97.0°F | Resp 18 | Ht 66.0 in | Wt 291.7 lb

## 2023-07-26 DIAGNOSIS — Z79899 Other long term (current) drug therapy: Secondary | ICD-10-CM | POA: Insufficient documentation

## 2023-07-26 DIAGNOSIS — Z171 Estrogen receptor negative status [ER-]: Secondary | ICD-10-CM

## 2023-07-26 DIAGNOSIS — Z5111 Encounter for antineoplastic chemotherapy: Secondary | ICD-10-CM | POA: Insufficient documentation

## 2023-07-26 DIAGNOSIS — C50411 Malignant neoplasm of upper-outer quadrant of right female breast: Secondary | ICD-10-CM | POA: Insufficient documentation

## 2023-07-26 DIAGNOSIS — D259 Leiomyoma of uterus, unspecified: Secondary | ICD-10-CM | POA: Insufficient documentation

## 2023-07-26 DIAGNOSIS — R42 Dizziness and giddiness: Secondary | ICD-10-CM | POA: Insufficient documentation

## 2023-07-26 DIAGNOSIS — N920 Excessive and frequent menstruation with regular cycle: Secondary | ICD-10-CM | POA: Insufficient documentation

## 2023-07-26 DIAGNOSIS — Z1731 Human epidermal growth factor receptor 2 positive status: Secondary | ICD-10-CM | POA: Insufficient documentation

## 2023-07-26 DIAGNOSIS — Z95828 Presence of other vascular implants and grafts: Secondary | ICD-10-CM

## 2023-07-26 DIAGNOSIS — Z923 Personal history of irradiation: Secondary | ICD-10-CM | POA: Insufficient documentation

## 2023-07-26 DIAGNOSIS — Z5181 Encounter for therapeutic drug level monitoring: Secondary | ICD-10-CM

## 2023-07-26 DIAGNOSIS — R5383 Other fatigue: Secondary | ICD-10-CM | POA: Insufficient documentation

## 2023-07-26 DIAGNOSIS — Z1722 Progesterone receptor negative status: Secondary | ICD-10-CM | POA: Insufficient documentation

## 2023-07-26 DIAGNOSIS — R531 Weakness: Secondary | ICD-10-CM | POA: Insufficient documentation

## 2023-07-26 LAB — CBC WITH DIFFERENTIAL (CANCER CENTER ONLY)
Abs Immature Granulocytes: 0 K/uL (ref 0.00–0.07)
Basophils Absolute: 0 K/uL (ref 0.0–0.1)
Basophils Relative: 0 %
Eosinophils Absolute: 0.1 K/uL (ref 0.0–0.5)
Eosinophils Relative: 2 %
HCT: 35.6 % — ABNORMAL LOW (ref 36.0–46.0)
Hemoglobin: 11.4 g/dL — ABNORMAL LOW (ref 12.0–15.0)
Immature Granulocytes: 0 %
Lymphocytes Relative: 32 %
Lymphs Abs: 1 K/uL (ref 0.7–4.0)
MCH: 25.9 pg — ABNORMAL LOW (ref 26.0–34.0)
MCHC: 32 g/dL (ref 30.0–36.0)
MCV: 80.7 fL (ref 80.0–100.0)
Monocytes Absolute: 0.4 K/uL (ref 0.1–1.0)
Monocytes Relative: 12 %
Neutro Abs: 1.6 K/uL — ABNORMAL LOW (ref 1.7–7.7)
Neutrophils Relative %: 54 %
Platelet Count: 167 K/uL (ref 150–400)
RBC: 4.41 MIL/uL (ref 3.87–5.11)
RDW: 15.4 % (ref 11.5–15.5)
WBC Count: 3 K/uL — ABNORMAL LOW (ref 4.0–10.5)
nRBC: 0 % (ref 0.0–0.2)

## 2023-07-26 LAB — RAD ONC ARIA SESSION SUMMARY
Course Elapsed Days: 21
Plan Fractions Treated to Date: 14
Plan Prescribed Dose Per Fraction: 2 Gy
Plan Total Fractions Prescribed: 25
Plan Total Prescribed Dose: 50 Gy
Reference Point Dosage Given to Date: 28 Gy
Reference Point Session Dosage Given: 2 Gy
Session Number: 14

## 2023-07-26 LAB — CMP (CANCER CENTER ONLY)
ALT: 44 U/L (ref 0–44)
AST: 24 U/L (ref 15–41)
Albumin: 3.7 g/dL (ref 3.5–5.0)
Alkaline Phosphatase: 88 U/L (ref 38–126)
Anion gap: 3 — ABNORMAL LOW (ref 5–15)
BUN: 12 mg/dL (ref 6–20)
CO2: 29 mmol/L (ref 22–32)
Calcium: 9.1 mg/dL (ref 8.9–10.3)
Chloride: 106 mmol/L (ref 98–111)
Creatinine: 0.83 mg/dL (ref 0.44–1.00)
GFR, Estimated: >60 mL/min (ref >60)
Glucose, Bld: 86 mg/dL (ref 70–99)
Potassium: 3.7 mmol/L (ref 3.5–5.1)
Sodium: 138 mmol/L (ref 135–145)
Total Bilirubin: 0.2 mg/dL (ref 0.0–1.2)
Total Protein: 7.2 g/dL (ref 6.5–8.1)

## 2023-07-26 LAB — PREGNANCY, URINE: Preg Test, Ur: NEGATIVE

## 2023-07-26 MED ORDER — ACETAMINOPHEN 325 MG PO TABS
650.0000 mg | ORAL_TABLET | Freq: Once | ORAL | Status: AC
  Administered 2023-07-26: 650 mg via ORAL
  Filled 2023-07-26: qty 2

## 2023-07-26 MED ORDER — HEPARIN SOD (PORK) LOCK FLUSH 100 UNIT/ML IV SOLN: 500.0000 [IU] | Freq: Once | INTRAVENOUS | Status: DC | PRN

## 2023-07-26 MED ORDER — SODIUM CHLORIDE 0.9% FLUSH: 10.0000 mL | INTRAVENOUS | Status: DC | PRN

## 2023-07-26 MED ORDER — SODIUM CHLORIDE 0.9 % IV SOLN
420.0000 mg | Freq: Once | INTRAVENOUS | Status: AC
  Administered 2023-07-26: 420 mg via INTRAVENOUS
  Filled 2023-07-26: qty 14

## 2023-07-26 MED ORDER — DIPHENHYDRAMINE HCL 25 MG PO CAPS
50.0000 mg | ORAL_CAPSULE | Freq: Once | ORAL | Status: AC
Start: 2023-07-26 — End: 2023-07-26
  Administered 2023-07-26: 50 mg via ORAL
  Filled 2023-07-26: qty 2

## 2023-07-26 MED ORDER — SODIUM CHLORIDE 0.9 % IV SOLN
INTRAVENOUS | Status: DC
Start: 2023-07-26 — End: 2023-07-26

## 2023-07-26 MED ORDER — TRASTUZUMAB-ANNS CHEMO 150 MG IV SOLR
750.0000 mg | Freq: Once | INTRAVENOUS | Status: AC
  Administered 2023-07-26: 750 mg via INTRAVENOUS
  Filled 2023-07-26: qty 35.71

## 2023-07-26 MED ORDER — SODIUM CHLORIDE 0.9% FLUSH
10.0000 mL | Freq: Once | INTRAVENOUS | Status: AC
Start: 2023-07-26 — End: 2023-07-26
  Administered 2023-07-26: 10 mL

## 2023-07-26 NOTE — Progress Notes (Signed)
 Per Brandi/ PA team  Authorization for contiuation of kanjinti /perjeta  still pending. OK to proceed with therapy today    Maudie FORBES Andreas, PharmD, BCPS Clinical Pharmacist

## 2023-07-26 NOTE — Progress Notes (Deleted)
 Kara Webb presents today for phlebotomy per MD orders. Phlebotomy procedure started at 1645 and ended at 1653. 500 grams removed. Patient observed for 30 minutes after procedure without any incident. Patient tolerated procedure well.  Food and drink provided IV needle removed intact.

## 2023-07-26 NOTE — Progress Notes (Signed)
 Patient Care Team: Ilah Crigler, MD as PCP - General (Family Medicine) Tyree Nanetta SAILOR, RN as Oncology Nurse Navigator Glean, Stephane BROCKS, RN (Inactive) as Oncology Nurse Navigator Shannon Agent, MD as Consulting Physician (Radiation Oncology) Odean Potts, MD as Consulting Physician (Hematology and Oncology) Vanderbilt Ned, MD as Consulting Physician (General Surgery)  DIAGNOSIS:  Encounter Diagnosis  Name Primary?   Malignant neoplasm of upper-outer quadrant of right breast in female, estrogen receptor negative (HCC) Yes    SUMMARY OF ONCOLOGIC HISTORY: Oncology History  Malignant neoplasm of upper-outer quadrant of right breast in female, estrogen receptor negative (HCC)  12/18/2022 Initial Diagnosis   Palpable right breast lump (irregular density) by ultrasound 1.3 cm at 9:30 position, ultrasound-guided biopsy: Grade 2 IDC no LVI, ER 0%, PR 0%, Ki67 30%, HER2 3+ positive, 1 axillary lymph node with cortical thickening biopsy: Positive   12/27/2022 Cancer Staging   Staging form: Breast, AJCC 8th Edition - Clinical: Stage IIA (cT1c, cN1, cM0, G2, ER-, PR-, HER2+) - Signed by Odean Potts, MD on 12/27/2022 Stage prefix: Initial diagnosis Histologic grading system: 3 grade system   01/11/2023 Genetic Testing   Negative [facility] CancerNext +RNAinsight Panel.  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 Report date is 01/11/2023.   The [employer] CancerNext+RNAinsight Panel includes sequencing, rearrangement analysis, and RNA analysis for the following 39 genes: APC, ATM, BAP1, BARD1, BMPR1A, BRCA1, BRCA2, BRIP1, CDH1, CDKN2A, CHEK2, FH, FLCN, MET, MLH1, MSH2, MSH6, MUTYH, NF1, NTHL1, PALB2, PMS2, PTEN, RAD51C, RAD51D, SMAD4, STK11, TP53, TSC1, TSC2, and VHL (sequencing and deletion/duplication); AXIN2, HOXB13, MBD4, MSH3, POLD1 and POLE (sequencing only); EPCAM and GREM1 (deletion/duplication only).    01/12/2023 -  Chemotherapy   Patient is on Treatment Plan : BREAST  Docetaxel  + Carboplatin  +  Trastuzumab  + Pertuzumab   (TCHP) q21d      06/05/2023 Surgery   Right lumpectomy: Pathologic complete response   07/09/2023 - 08/20/2023 Radiation Therapy   Adjuvant XRT     CHIEF COMPLIANT: Herceptin  Perjeta  maintenance  HISTORY OF PRESENT ILLNESS:  History of Present Illness Kara Webb is a 43 year old female with breast cancer undergoing radiation therapy who presents with fatigue and weakness.  She experiences persistent fatigue and weakness during her radiation therapy, with occasional dizziness. Her final treatment is scheduled for August 20, 2023. Hemoglobin levels have increased from 8.9 to 11.4 during treatment. She reports minimal side effects from radiation, such as occasional soreness.  She inquires about starting Ozempic and whether alcohol consumption is permissible during her treatment. She also discusses the removal of her port after her last maintenance medication appointment in December 2025. Night sweats occur around her menstrual cycle but are less severe than during chemotherapy.     ALLERGIES:  is allergic to [facility] [fosaprepitant  dimeglumine].  MEDICATIONS:  Current Outpatient Medications  Medication Sig Dispense Refill   ALPRAZolam  (XANAX ) 0.5 MG tablet Take 1 tablet (0.5 mg total) by mouth at bedtime as needed for anxiety or sleep. 20 tablet 0   amoxicillin (AMOXIL) 500 MG capsule Take 500 mg by mouth 2 (two) times daily.     azithromycin  (ZITHROMAX  Z-PAK) 250 MG tablet Per instructions. Only take if symptoms worsen and flu/covid negative, notify cancer center prior to starting 6 each 0   ferrous gluconate  (FERGON) 324 MG tablet Take 1 tablet (324 mg total) by mouth 2 (two) times daily with a meal. 60 tablet 6   ibuprofen  (ADVIL ) 800 MG tablet Take 1 tablet (800 mg total) by mouth every 8 (eight)  hours as needed. 30 tablet 0   IUD'S IU IUD's     lidocaine -prilocaine  (EMLA ) cream Apply to affected area once 30 g 3   ondansetron  (ZOFRAN ) 8 MG tablet Take 1 tablet  (8 mg total) by mouth every 8 (eight) hours as needed for nausea or vomiting. Start on the third day after chemotherapy. 30 tablet 1   prochlorperazine  (COMPAZINE ) 10 MG tablet Take 1 tablet (10 mg total) by mouth every 6 (six) hours as needed for nausea or vomiting. 30 tablet 1   No current facility-administered medications for this visit.   Facility-Administered Medications Ordered in Other Visits  Medication Dose Route Frequency Provider Last Rate Last Admin   0.9 %  sodium chloride  infusion   Intravenous Continuous Odean Potts, MD 10 mL/hr at 07/26/23 1533 Infusion Verify at 07/26/23 1533   heparin  lock flush 100 unit/mL  500 Units Intracatheter Once PRN Odean Potts, MD       pertuzumab  (PERJETA ) 420 mg in sodium chloride  0.9 % 250 mL chemo infusion  420 mg Intravenous Once Kara Frese, MD 528 mL/hr at 07/26/23 1559 420 mg at 07/26/23 1559   sodium chloride  flush (NS) 0.9 % injection 10 mL  10 mL Intracatheter PRN Odean Potts, MD        PHYSICAL EXAMINATION: ECOG PERFORMANCE STATUS: 1 - Symptomatic but completely ambulatory  Vitals:   07/26/23 1411  BP: (!) 148/100  Pulse: 85  Resp: 18  Temp: (!) 97 F (36.1 C)  SpO2: 98%   Filed Weights   07/26/23 1411  Weight: 291 lb 11.2 oz (132.3 kg)     LABORATORY DATA:  I have reviewed the data as listed    Latest Ref Rng & Units 07/26/2023    1:51 PM 06/14/2023   10:26 AM 05/17/2023   10:33 AM  CMP  Glucose 70 - 99 mg/dL 86  897  878   BUN 6 - 20 mg/dL 12  15  14    Creatinine 0.44 - 1.00 mg/dL 9.16  9.19  9.15   Sodium 135 - 145 mmol/L 138  138  140   Potassium 3.5 - 5.1 mmol/L 3.7  3.8  4.0   Chloride 98 - 111 mmol/L 106  106  107   CO2 22 - 32 mmol/L 29  28  29    Calcium 8.9 - 10.3 mg/dL 9.1  9.3  9.1   Total Protein 6.5 - 8.1 g/dL 7.2  7.2  7.1   Total Bilirubin 0.0 - 1.2 mg/dL 0.2  0.2  0.3   Alkaline Phos 38 - 126 U/L 88  113  117   AST 15 - 41 U/L 24  39  81   ALT 0 - 44 U/L 44  70  114     Lab Results   Component Value Date   WBC 3.0 (L) 07/26/2023   HGB 11.4 (L) 07/26/2023   HCT 35.6 (L) 07/26/2023   MCV 80.7 07/26/2023   PLT 167 07/26/2023   NEUTROABS 1.6 (L) 07/26/2023    ASSESSMENT & PLAN:  Malignant neoplasm of upper-outer quadrant of right breast in female, estrogen receptor negative (HCC) 12/18/2022:Palpable right breast lump (irregular density) by ultrasound 1.3 cm at 9:30 position, ultrasound-guided biopsy: Grade 2 IDC no LVI, ER 0%, PR 0%, Ki67 30%, HER2 3+ positive, 1 axillary lymph node with cortical thickening biopsy: Positive    Breast MRI 01/08/2023: 1.6 cm mass, 2.4 cm non-mass enhancement: Biopsy: DCIS   Treatment plan: Neoadjuvant chemotherapy with TCH Perjeta   01/12/2023-04/26/2023 06/05/2023: Right lumpectomy: Negative for residual cancer, pathologic complete response 0/1 lymph node negative Adjuvant radiation therapy ----------------------------------------------------------------------------------------------------------------------------------------------- Current treatment: Herceptin  and Perjeta  maintenance until 12/20/2023   Return to clinic every 3 weeks for Herceptin  and Perjeta  every 6 weeks and follow-up with me ------------------------------------- Assessment and Plan Assessment & Plan Malignant neoplasm of right breast Undergoing radiation therapy with complete response. No lymph node radiation needed. Blood work shows improved white blood cell count and hemoglobin levels. Minor soreness managed with cream.  - Continue radiation therapy, final treatment on August 20, 2023. - Monitor blood work for white blood cell count and hemoglobin levels.  Radiation therapy for breast cancer Radiation therapy ongoing, well-tolerated with minimal side effects. Minor soreness managed with cream. Concludes August 20, 2023. - Continue current radiation therapy regimen. - Apply cream as directed.  Fatigue and weakness Persistent fatigue and weakness likely due to cancer  treatment and recovery.  Dizziness Occasional dizziness possibly related to fatigue and treatment.  Fibroids Causing heavy menstrual cycles. Ovary removal considered for symptom management. Genetic and estrogen receptor tests negative, ovary removal not medically necessary. - Discuss ovary removal with OB/GYN for symptom management.      No orders of the defined types were placed in this encounter.  The patient has a good understanding of the overall plan. she agrees with it. she will call with any problems that may develop before the next visit here. Total time spent: 30 mins including face to face time and time spent for planning, charting and co-ordination of care   Viinay K Sapna Padron, MD 07/26/23

## 2023-07-26 NOTE — Progress Notes (Signed)
 Per Dr. Odean OK to proceed with tx today using ECHO from 04/25/2023 left EF 60-65%. Updated ECHO to be obtained prior to next treatment.

## 2023-07-26 NOTE — Patient Instructions (Signed)
 [facility] - A DEPT OF [facility]  Discharge Instructions: Thank you for choosing [facility] to provide your oncology and hematology care.   If you have a lab appointment with the Cancer Center, please go directly to the Cancer Center and check in at the registration area.   Wear comfortable clothing and clothing appropriate for easy access to any Portacath or PICC line.   We strive to give you quality time with your provider. You may need to reschedule your appointment if you arrive late (15 or more minutes).  Arriving late affects you and other patients whose appointments are after yours.  Also, if you miss three or more appointments without notifying the office, you may be dismissed from the clinic at the provider's discretion.      For prescription refill requests, have your pharmacy contact our office and allow 72 hours for refills to be completed.    Today you received the following chemotherapy and/or immunotherapy agents: Kanjinti/Perjeta      To help prevent nausea and vomiting after your treatment, we encourage you to take your nausea medication as directed.  BELOW ARE SYMPTOMS THAT SHOULD BE REPORTED IMMEDIATELY: *FEVER GREATER THAN 100.4 F (38 C) OR HIGHER *CHILLS OR SWEATING *NAUSEA AND VOMITING THAT IS NOT CONTROLLED WITH YOUR NAUSEA MEDICATION *UNUSUAL SHORTNESS OF BREATH *UNUSUAL BRUISING OR BLEEDING *URINARY PROBLEMS (pain or burning when urinating, or frequent urination) *BOWEL PROBLEMS (unusual diarrhea, constipation, pain near the anus) TENDERNESS IN MOUTH AND THROAT WITH OR WITHOUT PRESENCE OF ULCERS (sore throat, sores in mouth, or a toothache) UNUSUAL RASH, SWELLING OR PAIN  UNUSUAL VAGINAL DISCHARGE OR ITCHING   Items with * indicate a potential emergency and should be followed up as soon as possible or go to the Emergency Department if any problems should occur.  Please show the CHEMOTHERAPY ALERT CARD or  IMMUNOTHERAPY ALERT CARD at check-in to the Emergency Department and triage nurse.  Should you have questions after your visit or need to cancel or reschedule your appointment, please contact [facility] - A DEPT OF [facility]  Dept: [phone]  and follow the prompts.  Office hours are 8:00 a.m. to 4:30 p.m. Monday - Friday. Please note that voicemails left after 4:00 p.m. may not be returned until the following business day.  We are closed weekends and major holidays. You have access to a nurse at all times for urgent questions. Please call the main number to the clinic Dept: [phone] and follow the prompts.   For any non-urgent questions, you may also contact your provider using MyChart. We now offer e-Visits for anyone 91 and older to request care online for non-urgent symptoms. For details visit [email or web address].   Also download the MyChart app! Go to the app store, search "MyChart", open the app, select [place], and log in with your MyChart username and password.

## 2023-07-27 ENCOUNTER — Telehealth: Payer: Self-pay | Admitting: Radiation Oncology

## 2023-07-27 ENCOUNTER — Other Ambulatory Visit: Payer: Self-pay

## 2023-07-27 ENCOUNTER — Ambulatory Visit
Admission: RE | Admit: 2023-07-27 | Discharge: 2023-07-27 | Disposition: A | Discharge status: Home / Self Care | Source: Ambulatory Visit | Attending: Radiation Oncology | Admitting: Radiation Oncology

## 2023-07-27 DIAGNOSIS — C50411 Malignant neoplasm of upper-outer quadrant of right female breast: Secondary | ICD-10-CM | POA: Diagnosis not present

## 2023-07-27 LAB — RAD ONC ARIA SESSION SUMMARY
Course Elapsed Days: 22
Plan Fractions Treated to Date: 15
Plan Prescribed Dose Per Fraction: 2 Gy
Plan Total Fractions Prescribed: 25
Plan Total Prescribed Dose: 50 Gy
Reference Point Dosage Given to Date: 30 Gy
Reference Point Session Dosage Given: 2 Gy
Session Number: 15

## 2023-07-27 NOTE — Telephone Encounter (Signed)
 Received call from pt requesting appt changes. Message passed to Support RTT who advised they would call pt back with changes.

## 2023-07-30 ENCOUNTER — Ambulatory Visit
Admission: RE | Admit: 2023-07-30 | Discharge: 2023-07-30 | Disposition: A | Discharge status: Home / Self Care | Source: Ambulatory Visit | Attending: Radiation Oncology | Admitting: Radiation Oncology

## 2023-07-30 ENCOUNTER — Telehealth: Payer: Self-pay | Admitting: Radiation Oncology

## 2023-07-30 ENCOUNTER — Ambulatory Visit (INDEPENDENT_AMBULATORY_CARE_PROVIDER_SITE_OTHER)

## 2023-07-30 ENCOUNTER — Ambulatory Visit

## 2023-07-30 ENCOUNTER — Other Ambulatory Visit: Payer: Self-pay

## 2023-07-30 ENCOUNTER — Other Ambulatory Visit (HOSPITAL_COMMUNITY)

## 2023-07-30 DIAGNOSIS — Z0189 Encounter for other specified special examinations: Secondary | ICD-10-CM | POA: Diagnosis not present

## 2023-07-30 DIAGNOSIS — Z5181 Encounter for therapeutic drug level monitoring: Secondary | ICD-10-CM

## 2023-07-30 DIAGNOSIS — Z171 Estrogen receptor negative status [ER-]: Secondary | ICD-10-CM | POA: Diagnosis not present

## 2023-07-30 DIAGNOSIS — Z79899 Other long term (current) drug therapy: Secondary | ICD-10-CM | POA: Diagnosis not present

## 2023-07-30 DIAGNOSIS — Z510 Encounter for antineoplastic radiation therapy: Secondary | ICD-10-CM | POA: Diagnosis not present

## 2023-07-30 DIAGNOSIS — C50411 Malignant neoplasm of upper-outer quadrant of right female breast: Secondary | ICD-10-CM | POA: Diagnosis not present

## 2023-07-30 LAB — RAD ONC ARIA SESSION SUMMARY
Course Elapsed Days: 25
Plan Fractions Treated to Date: 16
Plan Prescribed Dose Per Fraction: 2 Gy
Plan Total Fractions Prescribed: 25
Plan Total Prescribed Dose: 50 Gy
Reference Point Dosage Given to Date: 32 Gy
Reference Point Session Dosage Given: 2 Gy
Session Number: 16

## 2023-07-30 LAB — ECHOCARDIOGRAM COMPLETE
Area-P 1/2: 6.17 cm2
S' Lateral: 2.65 cm

## 2023-07-30 NOTE — Telephone Encounter (Signed)
 Pt called asking about needing schedule change due to exhaustion affecting work and daily routine. Message passed to Support RTT who advised they would c/b to notify pt of change.

## 2023-07-31 ENCOUNTER — Ambulatory Visit
Admission: RE | Admit: 2023-07-31 | Discharge: 2023-07-31 | Disposition: A | Discharge status: Home / Self Care | Source: Ambulatory Visit | Attending: Radiation Oncology | Admitting: Radiation Oncology

## 2023-07-31 ENCOUNTER — Other Ambulatory Visit: Payer: Self-pay

## 2023-07-31 DIAGNOSIS — C50411 Malignant neoplasm of upper-outer quadrant of right female breast: Secondary | ICD-10-CM | POA: Diagnosis not present

## 2023-07-31 LAB — RAD ONC ARIA SESSION SUMMARY
Course Elapsed Days: 26
Plan Fractions Treated to Date: 17
Plan Prescribed Dose Per Fraction: 2 Gy
Plan Total Fractions Prescribed: 25
Plan Total Prescribed Dose: 50 Gy
Reference Point Dosage Given to Date: 34 Gy
Reference Point Session Dosage Given: 2 Gy
Session Number: 17

## 2023-08-01 ENCOUNTER — Other Ambulatory Visit: Payer: Self-pay

## 2023-08-01 ENCOUNTER — Ambulatory Visit
Admission: RE | Admit: 2023-08-01 | Discharge: 2023-08-01 | Disposition: A | Discharge status: Home / Self Care | Source: Ambulatory Visit | Attending: Radiation Oncology | Admitting: Radiation Oncology

## 2023-08-01 DIAGNOSIS — Z510 Encounter for antineoplastic radiation therapy: Secondary | ICD-10-CM | POA: Diagnosis not present

## 2023-08-01 DIAGNOSIS — C50411 Malignant neoplasm of upper-outer quadrant of right female breast: Secondary | ICD-10-CM | POA: Diagnosis not present

## 2023-08-01 DIAGNOSIS — Z171 Estrogen receptor negative status [ER-]: Secondary | ICD-10-CM | POA: Diagnosis not present

## 2023-08-01 LAB — RAD ONC ARIA SESSION SUMMARY
Course Elapsed Days: 27
Plan Fractions Treated to Date: 18
Plan Prescribed Dose Per Fraction: 2 Gy
Plan Total Fractions Prescribed: 25
Plan Total Prescribed Dose: 50 Gy
Reference Point Dosage Given to Date: 36 Gy
Reference Point Session Dosage Given: 2 Gy
Session Number: 18

## 2023-08-02 ENCOUNTER — Ambulatory Visit
Admission: RE | Admit: 2023-08-02 | Discharge: 2023-08-02 | Disposition: A | Discharge status: Home / Self Care | Source: Ambulatory Visit | Attending: Radiation Oncology | Admitting: Radiation Oncology

## 2023-08-02 ENCOUNTER — Other Ambulatory Visit: Payer: Self-pay

## 2023-08-02 DIAGNOSIS — C50411 Malignant neoplasm of upper-outer quadrant of right female breast: Secondary | ICD-10-CM | POA: Diagnosis not present

## 2023-08-02 LAB — RAD ONC ARIA SESSION SUMMARY
Course Elapsed Days: 28
Plan Fractions Treated to Date: 19
Plan Prescribed Dose Per Fraction: 2 Gy
Plan Total Fractions Prescribed: 25
Plan Total Prescribed Dose: 50 Gy
Reference Point Dosage Given to Date: 38 Gy
Reference Point Session Dosage Given: 2 Gy
Session Number: 19

## 2023-08-03 ENCOUNTER — Ambulatory Visit
Admission: RE | Admit: 2023-08-03 | Discharge: 2023-08-03 | Disposition: A | Discharge status: Home / Self Care | Source: Ambulatory Visit | Attending: Radiation Oncology | Admitting: Radiation Oncology

## 2023-08-03 ENCOUNTER — Other Ambulatory Visit: Payer: Self-pay

## 2023-08-03 DIAGNOSIS — C50411 Malignant neoplasm of upper-outer quadrant of right female breast: Secondary | ICD-10-CM | POA: Diagnosis not present

## 2023-08-03 DIAGNOSIS — F411 Generalized anxiety disorder: Secondary | ICD-10-CM | POA: Diagnosis not present

## 2023-08-03 DIAGNOSIS — Z171 Estrogen receptor negative status [ER-]: Secondary | ICD-10-CM | POA: Diagnosis not present

## 2023-08-03 DIAGNOSIS — Z510 Encounter for antineoplastic radiation therapy: Secondary | ICD-10-CM | POA: Diagnosis not present

## 2023-08-03 LAB — RAD ONC ARIA SESSION SUMMARY
Course Elapsed Days: 29
Plan Fractions Treated to Date: 20
Plan Prescribed Dose Per Fraction: 2 Gy
Plan Total Fractions Prescribed: 25
Plan Total Prescribed Dose: 50 Gy
Reference Point Dosage Given to Date: 40 Gy
Reference Point Session Dosage Given: 2 Gy
Session Number: 20

## 2023-08-06 ENCOUNTER — Ambulatory Visit
Admission: RE | Admit: 2023-08-06 | Discharge: 2023-08-06 | Disposition: A | Discharge status: Home / Self Care | Source: Ambulatory Visit | Attending: Radiation Oncology | Admitting: Radiation Oncology

## 2023-08-06 ENCOUNTER — Other Ambulatory Visit: Payer: Self-pay

## 2023-08-06 DIAGNOSIS — C50411 Malignant neoplasm of upper-outer quadrant of right female breast: Secondary | ICD-10-CM | POA: Diagnosis not present

## 2023-08-06 LAB — RAD ONC ARIA SESSION SUMMARY
Course Elapsed Days: 32
Plan Fractions Treated to Date: 21
Plan Prescribed Dose Per Fraction: 2 Gy
Plan Total Fractions Prescribed: 25
Plan Total Prescribed Dose: 50 Gy
Reference Point Dosage Given to Date: 42 Gy
Reference Point Session Dosage Given: 2 Gy
Session Number: 21

## 2023-08-07 ENCOUNTER — Ambulatory Visit
Admission: RE | Admit: 2023-08-07 | Discharge: 2023-08-07 | Disposition: A | Discharge status: Home / Self Care | Source: Ambulatory Visit | Attending: Radiation Oncology | Admitting: Radiation Oncology

## 2023-08-07 ENCOUNTER — Other Ambulatory Visit: Payer: Self-pay

## 2023-08-07 ENCOUNTER — Ambulatory Visit: Admitting: Radiation Oncology

## 2023-08-07 ENCOUNTER — Ambulatory Visit

## 2023-08-07 DIAGNOSIS — C50411 Malignant neoplasm of upper-outer quadrant of right female breast: Secondary | ICD-10-CM | POA: Diagnosis not present

## 2023-08-07 DIAGNOSIS — Z171 Estrogen receptor negative status [ER-]: Secondary | ICD-10-CM | POA: Diagnosis not present

## 2023-08-07 DIAGNOSIS — Z510 Encounter for antineoplastic radiation therapy: Secondary | ICD-10-CM | POA: Diagnosis not present

## 2023-08-07 LAB — RAD ONC ARIA SESSION SUMMARY
Course Elapsed Days: 33
Plan Fractions Treated to Date: 22
Plan Prescribed Dose Per Fraction: 2 Gy
Plan Total Fractions Prescribed: 25
Plan Total Prescribed Dose: 50 Gy
Reference Point Dosage Given to Date: 44 Gy
Reference Point Session Dosage Given: 2 Gy
Session Number: 22

## 2023-08-08 ENCOUNTER — Other Ambulatory Visit: Payer: Self-pay

## 2023-08-08 ENCOUNTER — Ambulatory Visit
Admission: RE | Admit: 2023-08-08 | Discharge: 2023-08-08 | Disposition: A | Discharge status: Home / Self Care | Source: Ambulatory Visit | Attending: Radiation Oncology | Admitting: Radiation Oncology

## 2023-08-08 DIAGNOSIS — Z171 Estrogen receptor negative status [ER-]: Secondary | ICD-10-CM | POA: Diagnosis not present

## 2023-08-08 DIAGNOSIS — C50411 Malignant neoplasm of upper-outer quadrant of right female breast: Secondary | ICD-10-CM | POA: Diagnosis not present

## 2023-08-08 DIAGNOSIS — Z510 Encounter for antineoplastic radiation therapy: Secondary | ICD-10-CM | POA: Diagnosis not present

## 2023-08-08 LAB — RAD ONC ARIA SESSION SUMMARY
Course Elapsed Days: 34
Plan Fractions Treated to Date: 23
Plan Prescribed Dose Per Fraction: 2 Gy
Plan Total Fractions Prescribed: 25
Plan Total Prescribed Dose: 50 Gy
Reference Point Dosage Given to Date: 46 Gy
Reference Point Session Dosage Given: 2 Gy
Session Number: 23

## 2023-08-09 ENCOUNTER — Ambulatory Visit
Admission: RE | Admit: 2023-08-09 | Discharge: 2023-08-09 | Disposition: A | Discharge status: Home / Self Care | Source: Ambulatory Visit | Attending: Radiation Oncology | Admitting: Radiation Oncology

## 2023-08-09 ENCOUNTER — Ambulatory Visit

## 2023-08-09 ENCOUNTER — Other Ambulatory Visit: Payer: Self-pay

## 2023-08-09 DIAGNOSIS — C50411 Malignant neoplasm of upper-outer quadrant of right female breast: Secondary | ICD-10-CM | POA: Diagnosis not present

## 2023-08-09 LAB — RAD ONC ARIA SESSION SUMMARY
Course Elapsed Days: 35
Plan Fractions Treated to Date: 24
Plan Prescribed Dose Per Fraction: 2 Gy
Plan Total Fractions Prescribed: 25
Plan Total Prescribed Dose: 50 Gy
Reference Point Dosage Given to Date: 48 Gy
Reference Point Session Dosage Given: 2 Gy
Session Number: 24

## 2023-08-10 ENCOUNTER — Other Ambulatory Visit: Payer: Self-pay

## 2023-08-10 ENCOUNTER — Ambulatory Visit

## 2023-08-10 ENCOUNTER — Telehealth: Payer: Self-pay | Admitting: *Deleted

## 2023-08-10 ENCOUNTER — Ambulatory Visit
Admission: RE | Admit: 2023-08-10 | Discharge: 2023-08-10 | Disposition: A | Discharge status: Home / Self Care | Source: Ambulatory Visit | Attending: Radiation Oncology | Admitting: Radiation Oncology

## 2023-08-10 DIAGNOSIS — C50411 Malignant neoplasm of upper-outer quadrant of right female breast: Secondary | ICD-10-CM | POA: Diagnosis not present

## 2023-08-10 DIAGNOSIS — Z171 Estrogen receptor negative status [ER-]: Secondary | ICD-10-CM | POA: Diagnosis not present

## 2023-08-10 DIAGNOSIS — Z510 Encounter for antineoplastic radiation therapy: Secondary | ICD-10-CM | POA: Diagnosis not present

## 2023-08-10 LAB — RAD ONC ARIA SESSION SUMMARY
Course Elapsed Days: 36
Plan Fractions Treated to Date: 25
Plan Prescribed Dose Per Fraction: 2 Gy
Plan Total Fractions Prescribed: 25
Plan Total Prescribed Dose: 50 Gy
Reference Point Dosage Given to Date: 50 Gy
Reference Point Session Dosage Given: 2 Gy
Session Number: 25

## 2023-08-10 NOTE — Telephone Encounter (Signed)
 Received VM from pt with complaint of pain while undergoing radiation tx as well as requesting a refill for Xanax .  MD out of office, Xanax  request will be reviewed with MD on Monday and send over if appropriate.  RN also sent message to radiation nurse to contact pt regarding pain.

## 2023-08-13 ENCOUNTER — Telehealth: Payer: Self-pay | Admitting: Radiation Oncology

## 2023-08-13 ENCOUNTER — Other Ambulatory Visit: Payer: Self-pay

## 2023-08-13 ENCOUNTER — Ambulatory Visit

## 2023-08-13 ENCOUNTER — Other Ambulatory Visit: Payer: Self-pay | Admitting: Hematology and Oncology

## 2023-08-13 ENCOUNTER — Ambulatory Visit
Admission: RE | Admit: 2023-08-13 | Discharge: 2023-08-13 | Disposition: A | Discharge status: Home / Self Care | Source: Ambulatory Visit | Attending: Radiation Oncology | Admitting: Radiation Oncology

## 2023-08-13 DIAGNOSIS — C50411 Malignant neoplasm of upper-outer quadrant of right female breast: Secondary | ICD-10-CM | POA: Diagnosis not present

## 2023-08-13 DIAGNOSIS — Z171 Estrogen receptor negative status [ER-]: Secondary | ICD-10-CM | POA: Diagnosis not present

## 2023-08-13 DIAGNOSIS — Z510 Encounter for antineoplastic radiation therapy: Secondary | ICD-10-CM | POA: Diagnosis not present

## 2023-08-13 LAB — RAD ONC ARIA SESSION SUMMARY
Course Elapsed Days: 39
Plan Fractions Treated to Date: 1
Plan Prescribed Dose Per Fraction: 2 Gy
Plan Total Fractions Prescribed: 6
Plan Total Prescribed Dose: 12 Gy
Reference Point Dosage Given to Date: 2 Gy
Reference Point Session Dosage Given: 2 Gy
Session Number: 26

## 2023-08-13 MED ORDER — ALPRAZOLAM 0.5 MG PO TABS: 0.5000 mg | ORAL_TABLET | Freq: Every evening | ORAL | 3 refills | Status: AC | PRN

## 2023-08-13 NOTE — Telephone Encounter (Signed)
 7/28 @ 8:32 am Pt called to change her treatment appt time for today and tomorrow for earlier time.  Called Support RTT, transfer called as requested.

## 2023-08-14 ENCOUNTER — Other Ambulatory Visit: Payer: Self-pay

## 2023-08-14 ENCOUNTER — Ambulatory Visit

## 2023-08-14 ENCOUNTER — Ambulatory Visit
Admission: RE | Admit: 2023-08-14 | Discharge: 2023-08-14 | Disposition: A | Discharge status: Home / Self Care | Source: Ambulatory Visit | Attending: Radiation Oncology | Admitting: Radiation Oncology

## 2023-08-14 DIAGNOSIS — C50411 Malignant neoplasm of upper-outer quadrant of right female breast: Secondary | ICD-10-CM | POA: Diagnosis not present

## 2023-08-14 LAB — RAD ONC ARIA SESSION SUMMARY
Course Elapsed Days: 40
Plan Fractions Treated to Date: 2
Plan Prescribed Dose Per Fraction: 2 Gy
Plan Total Fractions Prescribed: 6
Plan Total Prescribed Dose: 12 Gy
Reference Point Dosage Given to Date: 4 Gy
Reference Point Session Dosage Given: 2 Gy
Session Number: 27

## 2023-08-15 ENCOUNTER — Ambulatory Visit

## 2023-08-15 ENCOUNTER — Ambulatory Visit
Admission: RE | Admit: 2023-08-15 | Discharge: 2023-08-15 | Disposition: A | Discharge status: Home / Self Care | Source: Ambulatory Visit | Attending: Radiation Oncology | Admitting: Radiation Oncology

## 2023-08-15 ENCOUNTER — Other Ambulatory Visit: Payer: Self-pay

## 2023-08-15 DIAGNOSIS — C50411 Malignant neoplasm of upper-outer quadrant of right female breast: Secondary | ICD-10-CM | POA: Diagnosis not present

## 2023-08-15 LAB — RAD ONC ARIA SESSION SUMMARY
Course Elapsed Days: 41
Plan Fractions Treated to Date: 3
Plan Prescribed Dose Per Fraction: 2 Gy
Plan Total Fractions Prescribed: 6
Plan Total Prescribed Dose: 12 Gy
Reference Point Dosage Given to Date: 6 Gy
Reference Point Session Dosage Given: 2 Gy
Session Number: 28

## 2023-08-16 ENCOUNTER — Encounter: Payer: Self-pay | Admitting: *Deleted

## 2023-08-16 ENCOUNTER — Ambulatory Visit
Admission: RE | Admit: 2023-08-16 | Discharge: 2023-08-16 | Disposition: A | Discharge status: Home / Self Care | Source: Ambulatory Visit | Attending: Radiation Oncology | Admitting: Radiation Oncology

## 2023-08-16 ENCOUNTER — Inpatient Hospital Stay

## 2023-08-16 ENCOUNTER — Ambulatory Visit

## 2023-08-16 ENCOUNTER — Other Ambulatory Visit: Payer: Self-pay

## 2023-08-16 VITALS — BP 126/95 | HR 80 | Temp 98.0°F | Resp 18 | Wt 294.0 lb

## 2023-08-16 DIAGNOSIS — C50411 Malignant neoplasm of upper-outer quadrant of right female breast: Secondary | ICD-10-CM | POA: Diagnosis not present

## 2023-08-16 LAB — RAD ONC ARIA SESSION SUMMARY
Course Elapsed Days: 42
Plan Fractions Treated to Date: 4
Plan Prescribed Dose Per Fraction: 2 Gy
Plan Total Fractions Prescribed: 6
Plan Total Prescribed Dose: 12 Gy
Reference Point Dosage Given to Date: 8 Gy
Reference Point Session Dosage Given: 2 Gy
Session Number: 29

## 2023-08-16 MED ORDER — HEPARIN SOD (PORK) LOCK FLUSH 100 UNIT/ML IV SOLN
500.0000 [IU] | Freq: Once | INTRAVENOUS | Status: AC | PRN
Start: 2023-08-16 — End: 2023-08-16
  Administered 2023-08-16: 500 [IU]

## 2023-08-16 MED ORDER — SODIUM CHLORIDE 0.9 % IV SOLN: INTRAVENOUS | Status: DC

## 2023-08-16 MED ORDER — SODIUM CHLORIDE 0.9 % IV SOLN
420.0000 mg | Freq: Once | INTRAVENOUS | Status: AC
  Administered 2023-08-16: 420 mg via INTRAVENOUS
  Filled 2023-08-16: qty 14

## 2023-08-16 MED ORDER — TRASTUZUMAB-ANNS CHEMO 150 MG IV SOLR
750.0000 mg | Freq: Once | INTRAVENOUS | Status: AC
  Administered 2023-08-16: 750 mg via INTRAVENOUS
  Filled 2023-08-16: qty 35.71

## 2023-08-16 MED ORDER — SODIUM CHLORIDE 0.9% FLUSH
10.0000 mL | INTRAVENOUS | Status: DC | PRN
  Administered 2023-08-16: 10 mL

## 2023-08-16 MED ORDER — DIPHENHYDRAMINE HCL 25 MG PO CAPS
50.0000 mg | ORAL_CAPSULE | Freq: Once | ORAL | Status: AC
  Administered 2023-08-16: 50 mg via ORAL

## 2023-08-16 MED ORDER — ACETAMINOPHEN 325 MG PO TABS
650.0000 mg | ORAL_TABLET | Freq: Once | ORAL | Status: AC
  Administered 2023-08-16: 650 mg via ORAL

## 2023-08-16 NOTE — Patient Instructions (Signed)
 [facility] - A DEPT OF [facility]  Discharge Instructions: Thank you for choosing [facility] to provide your oncology and hematology care.   If you have a lab appointment with the Cancer Center, please go directly to the Cancer Center and check in at the registration area.   Wear comfortable clothing and clothing appropriate for easy access to any Portacath or PICC line.   We strive to give you quality time with your provider. You may need to reschedule your appointment if you arrive late (15 or more minutes).  Arriving late affects you and other patients whose appointments are after yours.  Also, if you miss three or more appointments without notifying the office, you may be dismissed from the clinic at the provider's discretion.      For prescription refill requests, have your pharmacy contact our office and allow 72 hours for refills to be completed.    Today you received the following chemotherapy and/or immunotherapy agents: Kanjinti, Pertuzumab.       To help prevent nausea and vomiting after your treatment, we encourage you to take your nausea medication as directed.  BELOW ARE SYMPTOMS THAT SHOULD BE REPORTED IMMEDIATELY: *FEVER GREATER THAN 100.4 F (38 C) OR HIGHER *CHILLS OR SWEATING *NAUSEA AND VOMITING THAT IS NOT CONTROLLED WITH YOUR NAUSEA MEDICATION *UNUSUAL SHORTNESS OF BREATH *UNUSUAL BRUISING OR BLEEDING *URINARY PROBLEMS (pain or burning when urinating, or frequent urination) *BOWEL PROBLEMS (unusual diarrhea, constipation, pain near the anus) TENDERNESS IN MOUTH AND THROAT WITH OR WITHOUT PRESENCE OF ULCERS (sore throat, sores in mouth, or a toothache) UNUSUAL RASH, SWELLING OR PAIN  UNUSUAL VAGINAL DISCHARGE OR ITCHING   Items with * indicate a potential emergency and should be followed up as soon as possible or go to the Emergency Department if any problems should occur.  Please show the CHEMOTHERAPY ALERT CARD or  IMMUNOTHERAPY ALERT CARD at check-in to the Emergency Department and triage nurse.  Should you have questions after your visit or need to cancel or reschedule your appointment, please contact [facility] - A DEPT OF [facility]  Dept: [phone]  and follow the prompts.  Office hours are 8:00 a.m. to 4:30 p.m. Monday - Friday. Please note that voicemails left after 4:00 p.m. may not be returned until the following business day.  We are closed weekends and major holidays. You have access to a nurse at all times for urgent questions. Please call the main number to the clinic Dept: [phone] and follow the prompts.   For any non-urgent questions, you may also contact your provider using MyChart. We now offer e-Visits for anyone 62 and older to request care online for non-urgent symptoms. For details visit [email or web address].   Also download the MyChart app! Go to the app store, search "MyChart", open the app, select [place], and log in with your MyChart username and password.

## 2023-08-17 ENCOUNTER — Ambulatory Visit
Admission: RE | Admit: 2023-08-17 | Discharge: 2023-08-17 | Disposition: A | Discharge status: Home / Self Care | Source: Ambulatory Visit | Attending: Radiation Oncology | Admitting: Radiation Oncology

## 2023-08-17 ENCOUNTER — Ambulatory Visit

## 2023-08-17 ENCOUNTER — Other Ambulatory Visit: Payer: Self-pay

## 2023-08-17 DIAGNOSIS — C50411 Malignant neoplasm of upper-outer quadrant of right female breast: Secondary | ICD-10-CM | POA: Diagnosis not present

## 2023-08-17 DIAGNOSIS — Z510 Encounter for antineoplastic radiation therapy: Secondary | ICD-10-CM | POA: Diagnosis not present

## 2023-08-17 DIAGNOSIS — Z5112 Encounter for antineoplastic immunotherapy: Secondary | ICD-10-CM | POA: Diagnosis not present

## 2023-08-17 DIAGNOSIS — Z171 Estrogen receptor negative status [ER-]: Secondary | ICD-10-CM | POA: Diagnosis not present

## 2023-08-17 LAB — RAD ONC ARIA SESSION SUMMARY
Course Elapsed Days: 43
Plan Fractions Treated to Date: 5
Plan Prescribed Dose Per Fraction: 2 Gy
Plan Total Fractions Prescribed: 6
Plan Total Prescribed Dose: 12 Gy
Reference Point Dosage Given to Date: 10 Gy
Reference Point Session Dosage Given: 2 Gy
Session Number: 30

## 2023-08-20 ENCOUNTER — Other Ambulatory Visit: Payer: Self-pay

## 2023-08-20 ENCOUNTER — Ambulatory Visit
Admission: RE | Admit: 2023-08-20 | Discharge: 2023-08-20 | Disposition: A | Discharge status: Home / Self Care | Source: Ambulatory Visit | Attending: Radiation Oncology | Admitting: Radiation Oncology

## 2023-08-20 DIAGNOSIS — C50411 Malignant neoplasm of upper-outer quadrant of right female breast: Secondary | ICD-10-CM | POA: Diagnosis not present

## 2023-08-20 LAB — RAD ONC ARIA SESSION SUMMARY
Course Elapsed Days: 46
Plan Fractions Treated to Date: 6
Plan Prescribed Dose Per Fraction: 2 Gy
Plan Total Fractions Prescribed: 6
Plan Total Prescribed Dose: 12 Gy
Reference Point Dosage Given to Date: 12 Gy
Reference Point Session Dosage Given: 2 Gy
Session Number: 31

## 2023-08-21 NOTE — Radiation Completion Notes (Signed)
  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, ER-/PR-/HER2+ Intent: Curative     ==========DELIVERED PLANS==========  First Treatment Date: 2023-07-05 Last Treatment Date: 2023-08-20   Plan Name: Breast_R Site: Breast, Right Technique: 3D Mode: Photon Dose Per Fraction: 2 Gy Prescribed Dose (Delivered / Prescribed): 50 Gy / 50 Gy Prescribed Fxs (Delivered / Prescribed): 25 / 25   Plan Name: Breast_R_Bst Site: Breast, Right Technique: 3D Mode: Photon Dose Per Fraction: 2 Gy Prescribed Dose (Delivered / Prescribed): 12 Gy / 12 Gy Prescribed Fxs (Delivered / Prescribed): 6 / 6     ====================================   The patient tolerated radiation very well. She developed the anticipated skin changes during her treatment.   The patient will return in one month and will continue follow up with Dr. Gudena as well.      Ronita Due, PA-C

## 2023-09-06 ENCOUNTER — Inpatient Hospital Stay

## 2023-09-06 ENCOUNTER — Inpatient Hospital Stay: Attending: Hematology and Oncology

## 2023-09-06 ENCOUNTER — Inpatient Hospital Stay: Admitting: Adult Health

## 2023-09-06 ENCOUNTER — Encounter: Payer: Self-pay | Admitting: Adult Health

## 2023-09-06 VITALS — BP 135/94 | HR 91 | Temp 98.2°F | Resp 18 | Ht 66.0 in | Wt 293.5 lb

## 2023-09-06 DIAGNOSIS — Z800 Family history of malignant neoplasm of digestive organs: Secondary | ICD-10-CM | POA: Diagnosis not present

## 2023-09-06 DIAGNOSIS — Z803 Family history of malignant neoplasm of breast: Secondary | ICD-10-CM | POA: Diagnosis not present

## 2023-09-06 DIAGNOSIS — Z1731 Human epidermal growth factor receptor 2 positive status: Secondary | ICD-10-CM | POA: Insufficient documentation

## 2023-09-06 DIAGNOSIS — R102 Pelvic and perineal pain: Secondary | ICD-10-CM | POA: Diagnosis not present

## 2023-09-06 DIAGNOSIS — R5383 Other fatigue: Secondary | ICD-10-CM | POA: Insufficient documentation

## 2023-09-06 DIAGNOSIS — Z171 Estrogen receptor negative status [ER-]: Secondary | ICD-10-CM | POA: Diagnosis not present

## 2023-09-06 DIAGNOSIS — R011 Cardiac murmur, unspecified: Secondary | ICD-10-CM | POA: Diagnosis not present

## 2023-09-06 DIAGNOSIS — Z1722 Progesterone receptor negative status: Secondary | ICD-10-CM | POA: Insufficient documentation

## 2023-09-06 DIAGNOSIS — C50411 Malignant neoplasm of upper-outer quadrant of right female breast: Secondary | ICD-10-CM | POA: Diagnosis not present

## 2023-09-06 DIAGNOSIS — R42 Dizziness and giddiness: Secondary | ICD-10-CM | POA: Diagnosis not present

## 2023-09-06 DIAGNOSIS — Z9221 Personal history of antineoplastic chemotherapy: Secondary | ICD-10-CM | POA: Insufficient documentation

## 2023-09-06 DIAGNOSIS — Z5112 Encounter for antineoplastic immunotherapy: Secondary | ICD-10-CM | POA: Diagnosis not present

## 2023-09-06 DIAGNOSIS — Z923 Personal history of irradiation: Secondary | ICD-10-CM | POA: Insufficient documentation

## 2023-09-06 DIAGNOSIS — Z87891 Personal history of nicotine dependence: Secondary | ICD-10-CM | POA: Diagnosis not present

## 2023-09-06 DIAGNOSIS — R531 Weakness: Secondary | ICD-10-CM | POA: Diagnosis not present

## 2023-09-06 DIAGNOSIS — Z87440 Personal history of urinary (tract) infections: Secondary | ICD-10-CM | POA: Diagnosis not present

## 2023-09-06 DIAGNOSIS — Z95828 Presence of other vascular implants and grafts: Secondary | ICD-10-CM

## 2023-09-06 LAB — CMP (CANCER CENTER ONLY)
ALT: 21 U/L (ref 0–44)
AST: 19 U/L (ref 15–41)
Albumin: 3.7 g/dL (ref 3.5–5.0)
Alkaline Phosphatase: 71 U/L (ref 38–126)
Anion gap: 2 — ABNORMAL LOW (ref 5–15)
BUN: 13 mg/dL (ref 6–20)
CO2: 28 mmol/L (ref 22–32)
Calcium: 8.7 mg/dL — ABNORMAL LOW (ref 8.9–10.3)
Chloride: 107 mmol/L (ref 98–111)
Creatinine: 0.88 mg/dL (ref 0.44–1.00)
GFR, Estimated: >60 mL/min (ref >60)
Glucose, Bld: 94 mg/dL (ref 70–99)
Potassium: 3.9 mmol/L (ref 3.5–5.1)
Sodium: 137 mmol/L (ref 135–145)
Total Bilirubin: 0.3 mg/dL (ref 0.0–1.2)
Total Protein: 6.7 g/dL (ref 6.5–8.1)

## 2023-09-06 LAB — CBC WITH DIFFERENTIAL (CANCER CENTER ONLY)
Abs Immature Granulocytes: 0 K/uL (ref 0.00–0.07)
Basophils Absolute: 0 K/uL (ref 0.0–0.1)
Basophils Relative: 0 %
Eosinophils Absolute: 0.1 K/uL (ref 0.0–0.5)
Eosinophils Relative: 3 %
HCT: 35.8 % — ABNORMAL LOW (ref 36.0–46.0)
Hemoglobin: 11.5 g/dL — ABNORMAL LOW (ref 12.0–15.0)
Immature Granulocytes: 0 %
Lymphocytes Relative: 30 %
Lymphs Abs: 0.7 K/uL (ref 0.7–4.0)
MCH: 25.9 pg — ABNORMAL LOW (ref 26.0–34.0)
MCHC: 32.1 g/dL (ref 30.0–36.0)
MCV: 80.6 fL (ref 80.0–100.0)
Monocytes Absolute: 0.3 K/uL (ref 0.1–1.0)
Monocytes Relative: 14 %
Neutro Abs: 1.2 K/uL — ABNORMAL LOW (ref 1.7–7.7)
Neutrophils Relative %: 53 %
Platelet Count: 143 K/uL — ABNORMAL LOW (ref 150–400)
RBC: 4.44 MIL/uL (ref 3.87–5.11)
RDW: 16.5 % — ABNORMAL HIGH (ref 11.5–15.5)
WBC Count: 2.3 K/uL — ABNORMAL LOW (ref 4.0–10.5)
nRBC: 0 % (ref 0.0–0.2)

## 2023-09-06 LAB — PREGNANCY, URINE: Preg Test, Ur: NEGATIVE

## 2023-09-06 MED ORDER — ACETAMINOPHEN 325 MG PO TABS
650.0000 mg | ORAL_TABLET | Freq: Once | ORAL | Status: AC
  Administered 2023-09-06: 650 mg via ORAL
  Filled 2023-09-06: qty 2

## 2023-09-06 MED ORDER — SODIUM CHLORIDE 0.9% FLUSH: 10.0000 mL | Freq: Once | INTRAVENOUS | Status: DC

## 2023-09-06 MED ORDER — TRASTUZUMAB-ANNS CHEMO 150 MG IV SOLR
750.0000 mg | Freq: Once | INTRAVENOUS | Status: AC
  Administered 2023-09-06: 750 mg via INTRAVENOUS
  Filled 2023-09-06: qty 35.71

## 2023-09-06 MED ORDER — SODIUM CHLORIDE 0.9 % IV SOLN: INTRAVENOUS | Status: DC

## 2023-09-06 MED ORDER — DIPHENHYDRAMINE HCL 25 MG PO CAPS
50.0000 mg | ORAL_CAPSULE | Freq: Once | ORAL | Status: AC
  Administered 2023-09-06: 50 mg via ORAL
  Filled 2023-09-06: qty 2

## 2023-09-06 MED ORDER — SODIUM CHLORIDE 0.9% FLUSH: 10.0000 mL | INTRAVENOUS | Status: DC | PRN

## 2023-09-06 MED ORDER — SODIUM CHLORIDE 0.9 % IV SOLN
420.0000 mg | Freq: Once | INTRAVENOUS | Status: AC
  Administered 2023-09-06: 420 mg via INTRAVENOUS
  Filled 2023-09-06: qty 14

## 2023-09-06 NOTE — Patient Instructions (Signed)
 [facility] - A DEPT OF [facility]  Discharge Instructions: Thank you for choosing [facility] to provide your oncology and hematology care.   If you have a lab appointment with the Cancer Center, please go directly to the Cancer Center and check in at the registration area.   Wear comfortable clothing and clothing appropriate for easy access to any Portacath or PICC line.   We strive to give you quality time with your provider. You may need to reschedule your appointment if you arrive late (15 or more minutes).  Arriving late affects you and other patients whose appointments are after yours.  Also, if you miss three or more appointments without notifying the office, you may be dismissed from the clinic at the provider's discretion.      For prescription refill requests, have your pharmacy contact our office and allow 72 hours for refills to be completed.    Today you received the following chemotherapy and/or immunotherapy agents perjeta  and Kanjinti       To help prevent nausea and vomiting after your treatment, we encourage you to take your nausea medication as directed.  BELOW ARE SYMPTOMS THAT SHOULD BE REPORTED IMMEDIATELY: *FEVER GREATER THAN 100.4 F (38 C) OR HIGHER *CHILLS OR SWEATING *NAUSEA AND VOMITING THAT IS NOT CONTROLLED WITH YOUR NAUSEA MEDICATION *UNUSUAL SHORTNESS OF BREATH *UNUSUAL BRUISING OR BLEEDING *URINARY PROBLEMS (pain or burning when urinating, or frequent urination) *BOWEL PROBLEMS (unusual diarrhea, constipation, pain near the anus) TENDERNESS IN MOUTH AND THROAT WITH OR WITHOUT PRESENCE OF ULCERS (sore throat, sores in mouth, or a toothache) UNUSUAL RASH, SWELLING OR PAIN  UNUSUAL VAGINAL DISCHARGE OR ITCHING   Items with * indicate a potential emergency and should be followed up as soon as possible or go to the Emergency Department if any problems should occur.  Please show the CHEMOTHERAPY ALERT CARD or  IMMUNOTHERAPY ALERT CARD at check-in to the Emergency Department and triage nurse.  Should you have questions after your visit or need to cancel or reschedule your appointment, please contact [facility] - A DEPT OF [facility]  Dept: [phone]  and follow the prompts.  Office hours are 8:00 a.m. to 4:30 p.m. Monday - Friday. Please note that voicemails left after 4:00 p.m. may not be returned until the following business day.  We are closed weekends and major holidays. You have access to a nurse at all times for urgent questions. Please call the main number to the clinic Dept: [phone] and follow the prompts.   For any non-urgent questions, you may also contact your provider using MyChart. We now offer e-Visits for anyone 37 and older to request care online for non-urgent symptoms. For details visit [email or web address].   Also download the MyChart app! Go to the app store, search MyChart, open the app, select [place], and log in with your MyChart username and password.

## 2023-09-06 NOTE — Progress Notes (Unsigned)
 [facility] Cancer Follow up:    Kara Crigler, MD [address] [place] [address]   DIAGNOSIS: Cancer Staging  Malignant neoplasm of upper-outer quadrant of right breast in female, estrogen receptor negative (HCC) Staging form: Breast, AJCC 8th Edition - Clinical: Stage IIA (cT1c, cN1, cM0, G2, ER-, PR-, HER2+) - Signed by Odean Potts, MD on 12/27/2022 Stage prefix: Initial diagnosis Histologic grading system: 3 grade system    SUMMARY OF ONCOLOGIC HISTORY: Oncology History  Malignant neoplasm of upper-outer quadrant of right breast in female, estrogen receptor negative (HCC)  12/18/2022 Initial Diagnosis   Palpable right breast lump (irregular density) by ultrasound 1.3 cm at 9:30 position, ultrasound-guided biopsy: Grade 2 IDC no LVI, ER 0%, PR 0%, Ki67 30%, HER2 3+ positive, 1 axillary lymph node with cortical thickening biopsy: Positive   12/27/2022 Cancer Staging   Staging form: Breast, AJCC 8th Edition - Clinical: Stage IIA (cT1c, cN1, cM0, G2, ER-, PR-, HER2+) - Signed by Odean Potts, MD on 12/27/2022 Stage prefix: Initial diagnosis Histologic grading system: 3 grade system   01/11/2023 Genetic Testing   Negative [facility] Cancer[employer] +RNAinsight Panel.  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 Report date is 01/11/2023.   The [facility] CancerNext+RNAinsight Panel includes sequencing, rearrangement analysis, and RNA analysis for the following 39 genes: APC, ATM, BAP1, BARD1, BMPR1A, BRCA1, BRCA2, BRIP1, CDH1, CDKN2A, CHEK2, FH, FLCN, MET, MLH1, MSH2, MSH6, MUTYH, NF1, NTHL1, PALB2, PMS2, PTEN, RAD51C, RAD51D, SMAD4, STK11, TP53, TSC1, TSC2, and VHL (sequencing and deletion/duplication); AXIN2, HOXB13, MBD4, MSH3, POLD1 and POLE (sequencing only); EPCAM and GREM1 (deletion/duplication only).    01/12/2023 -  Chemotherapy   Patient is on Treatment Plan : BREAST  Docetaxel  + Carboplatin  + Trastuzumab  + Pertuzumab   (TCHP) q21d      06/05/2023 Surgery   Right  lumpectomy: Pathologic complete response   07/09/2023 - 08/20/2023 Radiation Therapy   Adjuvant XRT     CURRENT THERAPY: Herceptin /Perjeta   INTERVAL HISTORY:  Discussed the use of AI scribe software for clinical note transcription with the patient, who gave verbal consent to proceed.  Kara Webb 43 y.o. female returns for    Patient Active Problem List   Diagnosis Date Noted   Port-A-Cath in place 01/12/2023   Genetic testing 01/12/2023   Family history of breast cancer 12/27/2022   Family history of colon cancer 12/27/2022   Malignant neoplasm of upper-outer quadrant of right breast in female, estrogen receptor negative (HCC) 12/25/2022    is allergic to [facility]  dimeglumine].  MEDICAL HISTORY: Past Medical History:  Diagnosis Date   Breast cancer (HCC)    BV (bacterial vaginosis)    Cold intolerance 01/16/2005   Fatigue 01/16/2005   H/O chlamydia infection 08/12/2010   H/O urinary frequency 11/16/2000   H/O vaginal discharge 11/17/2003   H/O varicella    Heart murmur    Born with murmur   HSV-1 infection    Hx: UTI (urinary tract infection)    Pelvic pain    Vulvovaginal candidiasis 04/16/2005   Yeast infection     SURGICAL HISTORY: Past Surgical History:  Procedure Laterality Date   BREAST BIOPSY Right 12/18/2022   US  RT BREAST BX W LOC DEV 1ST LESION IMG BX SPEC US  GUIDE 12/18/2022 [facility]   BREAST BIOPSY  06/01/2023   US  RT RADIOACTIVE SEED LOC 06/01/2023 [facility]   BREAST BIOPSY  06/01/2023   MM RT RADIOACTIVE SEED LOC MAMMO GUIDE 06/01/2023 [facility]   BREAST BIOPSY  06/01/2023  MM RT RADIOACTIVE SEED EA ADD LESION LOC MAMMO GUIDE 06/01/2023 [facility]   BREAST LUMPECTOMY WITH RADIOACTIVE SEED AND SENTINEL LYMPH NODE BIOPSY Right 06/05/2023   Procedure: RIGHT BREAST SEED LUMPECTOMY x2, RIGHT SEED TARGETED LYMPH NODE BIOPSY, RIGHT SENTINEL LYMPH NODE MAPPING;  Surgeon: Vanderbilt Ned, MD;  Location: [facility];  Service: General;  Laterality: Right;  RIGHT BREAST SEED LUMPECTOMY x2, RIGHT SEED TARGETED LYMPH NODE BIOPSY, RIGHT SENTINEL LYMPH NODE MAPPING 90 MIN   IR CV LINE INJECTION  04/13/2023   PORTACATH PLACEMENT N/A 01/03/2023   Procedure: INSERTION PORT-A-CATH WITH ULTRASOUND GUIDEANCE;  Surgeon: Vanderbilt Ned, MD;  Location: [facility];  Service: General;  Laterality: N/A;   TOE SURGERY  2004   WISDOM TOOTH EXTRACTION  2003    SOCIAL HISTORY: Social History   Socioeconomic History   Marital status: Single    Spouse name: Not on file   Number of children: Not on file   Years of education: Not on file   Highest education level: Not on file  Occupational History   Not on file  Tobacco Use   Smoking status: Never   Smokeless tobacco: Never  Vaping Use   Vaping status: Former  Substance and Sexual Activity   Alcohol use: No   Drug use: No   Sexual activity: Yes    Birth control/protection: None  Other Topics Concern   Not on file  Social History Narrative   Not on file   Social Drivers of Health   Financial Resource Strain: Not on file  Food Insecurity: No Food Insecurity (06/28/2023)   Hunger Vital Sign    Worried About Running Out of Food in the Last Year: Never true    Ran Out of Food in the Last Year: Never true  Transportation Needs: No Transportation Needs (12/27/2022)   PRAPARE - Administrator, Civil Service (Medical): No    Lack of Transportation (Non-Medical): No  Physical Activity: Not on file  Stress: Not on file  Social Connections: Not on file  Intimate Partner Violence: Not At Risk (06/28/2023)   Humiliation, Afraid, Rape, and Kick questionnaire    Fear of Current or Ex-Partner: No    Emotionally Abused: No    Physically Abused: No    Sexually Abused: No    FAMILY HISTORY: Family History  Problem Relation Age of Onset   Breast cancer Maternal Aunt 67   Colon cancer Maternal Aunt 50   Colon cancer Maternal  Uncle 65   Hypertension Maternal Grandmother    Diabetes Maternal Grandmother    Hypertension Maternal Grandfather    Breast cancer Paternal Grandmother        dx mid 37s-70s    Review of Systems - Oncology    PHYSICAL EXAMINATION   Onc Performance Status - 09/06/23 1000       KPS SCALE   KPS % SCORE Normal, no compliants, no evidence of disease          Vitals:   09/06/23 1001  BP: (!) 135/94  Pulse: 91  Resp: 18  Temp: 98.2 F (36.8 C)  SpO2: 100%    Physical Exam  LABORATORY DATA:  CBC    Component Value Date/Time   WBC 2.3 (L) 09/06/2023 0936   WBC 3.2 (L) 01/15/2023 0732   RBC 4.44 09/06/2023 0936   HGB 11.5 (L) 09/06/2023 0936   HCT 35.8 (L) 09/06/2023 0936   PLT 143 (L) 09/06/2023 9063  MCV 80.6 09/06/2023 0936   MCH 25.9 (L) 09/06/2023 0936   MCHC 32.1 09/06/2023 0936   RDW 16.5 (H) 09/06/2023 0936   LYMPHSABS 0.7 09/06/2023 0936   MONOABS 0.3 09/06/2023 0936   EOSABS 0.1 09/06/2023 0936   BASOSABS 0.0 09/06/2023 0936    CMP     Component Value Date/Time   NA 138 07/26/2023 1351   K 3.7 07/26/2023 1351   CL 106 07/26/2023 1351   CO2 29 07/26/2023 1351   GLUCOSE 86 07/26/2023 1351   BUN 12 07/26/2023 1351   CREATININE 0.83 07/26/2023 1351   CALCIUM 9.1 07/26/2023 1351   PROT 7.2 07/26/2023 1351   ALBUMIN 3.7 07/26/2023 1351   AST 24 07/26/2023 1351   ALT 44 07/26/2023 1351   ALKPHOS 88 07/26/2023 1351   BILITOT 0.2 07/26/2023 1351   GFRNONAA >60 07/26/2023 1351   GFRAA >60 09/25/2018 0052     ASSESSMENT and THERAPY PLAN:   No problem-specific Assessment & Plan notes found for this encounter.     All questions were answered. The patient knows to call the clinic with any problems, questions or concerns. We can certainly see the patient much sooner if necessary.  Total encounter time:*** minutes*in face-to-face visit time, chart review, lab review, care coordination, order entry, and documentation of the encounter  time.    Morna Kendall, NP 09/06/23 10:13 AM Medical Oncology and Hematology [facility] [address] [place], [place] [address] Tel. [phone]    Fax. [phone]  *Total Encounter Time as defined by the Centers for Medicare and Medicaid Services includes, in addition to the face-to-face time of a patient visit (documented in the note above) non-face-to-face time: obtaining and reviewing outside history, ordering and reviewing medications, tests or procedures, care coordination (communications with other health care professionals or caregivers) and documentation in the medical record.

## 2023-09-07 ENCOUNTER — Encounter: Payer: Self-pay | Admitting: Hematology and Oncology

## 2023-09-07 ENCOUNTER — Other Ambulatory Visit: Payer: Self-pay

## 2023-09-07 NOTE — Assessment & Plan Note (Signed)
 12/18/2022:Palpable right breast lump (irregular density) by ultrasound 1.3 cm at 9:30 position, ultrasound-guided biopsy: Grade 2 IDC no LVI, ER 0%, PR 0%, Ki67 30%, HER2 3+ positive, 1 axillary lymph node with cortical thickening biopsy: Positive    Breast MRI 01/08/2023: 1.6 cm mass, 2.4 cm non-mass enhancement: Biopsy: DCIS   Treatment plan: Neoadjuvant chemotherapy with [facility] Perjeta  01/12/2023-04/26/2023 06/05/2023: Right lumpectomy: Negative for residual cancer, pathologic complete response 0/1 lymph node negative Adjuvant radiation therapy ----------------------------------------------------------------------------------------------------------------------------------------------- Current treatment: Herceptin  and Perjeta  maintenance   Assessment and Plan Assessment & Plan Right breast invasive ductal carcinoma, HER2 positive, ER/PR negative, stage IIA, status post neoadjuvant chemotherapy, lumpectomy, adjuvant radiation, and ongoing maintenance therapy HER2 positive, ER/PR negative, stage IIA right breast invasive ductal carcinoma. Status post neoadjuvant chemotherapy, lumpectomy, and adjuvant radiation. Echocardiogram in July 2025 showed left ventricular ejection fraction of 55-60%. No current cardiac symptoms reported. - Proceed with Herceptin /Perjeta  today - Order echocardiogram in October 2025 to monitor cardiac function during ongoing therapy. - Ensure continuation of maintenance therapy appointments through December 2025.  Fatigue and weakness following cancer treatment Experiencing fatigue and weakness post-radiation therapy. Reports increased fatigue since completing radiation. - Administer IV fluids for hydration today.  RTC in three weeks for Herceptin /Perjeta  and in 6 weeks for labs, f/u and Herceptin /Perjeta 

## 2023-09-09 ENCOUNTER — Other Ambulatory Visit: Payer: Self-pay

## 2023-09-10 ENCOUNTER — Ambulatory Visit: Payer: Self-pay | Attending: Hematology and Oncology

## 2023-09-10 VITALS — Wt 293.1 lb

## 2023-09-10 DIAGNOSIS — C50411 Malignant neoplasm of upper-outer quadrant of right female breast: Secondary | ICD-10-CM | POA: Insufficient documentation

## 2023-09-10 DIAGNOSIS — Z171 Estrogen receptor negative status [ER-]: Secondary | ICD-10-CM | POA: Insufficient documentation

## 2023-09-10 NOTE — Therapy (Signed)
 OUTPATIENT PHYSICAL THERAPY SOZO SCREENING NOTE   Patient Name: Kara Webb MRN: [id number] DOB:[birth year], 43 y.o., female Today's Date: 09/10/2023  PCP: Ilah Crigler, MD REFERRING PROVIDER: Odean Potts, MD   PT End of Session - 09/10/23 1707     Visit Number 2   # unchanged due to screen only   PT Start Time 1706    PT Stop Time 1710    PT Time Calculation (min) 4 min    Activity Tolerance Patient tolerated treatment well    Behavior During Therapy [facility] for tasks assessed/performed          Past Medical History:  Diagnosis Date   Breast cancer (HCC)    BV (bacterial vaginosis)    Cold intolerance 01/16/2005   Fatigue 01/16/2005   H/O chlamydia infection 08/12/2010   H/O urinary frequency 11/16/2000   H/O vaginal discharge 11/17/2003   H/O varicella    Heart murmur    Born with murmur   HSV-1 infection    Hx: UTI (urinary tract infection)    Pelvic pain    Vulvovaginal candidiasis 04/16/2005   Yeast infection    Past Surgical History:  Procedure Laterality Date   BREAST BIOPSY Right 12/18/2022   US  RT BREAST BX W LOC DEV 1ST LESION IMG BX SPEC US  GUIDE 12/18/2022 [facility]   BREAST BIOPSY  06/01/2023   US  RT RADIOACTIVE SEED LOC 06/01/2023 [facility]   BREAST BIOPSY  06/01/2023   MM RT RADIOACTIVE SEED LOC MAMMO GUIDE 06/01/2023 [facility]   BREAST BIOPSY  06/01/2023   MM RT RADIOACTIVE SEED EA ADD LESION LOC MAMMO GUIDE 06/01/2023 [facility]   BREAST LUMPECTOMY WITH RADIOACTIVE SEED AND SENTINEL LYMPH NODE BIOPSY Right 06/05/2023   Procedure: RIGHT BREAST SEED LUMPECTOMY x2, RIGHT SEED TARGETED LYMPH NODE BIOPSY, RIGHT SENTINEL LYMPH NODE MAPPING;  Surgeon: Vanderbilt Ned, MD;  Location: [facility];  Service: General;  Laterality: Right;  RIGHT BREAST SEED LUMPECTOMY x2, RIGHT SEED TARGETED LYMPH NODE BIOPSY, RIGHT SENTINEL LYMPH NODE MAPPING 90 MIN   IR CV LINE INJECTION  04/13/2023   PORTACATH PLACEMENT N/A 01/03/2023    Procedure: INSERTION PORT-A-CATH WITH ULTRASOUND GUIDEANCE;  Surgeon: Vanderbilt Ned, MD;  Location: [facility];  Service: General;  Laterality: N/A;   TOE SURGERY  2004   WISDOM TOOTH EXTRACTION  2003   Patient Active Problem List   Diagnosis Date Noted   Port-A-Cath in place 01/12/2023   Genetic testing 01/12/2023   Family history of breast cancer 12/27/2022   Family history of colon cancer 12/27/2022   Malignant neoplasm of upper-outer quadrant of right breast in female, estrogen receptor negative (HCC) 12/25/2022    REFERRING DIAG: right breast cancer at risk for lymphedema  THERAPY DIAG:  Malignant neoplasm of upper-outer quadrant of right breast in female, estrogen receptor negative (HCC)  PERTINENT HISTORY: Patient was diagnosed on 12/12/2022 with right grade 2 invasive ductal carcinoma breast cancer. It measures 1.3 cm and is located in the upper outer quadrant. It is ER/PR negative and HER2 positive with a Ki67 of 30%. She has a positive axillary lymph node. Completed neoadjuvant chemo. Underwent R breast lumpectomy and SLNB 0/1 on 06/05/23.  PRECAUTIONS: right UE Lymphedema risk, None  SUBJECTIVE: Pt returns for her first 3 month L-Dex screen. Can I get a script for a compression sleeve and glove? My insurance will reimburse me for the on eI already purchased.  PAIN:  Are you having pain? No   SOZO  SCREENING: Patient was assessed today using the SOZO machine to determine the lymphedema index score. This was compared to her baseline score. It was determined that she is within the recommended range when compared to her baseline and no further action is needed at this time. She will continue SOZO screenings. These are done every 3 months for 2 years post operatively followed by every 6 months for 2 years, and then annually.  Issued script for compression sleeve and glove from Morna Kendall, NP.    L-DEX FLOWSHEETS - 09/10/23 1700       L-DEX LYMPHEDEMA  SCREENING   Measurement Type Unilateral    L-DEX MEASUREMENT EXTREMITY Upper Extremity    POSITION  Standing    DOMINANT SIDE Right    At Risk Side Right    BASELINE SCORE (UNILATERAL) -9.5    L-DEX SCORE (UNILATERAL) -4    VALUE CHANGE (UNILAT) 5.5            Aden Berwyn Caldron, PTA 09/10/2023, 5:12 PM

## 2023-09-11 ENCOUNTER — Other Ambulatory Visit: Payer: Self-pay

## 2023-09-18 ENCOUNTER — Telehealth: Payer: Self-pay | Admitting: *Deleted

## 2023-09-18 NOTE — Telephone Encounter (Signed)
 Received call from pt stating she wants to bleach her hair and requesting advice from MD if okay to proceed.  Per MD, okay to proceed. Pt educated and verbalized understanding.

## 2023-09-19 ENCOUNTER — Encounter: Payer: Self-pay | Admitting: Radiation Oncology

## 2023-09-19 NOTE — Progress Notes (Signed)
 Radiation Oncology         [phone] ________________________________  Name: Kara Webb MRN: [id number]  Date: 09/20/2023  DOB: Apr 06, 1980  Follow-Up Visit Note  CC: Ilah Crigler, MD  Ilah Crigler, MD  No diagnosis found.  Diagnosis: Stage IIA (cT1c, cN1, cM0) Invasive Ductal Carcinoma of the Right Breast, ER-/PR-/HER2+   Interval Since Last Radiation:  1 month  Intent: curative  First Treatment Date: 2023-07-05 Last Treatment Date: 2023-08-20   Plan Name: Breast_R Site: Breast, Right Technique: 3D Mode: Photon Dose Per Fraction: 2 Gy Prescribed Dose (Delivered / Prescribed): 50 Gy / 50 Gy Prescribed Fxs (Delivered / Prescribed): 25 / 25   Plan Name: Breast_R_Bst Site: Breast, Right Technique: 3D Mode: Photon Dose Per Fraction: 2 Gy Prescribed Dose (Delivered / Prescribed): 12 Gy / 12 Gy Prescribed Fxs (Delivered / Prescribed): 6 / 6  Narrative:  The patient returns today for routine follow-up. She was last seen in office on 06/28/23 for a consultation visit. Since then, patient completed her radiation treatment which she tolerated quite well. Patient did however endorse experiencing anticipated skin changes along with fatigue and weakness during her treatment.   In the interval since she was last seen, she presented for a follow up visit with Dr. Vanderbilt on 06/29/23 during which she reported recovering from her procedure quite well without signs of infections.   On 07/26/23 patient was seen by Dr. Odean complaining of fatigue and weakness for which she attributed to her radiation treatment. She presented for a follow up with NP prior to receiving Herceptin /Perjeta . At that time, treatment was initiated and will continue till December of 2025.    No other significant oncologic interval history since the patient was last seen.                                Allergies:  is allergic to [facility]  dimeglumine].  Meds: Current Outpatient Medications   Medication Sig Dispense Refill   ALPRAZolam  (XANAX ) 0.5 MG tablet Take 1 tablet (0.5 mg total) by mouth at bedtime as needed for anxiety or sleep. 20 tablet 3   amoxicillin (AMOXIL) 500 MG capsule Take 500 mg by mouth 2 (two) times daily.     azithromycin  (ZITHROMAX  Z-PAK) 250 MG tablet Per instructions. Only take if symptoms worsen and flu/covid negative, notify cancer center prior to starting 6 each 0   ferrous gluconate  (FERGON) 324 MG tablet Take 1 tablet (324 mg total) by mouth 2 (two) times daily with a meal. 60 tablet 6   ibuprofen  (ADVIL ) 800 MG tablet Take 1 tablet (800 mg total) by mouth every 8 (eight) hours as needed. 30 tablet 0   IUD'S IU IUD's     lidocaine -prilocaine  (EMLA ) cream Apply to affected area once 30 g 3   ondansetron  (ZOFRAN ) 8 MG tablet Take 1 tablet (8 mg total) by mouth every 8 (eight) hours as needed for nausea or vomiting. Start on the third day after chemotherapy. 30 tablet 1   prochlorperazine  (COMPAZINE ) 10 MG tablet Take 1 tablet (10 mg total) by mouth every 6 (six) hours as needed for nausea or vomiting. 30 tablet 1   No current facility-administered medications for this encounter.    Physical Findings: The patient is in no acute distress. Patient is alert and oriented.  vitals were not taken for this visit. .  No significant changes. Lungs are clear to auscultation bilaterally. Heart has regular rate  and rhythm. No palpable cervical, supraclavicular, or axillary adenopathy. Abdomen soft, non-tender, normal bowel sounds.   Lab Findings: Lab Results  Component Value Date   WBC 2.3 (L) 09/06/2023   HGB 11.5 (L) 09/06/2023   HCT 35.8 (L) 09/06/2023   MCV 80.6 09/06/2023   PLT 143 (L) 09/06/2023    Radiographic Findings: No results found.  Impression:  Stage IIA (cT1c, cN1, cM0) Invasive Ductal Carcinoma of the Right Breast, ER-/PR-/HER2+   The patient is recovering from the effects of radiation.  ***  Plan:  ***   *** minutes of total time  was spent for this patient encounter, including preparation, face-to-face counseling with the patient and coordination of care, physical exam, and documentation of the encounter. ____________________________________  Lynwood CHARM Nasuti, PhD, MD  This document serves as a record of services personally performed by Lynwood Nasuti, MD. It was created on his behalf by Reymundo Cartwright, a trained medical scribe. The creation of this record is based on the scribe's personal observations and the provider's statements to them. This document has been checked and approved by the attending provider.

## 2023-09-20 ENCOUNTER — Other Ambulatory Visit: Payer: Self-pay

## 2023-09-20 ENCOUNTER — Ambulatory Visit: Admitting: Radiation Oncology

## 2023-09-20 ENCOUNTER — Encounter: Payer: Self-pay | Admitting: Radiation Oncology

## 2023-09-20 ENCOUNTER — Ambulatory Visit
Admission: RE | Admit: 2023-09-20 | Discharge: 2023-09-20 | Disposition: A | Discharge status: Home / Self Care | Source: Ambulatory Visit | Attending: Radiation Oncology | Admitting: Radiation Oncology

## 2023-09-20 VITALS — BP 132/99 | HR 89 | Temp 97.9°F | Resp 20 | Ht 66.0 in | Wt 292.6 lb

## 2023-09-20 DIAGNOSIS — Z79899 Other long term (current) drug therapy: Secondary | ICD-10-CM | POA: Insufficient documentation

## 2023-09-20 DIAGNOSIS — Z923 Personal history of irradiation: Secondary | ICD-10-CM | POA: Insufficient documentation

## 2023-09-20 DIAGNOSIS — C50411 Malignant neoplasm of upper-outer quadrant of right female breast: Secondary | ICD-10-CM | POA: Insufficient documentation

## 2023-09-20 DIAGNOSIS — R5383 Other fatigue: Secondary | ICD-10-CM | POA: Diagnosis not present

## 2023-09-20 DIAGNOSIS — Z171 Estrogen receptor negative status [ER-]: Secondary | ICD-10-CM | POA: Diagnosis not present

## 2023-09-20 HISTORY — DX: Personal history of irradiation: Z92.3

## 2023-09-20 NOTE — Progress Notes (Signed)
 Kara Webb is here today for follow up post radiation to the breast.   Breast Side:Right   They completed their radiation on: 08/20/23   Does the patient complain of any of the following: Post radiation skin issues: Yes, continues to have itching to breast.  Breast Tenderness:  Reports intermittent sharp pains.  Breast Swelling: No Lymphadema: No, patient had SOZO screening last week.  Range of Motion limitations: No Fatigue post radiation: Yes continues to have mild fatigue.  Appetite good/fair/poor: Good   Additional comments if applicable:   BP (!) 132/99 (BP Location: Right Arm, Patient Position: Sitting, Cuff Size: Large)   Pulse 89   Temp 97.9 F (36.6 C)   Resp 20   Ht 5' 6 (1.676 m)   Wt 292 lb 9.6 oz (132.7 kg)   SpO2 100%   BMI 47.23 kg/m

## 2023-09-27 ENCOUNTER — Encounter: Payer: Self-pay | Admitting: *Deleted

## 2023-09-27 ENCOUNTER — Inpatient Hospital Stay: Attending: Hematology and Oncology

## 2023-09-27 ENCOUNTER — Ambulatory Visit

## 2023-09-27 VITALS — BP 126/87 | HR 88 | Temp 97.8°F | Resp 19 | Wt 294.2 lb

## 2023-09-27 DIAGNOSIS — Z5111 Encounter for antineoplastic chemotherapy: Secondary | ICD-10-CM | POA: Diagnosis not present

## 2023-09-27 DIAGNOSIS — Z5112 Encounter for antineoplastic immunotherapy: Secondary | ICD-10-CM | POA: Insufficient documentation

## 2023-09-27 DIAGNOSIS — C50411 Malignant neoplasm of upper-outer quadrant of right female breast: Secondary | ICD-10-CM | POA: Insufficient documentation

## 2023-09-27 MED ORDER — DIPHENHYDRAMINE HCL 25 MG PO CAPS
50.0000 mg | ORAL_CAPSULE | Freq: Once | ORAL | Status: AC
  Administered 2023-09-27: 50 mg via ORAL
  Filled 2023-09-27: qty 2

## 2023-09-27 MED ORDER — TRASTUZUMAB-ANNS CHEMO 150 MG IV SOLR
750.0000 mg | Freq: Once | INTRAVENOUS | Status: AC
  Administered 2023-09-27: 750 mg via INTRAVENOUS
  Filled 2023-09-27: qty 35.71

## 2023-09-27 MED ORDER — SODIUM CHLORIDE 0.9 % IV SOLN: INTRAVENOUS | Status: DC

## 2023-09-27 MED ORDER — SODIUM CHLORIDE 0.9 % IV SOLN
420.0000 mg | Freq: Once | INTRAVENOUS | Status: AC
  Administered 2023-09-27: 420 mg via INTRAVENOUS
  Filled 2023-09-27: qty 14

## 2023-09-27 MED ORDER — ACETAMINOPHEN 325 MG PO TABS
650.0000 mg | ORAL_TABLET | Freq: Once | ORAL | Status: AC
  Administered 2023-09-27: 650 mg via ORAL
  Filled 2023-09-27: qty 2

## 2023-10-12 ENCOUNTER — Encounter: Payer: Self-pay | Admitting: Hematology and Oncology

## 2023-10-17 ENCOUNTER — Ambulatory Visit (HOSPITAL_COMMUNITY)
Admission: RE | Admit: 2023-10-17 | Discharge: 2023-10-17 | Disposition: A | Discharge status: Home / Self Care | Source: Ambulatory Visit | Attending: Adult Health | Admitting: Adult Health

## 2023-10-17 DIAGNOSIS — C50411 Malignant neoplasm of upper-outer quadrant of right female breast: Secondary | ICD-10-CM | POA: Diagnosis not present

## 2023-10-17 DIAGNOSIS — Z171 Estrogen receptor negative status [ER-]: Secondary | ICD-10-CM | POA: Insufficient documentation

## 2023-10-17 DIAGNOSIS — Z923 Personal history of irradiation: Secondary | ICD-10-CM | POA: Insufficient documentation

## 2023-10-17 DIAGNOSIS — Z9221 Personal history of antineoplastic chemotherapy: Secondary | ICD-10-CM | POA: Insufficient documentation

## 2023-10-17 DIAGNOSIS — Z0189 Encounter for other specified special examinations: Secondary | ICD-10-CM | POA: Diagnosis not present

## 2023-10-17 LAB — ECHOCARDIOGRAM COMPLETE
Area-P 1/2: 3.54 cm2
S' Lateral: 3.2 cm
Single Plane A2C EF: 59.2 %

## 2023-10-17 NOTE — Progress Notes (Signed)
  Echocardiogram 2D Echocardiogram has been performed.  Koleen KANDICE Popper, RDCS 10/17/2023, 11:52 AM

## 2023-10-18 ENCOUNTER — Ambulatory Visit: Admitting: Hematology and Oncology

## 2023-10-18 ENCOUNTER — Inpatient Hospital Stay

## 2023-10-18 ENCOUNTER — Other Ambulatory Visit

## 2023-10-18 ENCOUNTER — Inpatient Hospital Stay: Admitting: Hematology and Oncology

## 2023-10-18 ENCOUNTER — Inpatient Hospital Stay: Attending: Hematology and Oncology

## 2023-10-18 ENCOUNTER — Ambulatory Visit

## 2023-10-18 VITALS — BP 129/89 | HR 87 | Temp 97.9°F | Resp 18 | Ht 66.0 in | Wt 292.9 lb

## 2023-10-18 DIAGNOSIS — C50411 Malignant neoplasm of upper-outer quadrant of right female breast: Secondary | ICD-10-CM | POA: Insufficient documentation

## 2023-10-18 DIAGNOSIS — L299 Pruritus, unspecified: Secondary | ICD-10-CM | POA: Diagnosis not present

## 2023-10-18 DIAGNOSIS — Z79899 Other long term (current) drug therapy: Secondary | ICD-10-CM | POA: Insufficient documentation

## 2023-10-18 DIAGNOSIS — Z923 Personal history of irradiation: Secondary | ICD-10-CM | POA: Diagnosis not present

## 2023-10-18 DIAGNOSIS — Z171 Estrogen receptor negative status [ER-]: Secondary | ICD-10-CM

## 2023-10-18 DIAGNOSIS — D259 Leiomyoma of uterus, unspecified: Secondary | ICD-10-CM | POA: Diagnosis not present

## 2023-10-18 DIAGNOSIS — R531 Weakness: Secondary | ICD-10-CM | POA: Insufficient documentation

## 2023-10-18 DIAGNOSIS — Z9221 Personal history of antineoplastic chemotherapy: Secondary | ICD-10-CM | POA: Insufficient documentation

## 2023-10-18 DIAGNOSIS — Z86018 Personal history of other benign neoplasm: Secondary | ICD-10-CM | POA: Insufficient documentation

## 2023-10-18 DIAGNOSIS — Z1732 Human epidermal growth factor receptor 2 negative status: Secondary | ICD-10-CM | POA: Insufficient documentation

## 2023-10-18 DIAGNOSIS — N644 Mastodynia: Secondary | ICD-10-CM | POA: Insufficient documentation

## 2023-10-18 DIAGNOSIS — T451X5A Adverse effect of antineoplastic and immunosuppressive drugs, initial encounter: Secondary | ICD-10-CM | POA: Diagnosis not present

## 2023-10-18 DIAGNOSIS — Z5112 Encounter for antineoplastic immunotherapy: Secondary | ICD-10-CM | POA: Diagnosis not present

## 2023-10-18 DIAGNOSIS — Z1722 Progesterone receptor negative status: Secondary | ICD-10-CM | POA: Diagnosis not present

## 2023-10-18 DIAGNOSIS — D72819 Decreased white blood cell count, unspecified: Secondary | ICD-10-CM | POA: Insufficient documentation

## 2023-10-18 LAB — CMP (CANCER CENTER ONLY)
ALT: 27 U/L (ref 0–44)
AST: 27 U/L (ref 15–41)
Albumin: 3.7 g/dL (ref 3.5–5.0)
Alkaline Phosphatase: 54 U/L (ref 38–126)
Anion gap: 4 — ABNORMAL LOW (ref 5–15)
BUN: 14 mg/dL (ref 6–20)
CO2: 27 mmol/L (ref 22–32)
Calcium: 8.8 mg/dL — ABNORMAL LOW (ref 8.9–10.3)
Chloride: 107 mmol/L (ref 98–111)
Creatinine: 0.94 mg/dL (ref 0.44–1.00)
GFR, Estimated: >60 mL/min (ref >60)
Glucose, Bld: 106 mg/dL — ABNORMAL HIGH (ref 70–99)
Potassium: 3.7 mmol/L (ref 3.5–5.1)
Sodium: 138 mmol/L (ref 135–145)
Total Bilirubin: 0.3 mg/dL (ref 0.0–1.2)
Total Protein: 7.1 g/dL (ref 6.5–8.1)

## 2023-10-18 LAB — CBC WITH DIFFERENTIAL (CANCER CENTER ONLY)
Abs Immature Granulocytes: 0.01 K/uL (ref 0.00–0.07)
Basophils Absolute: 0 K/uL (ref 0.0–0.1)
Basophils Relative: 0 %
Eosinophils Absolute: 0.1 K/uL (ref 0.0–0.5)
Eosinophils Relative: 3 %
HCT: 36.1 % (ref 36.0–46.0)
Hemoglobin: 11.8 g/dL — ABNORMAL LOW (ref 12.0–15.0)
Immature Granulocytes: 0 %
Lymphocytes Relative: 28 %
Lymphs Abs: 0.7 K/uL (ref 0.7–4.0)
MCH: 26.2 pg (ref 26.0–34.0)
MCHC: 32.7 g/dL (ref 30.0–36.0)
MCV: 80 fL (ref 80.0–100.0)
Monocytes Absolute: 0.4 K/uL (ref 0.1–1.0)
Monocytes Relative: 14 %
Neutro Abs: 1.5 K/uL — ABNORMAL LOW (ref 1.7–7.7)
Neutrophils Relative %: 55 %
Platelet Count: 146 K/uL — ABNORMAL LOW (ref 150–400)
RBC: 4.51 MIL/uL (ref 3.87–5.11)
RDW: 15.7 % — ABNORMAL HIGH (ref 11.5–15.5)
WBC Count: 2.7 K/uL — ABNORMAL LOW (ref 4.0–10.5)
nRBC: 0 % (ref 0.0–0.2)

## 2023-10-18 LAB — PREGNANCY, URINE: Preg Test, Ur: NEGATIVE

## 2023-10-18 MED ORDER — DIPHENHYDRAMINE HCL 25 MG PO CAPS
50.0000 mg | ORAL_CAPSULE | Freq: Once | ORAL | Status: AC
  Administered 2023-10-18: 50 mg via ORAL
  Filled 2023-10-18: qty 2

## 2023-10-18 MED ORDER — TRASTUZUMAB-ANNS CHEMO 150 MG IV SOLR
750.0000 mg | Freq: Once | INTRAVENOUS | Status: AC
  Administered 2023-10-18: 750 mg via INTRAVENOUS
  Filled 2023-10-18: qty 35.71

## 2023-10-18 MED ORDER — SODIUM CHLORIDE 0.9 % IV SOLN
420.0000 mg | Freq: Once | INTRAVENOUS | Status: AC
  Administered 2023-10-18: 420 mg via INTRAVENOUS
  Filled 2023-10-18: qty 14

## 2023-10-18 MED ORDER — ACETAMINOPHEN 325 MG PO TABS
650.0000 mg | ORAL_TABLET | Freq: Once | ORAL | Status: AC
  Administered 2023-10-18: 650 mg via ORAL
  Filled 2023-10-18: qty 2

## 2023-10-18 MED ORDER — SODIUM CHLORIDE 0.9 % IV SOLN: INTRAVENOUS | Status: DC

## 2023-10-18 NOTE — Patient Instructions (Signed)
 [facility] - A DEPT OF [facility]  Discharge Instructions: Thank you for choosing [facility] to provide your oncology and hematology care.   If you have a lab appointment with the Cancer Center, please go directly to the Cancer Center and check in at the registration area.   Wear comfortable clothing and clothing appropriate for easy access to any Portacath or PICC line.   We strive to give you quality time with your provider. You may need to reschedule your appointment if you arrive late (15 or more minutes).  Arriving late affects you and other patients whose appointments are after yours.  Also, if you miss three or more appointments without notifying the office, you may be dismissed from the clinic at the provider's discretion.      For prescription refill requests, have your pharmacy contact our office and allow 72 hours for refills to be completed.    Today you received the following chemotherapy and/or immunotherapy agents perjeta  and Kanjinti       To help prevent nausea and vomiting after your treatment, we encourage you to take your nausea medication as directed.  BELOW ARE SYMPTOMS THAT SHOULD BE REPORTED IMMEDIATELY: *FEVER GREATER THAN 100.4 F (38 C) OR HIGHER *CHILLS OR SWEATING *NAUSEA AND VOMITING THAT IS NOT CONTROLLED WITH YOUR NAUSEA MEDICATION *UNUSUAL SHORTNESS OF BREATH *UNUSUAL BRUISING OR BLEEDING *URINARY PROBLEMS (pain or burning when urinating, or frequent urination) *BOWEL PROBLEMS (unusual diarrhea, constipation, pain near the anus) TENDERNESS IN MOUTH AND THROAT WITH OR WITHOUT PRESENCE OF ULCERS (sore throat, sores in mouth, or a toothache) UNUSUAL RASH, SWELLING OR PAIN  UNUSUAL VAGINAL DISCHARGE OR ITCHING   Items with * indicate a potential emergency and should be followed up as soon as possible or go to the Emergency Department if any problems should occur.  Please show the CHEMOTHERAPY ALERT CARD or  IMMUNOTHERAPY ALERT CARD at check-in to the Emergency Department and triage nurse.  Should you have questions after your visit or need to cancel or reschedule your appointment, please contact [facility] - A DEPT OF [facility]  Dept: [phone]  and follow the prompts.  Office hours are 8:00 a.m. to 4:30 p.m. Monday - Friday. Please note that voicemails left after 4:00 p.m. may not be returned until the following business day.  We are closed weekends and major holidays. You have access to a nurse at all times for urgent questions. Please call the main number to the clinic Dept: [phone] and follow the prompts.   For any non-urgent questions, you may also contact your provider using MyChart. We now offer e-Visits for anyone 37 and older to request care online for non-urgent symptoms. For details visit [email or web address].   Also download the MyChart app! Go to the app store, search MyChart, open the app, select [place], and log in with your MyChart username and password.

## 2023-10-18 NOTE — Assessment & Plan Note (Signed)
 12/18/2022:Palpable right breast lump (irregular density) by ultrasound 1.3 cm at 9:30 position, ultrasound-guided biopsy: Grade 2 IDC no LVI, ER 0%, PR 0%, Ki67 30%, HER2 3+ positive, 1 axillary lymph node with cortical thickening biopsy: Positive    Breast MRI 01/08/2023: 1.6 cm mass, 2.4 cm non-mass enhancement: Biopsy: DCIS   Treatment plan: Neoadjuvant chemotherapy with [facility] Perjeta  01/12/2023-04/26/2023 06/05/2023: Right lumpectomy: Negative for residual cancer, pathologic complete response 0/1 lymph node negative Adjuvant radiation therapy completed 08/20/2023 ----------------------------------------------------------------------------------------------------------------------------------------------- Current treatment: Herceptin  and Perjeta  maintenance until 12/20/2023 Toxicities: Fatigue and weakness: Gradually improving  Fibroids: Causing heavy menstrual cycles: Patient to discuss oophorectomy with her GYN   Return to clinic every 3 weeks for Herceptin  and Perjeta  every 6 weeks and follow-up with me

## 2023-10-18 NOTE — Progress Notes (Signed)
 Patient Care Team: Ilah Crigler, MD as PCP - General (Family Medicine) Tyree Nanetta SAILOR, RN as Oncology Nurse Navigator Shannon Agent, MD as Consulting Physician (Radiation Oncology) Odean Potts, MD as Consulting Physician (Hematology and Oncology) Vanderbilt Ned, MD as Consulting Physician (General Surgery)  DIAGNOSIS:  Encounter Diagnosis  Name Primary?   Malignant neoplasm of upper-outer quadrant of right breast in female, estrogen receptor negative (HCC) Yes    SUMMARY OF ONCOLOGIC HISTORY: Oncology History  Malignant neoplasm of upper-outer quadrant of right breast in female, estrogen receptor negative (HCC)  12/18/2022 Initial Diagnosis   Palpable right breast lump (irregular density) by ultrasound 1.3 cm at 9:30 position, ultrasound-guided biopsy: Grade 2 IDC no LVI, ER 0%, PR 0%, Ki67 30%, HER2 3+ positive, 1 axillary lymph node with cortical thickening biopsy: Positive   12/27/2022 Cancer Staging   Staging form: Breast, AJCC 8th Edition - Clinical: Stage IIA (cT1c, cN1, cM0, G2, ER-, PR-, HER2+) - Signed by Odean Potts, MD on 12/27/2022 Stage prefix: Initial diagnosis Histologic grading system: 3 grade system   01/11/2023 Genetic Testing   Negative [facility] CancerNext +RNAinsight Panel.  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 Report date is 01/11/2023.   The [facility] CancerNext+RNAinsight Panel includes sequencing, rearrangement analysis, and RNA analysis for the following 39 genes: APC, ATM, BAP1, BARD1, BMPR1A, BRCA1, BRCA2, BRIP1, CDH1, CDKN2A, CHEK2, FH, FLCN, MET, MLH1, MSH2, MSH6, MUTYH, NF1, NTHL1, PALB2, PMS2, PTEN, RAD51C, RAD51D, SMAD4, STK11, TP53, TSC1, TSC2, and VHL (sequencing and deletion/duplication); AXIN2, HOXB13, MBD4, MSH3, POLD1 and POLE (sequencing only); EPCAM and GREM1 (deletion/duplication only).    01/12/2023 -  Chemotherapy   Patient is on Treatment Plan : BREAST  Docetaxel  + Carboplatin  + Trastuzumab  + Pertuzumab   (TCHP) q21d      06/05/2023  Surgery   Right lumpectomy: Pathologic complete response   07/09/2023 - 08/20/2023 Radiation Therapy   Adjuvant XRT     CHIEF COMPLIANT: Follow-up on Herceptin  and Perjeta   HISTORY OF PRESENT ILLNESS:   History of Present Illness Kara Webb is a 43 year old female with breast cancer who presents for follow-up after completing radiation therapy.  She has completed radiation therapy and experienced skin breakdown during treatment, which has healed. She currently experiences tenderness in the nipple area and occasional itchiness, managed with a gel. No significant fatigue is present.  Gastrointestinal symptoms include increased bowel movements after consuming fried foods, though not described as diarrhea. These symptoms are occasional and inconsistent.  Her menstrual cycle shows improvement with less bleeding compared to previous cycles, despite a history of fibroids and associated bleeding issues.     ALLERGIES:  is allergic to [facility]  dimeglumine].  MEDICATIONS:  Current Outpatient Medications  Medication Sig Dispense Refill   ALPRAZolam  (XANAX ) 0.5 MG tablet Take 1 tablet (0.5 mg total) by mouth at bedtime as needed for anxiety or sleep. 20 tablet 3   amoxicillin (AMOXIL) 500 MG capsule Take 500 mg by mouth 2 (two) times daily.     ferrous gluconate  (FERGON) 324 MG tablet Take 1 tablet (324 mg total) by mouth 2 (two) times daily with a meal. 60 tablet 6   ibuprofen  (ADVIL ) 800 MG tablet Take 1 tablet (800 mg total) by mouth every 8 (eight) hours as needed. 30 tablet 0   IUD'S IU IUD's     lidocaine -prilocaine  (EMLA ) cream Apply to affected area once 30 g 3   ondansetron  (ZOFRAN ) 8 MG tablet Take 1 tablet (8 mg total) by mouth every 8 (eight) hours  as needed for nausea or vomiting. Start on the third day after chemotherapy. 30 tablet 1   prochlorperazine  (COMPAZINE ) 10 MG tablet Take 1 tablet (10 mg total) by mouth every 6 (six) hours as needed for nausea or vomiting. 30  tablet 1   No current facility-administered medications for this visit.    PHYSICAL EXAMINATION: ECOG PERFORMANCE STATUS: 1 - Symptomatic but completely ambulatory  Vitals:   10/18/23 0836  BP: 129/89  Pulse: 87  Resp: 18  Temp: 97.9 F (36.6 C)  SpO2: 100%   Filed Weights   10/18/23 0836  Weight: 292 lb 14.4 oz (132.9 kg)      LABORATORY DATA:  I have reviewed the data as listed    Latest Ref Rng & Units 09/06/2023    9:36 AM 07/26/2023    1:51 PM 06/14/2023   10:26 AM  CMP  Glucose 70 - 99 mg/dL 94  86  897   BUN 6 - 20 mg/dL 13  12  15    Creatinine 0.44 - 1.00 mg/dL 9.11  9.16  9.19   Sodium 135 - 145 mmol/L 137  138  138   Potassium 3.5 - 5.1 mmol/L 3.9  3.7  3.8   Chloride 98 - 111 mmol/L 107  106  106   CO2 22 - 32 mmol/L 28  29  28    Calcium 8.9 - 10.3 mg/dL 8.7  9.1  9.3   Total Protein 6.5 - 8.1 g/dL 6.7  7.2  7.2   Total Bilirubin 0.0 - 1.2 mg/dL 0.3  0.2  0.2   Alkaline Phos 38 - 126 U/L 71  88  113   AST 15 - 41 U/L 19  24  39   ALT 0 - 44 U/L 21  44  70     Lab Results  Component Value Date   WBC 2.7 (L) 10/18/2023   HGB 11.8 (L) 10/18/2023   HCT 36.1 10/18/2023   MCV 80.0 10/18/2023   PLT 146 (L) 10/18/2023   NEUTROABS 1.5 (L) 10/18/2023    ASSESSMENT & PLAN:  Malignant neoplasm of upper-outer quadrant of right breast in female, estrogen receptor negative (HCC) 12/18/2022:Palpable right breast lump (irregular density) by ultrasound 1.3 cm at 9:30 position, ultrasound-guided biopsy: Grade 2 IDC no LVI, ER 0%, PR 0%, Ki67 30%, HER2 3+ positive, 1 axillary lymph node with cortical thickening biopsy: Positive    Breast MRI 01/08/2023: 1.6 cm mass, 2.4 cm non-mass enhancement: Biopsy: DCIS   Treatment plan: Neoadjuvant chemotherapy with [facility] Perjeta  01/12/2023-04/26/2023 06/05/2023: Right lumpectomy: Negative for residual cancer, pathologic complete response 0/1 lymph node negative Adjuvant radiation therapy completed  08/20/2023 ----------------------------------------------------------------------------------------------------------------------------------------------- Current treatment: Herceptin  and Perjeta  maintenance until 12/20/2023 Toxicities: Fatigue and weakness: Gradually improving Chemo-induced leukopenia: Monitoring closely Fibroids: Causing heavy menstrual cycles: Slowly improving   Return to clinic every 3 weeks for Herceptin  and Perjeta  every 6 weeks and follow-up with me ------------------------------------- Assessment and Plan Assessment & Plan HER2-positive right breast cancer, status post radiation and ongoing immunotherapy Radiation therapy ongoing, immunotherapy ongoing. Estrogen receptor negative, no hormone blocker needed. Recurrence risk decreases over time. Hemoglobin improving, mild neutropenia but adequate neutrophils. - Immunotherapy ongoing as planned. - Schedule annual mammograms. - Perform Guardant Reveal blood test every six months. - Discuss breast lift and reduction with plastic surgeon.  Right breast pain and pruritus post-radiation Tenderness and itchiness managed with topical gel. Skin healed from radiation damage. - Continue topical gel as needed.  Mild neutropenia secondary to cancer  therapy Mild neutropenia persists, adequate neutrophils, white blood cell count improving.  Menorrhagia secondary to uterine fibroids Improvement in menstrual bleeding, suggesting stabilization or improvement in symptoms.  Gastrointestinal sensitivity to fried foods Intermittent gastrointestinal sensitivity after fried foods, leading to increased bowel movements. - Advise dietary modifications to avoid fried foods.  Flu vaccination Explained benefits of flu shot due to borderline immune status. - Administer flu shot.      No orders of the defined types were placed in this encounter.  The patient has a good understanding of the overall plan. she agrees with it. she will  call with any problems that may develop before the next visit here. Total time spent: 30 mins including face to face time and time spent for planning, charting and co-ordination of care   Viinay K Kyjuan Gause, MD 10/18/23

## 2023-11-07 ENCOUNTER — Other Ambulatory Visit: Payer: Self-pay | Admitting: Hematology and Oncology

## 2023-11-07 DIAGNOSIS — C50411 Malignant neoplasm of upper-outer quadrant of right female breast: Secondary | ICD-10-CM

## 2023-11-08 ENCOUNTER — Ambulatory Visit

## 2023-11-08 ENCOUNTER — Inpatient Hospital Stay

## 2023-11-08 VITALS — BP 142/99 | HR 90 | Temp 97.7°F | Resp 18 | Wt 292.2 lb

## 2023-11-08 DIAGNOSIS — C50411 Malignant neoplasm of upper-outer quadrant of right female breast: Secondary | ICD-10-CM | POA: Diagnosis not present

## 2023-11-08 MED ORDER — TRASTUZUMAB-ANNS CHEMO 150 MG IV SOLR
750.0000 mg | Freq: Once | INTRAVENOUS | Status: AC
  Administered 2023-11-08: 750 mg via INTRAVENOUS
  Filled 2023-11-08: qty 35.71

## 2023-11-08 MED ORDER — DIPHENHYDRAMINE HCL 25 MG PO CAPS
50.0000 mg | ORAL_CAPSULE | Freq: Once | ORAL | Status: AC
  Administered 2023-11-08: 50 mg via ORAL
  Filled 2023-11-08: qty 2

## 2023-11-08 MED ORDER — SODIUM CHLORIDE 0.9 % IV SOLN
420.0000 mg | Freq: Once | INTRAVENOUS | Status: AC
  Administered 2023-11-08: 420 mg via INTRAVENOUS
  Filled 2023-11-08: qty 14

## 2023-11-08 MED ORDER — SODIUM CHLORIDE 0.9 % IV SOLN: INTRAVENOUS | Status: DC

## 2023-11-08 MED ORDER — ACETAMINOPHEN 325 MG PO TABS
650.0000 mg | ORAL_TABLET | Freq: Once | ORAL | Status: AC
  Administered 2023-11-08: 650 mg via ORAL
  Filled 2023-11-08: qty 2

## 2023-11-08 NOTE — Patient Instructions (Signed)
 [facility] - A DEPT OF [facility]  Discharge Instructions: Thank you for choosing [facility] to provide your oncology and hematology care.   If you have a lab appointment with the Cancer Center, please go directly to the Cancer Center and check in at the registration area.   Wear comfortable clothing and clothing appropriate for easy access to any Portacath or PICC line.   We strive to give you quality time with your provider. You may need to reschedule your appointment if you arrive late (15 or more minutes).  Arriving late affects you and other patients whose appointments are after yours.  Also, if you miss three or more appointments without notifying the office, you may be dismissed from the clinic at the provider's discretion.      For prescription refill requests, have your pharmacy contact our office and allow 72 hours for refills to be completed.    Today you received the following chemotherapy and/or immunotherapy agents perjeta  and Kanjinti       To help prevent nausea and vomiting after your treatment, we encourage you to take your nausea medication as directed.  BELOW ARE SYMPTOMS THAT SHOULD BE REPORTED IMMEDIATELY: *FEVER GREATER THAN 100.4 F (38 C) OR HIGHER *CHILLS OR SWEATING *NAUSEA AND VOMITING THAT IS NOT CONTROLLED WITH YOUR NAUSEA MEDICATION *UNUSUAL SHORTNESS OF BREATH *UNUSUAL BRUISING OR BLEEDING *URINARY PROBLEMS (pain or burning when urinating, or frequent urination) *BOWEL PROBLEMS (unusual diarrhea, constipation, pain near the anus) TENDERNESS IN MOUTH AND THROAT WITH OR WITHOUT PRESENCE OF ULCERS (sore throat, sores in mouth, or a toothache) UNUSUAL RASH, SWELLING OR PAIN  UNUSUAL VAGINAL DISCHARGE OR ITCHING   Items with * indicate a potential emergency and should be followed up as soon as possible or go to the Emergency Department if any problems should occur.  Please show the CHEMOTHERAPY ALERT CARD or  IMMUNOTHERAPY ALERT CARD at check-in to the Emergency Department and triage nurse.  Should you have questions after your visit or need to cancel or reschedule your appointment, please contact [facility] - A DEPT OF [facility]  Dept: [phone]  and follow the prompts.  Office hours are 8:00 a.m. to 4:30 p.m. Monday - Friday. Please note that voicemails left after 4:00 p.m. may not be returned until the following business day.  We are closed weekends and major holidays. You have access to a nurse at all times for urgent questions. Please call the main number to the clinic Dept: [phone] and follow the prompts.   For any non-urgent questions, you may also contact your provider using MyChart. We now offer e-Visits for anyone 37 and older to request care online for non-urgent symptoms. For details visit [email or web address].   Also download the MyChart app! Go to the app store, search MyChart, open the app, select [place], and log in with your MyChart username and password.

## 2023-11-09 ENCOUNTER — Telehealth: Payer: Self-pay | Admitting: *Deleted

## 2023-11-09 NOTE — Telephone Encounter (Signed)
 Received call from pt wanting to confirm the medication she received during last visit.  RN reviewed, pt verbalized understanding.

## 2023-11-23 ENCOUNTER — Telehealth: Payer: Self-pay | Admitting: *Deleted

## 2023-11-23 ENCOUNTER — Ambulatory Visit: Payer: Self-pay | Admitting: Surgery

## 2023-11-23 NOTE — H&P (View-Only) (Signed)
 PROVIDER:  DEBBY CURTISTINE SHIPPER, MD  MRN: [id number] DOB: July 26, 1980 DATE OF ENCOUNTER: 11/23/2023 Subjective     Chief Complaint: Follow-up     History of Present Illness: Kara Webb is a 43 y.o. female who is seen today for long-term follow-up after treatment of right breast cancer with breast conserving surgery, radiation therapy and chemotherapy.  She will finish chemotherapy and wants her port removed.  Overall, she has done well with treatment..     Review of Systems: A complete review of systems was obtained from the patient.  I have reviewed this information and discussed as appropriate with the patient.  See HPI as well for other ROS.      Medical History: Past Medical History:  Diagnosis Date  . Anxiety   . History of cancer     Patient Active Problem List  Diagnosis  . Malignant neoplasm of upper-outer quadrant of right breast in female, estrogen receptor negative (CMS/HHS-HCC)    Past Surgical History:  Procedure Laterality Date  . SABRABreast Biopsy Right 12/18/2022  . Toe Surgery     2004     Allergies  Allergen Reactions  . Fosaprepitant  Dimeglumine Shortness Of Breath    Approximately 3 min into Emend infusion, pt started to c/o abdominal pain & shortness of breath. Facial flushing noted. Infusion stopped, 1L IVF initiated, 20mg  Pepcid  given. Pt states she feels much better. Emend infusion d/c. VSS throughout. See flowsheets, notes and [facility] 01/12/23.    Current Outpatient Medications on File Prior to Visit  Medication Sig Dispense Refill  . ALPRAZolam  (XANAX ) 0.5 MG tablet Take 0.5 mg by mouth    . ferrous gluconate  (FERGON) 324 MG tablet Take 324 mg by mouth    . lidocaine -prilocaine  (EMLA ) cream Apply to affected area once    . dexAMETHasone  (DECADRON ) 4 MG tablet Take 2 tabs by mouth 2 times daily starting day before chemo. Then take 2 tabs daily for 2 days starting day after chemo. Take with food.    . ondansetron  (ZOFRAN ) 8 MG tablet Take 8  mg by mouth    . venlafaxine  (EFFEXOR -XR) 37.5 MG XR capsule Take 37.5 mg by mouth     No current facility-administered medications on file prior to visit.    Family History  Problem Relation Age of Onset  . Hyperlipidemia (Elevated cholesterol) Mother      Social History   Tobacco Use  Smoking Status Never  Smokeless Tobacco Never     Social History   Socioeconomic History  . Marital status: Single  Tobacco Use  . Smoking status: Never  . Smokeless tobacco: Never  Vaping Use  . Vaping status: Never Used  Substance and Sexual Activity  . Alcohol use: Yes  . Drug use: Never   Social Drivers of Health   Food Insecurity: No Food Insecurity (06/28/2023)   Received from [facility]   Hunger Vital Sign   . Within the past 12 months, you worried that your food would run out before you got the money to buy more.: Never true   . Within the past 12 months, the food you bought just didn't last and you didn't have money to get more.: Never true  Transportation Needs: No Transportation Needs (12/27/2022)   Received from [facility] - Transportation   . Lack of Transportation (Medical): No   . Lack of Transportation (Non-Medical): No  Housing Stability: Unknown (05/07/2023)   Housing Stability Vital Sign   . Homeless in the  Last Year: No    Objective:    Vitals:   11/23/23 1130  PainSc: 0-No pain    There is no height or weight on file to calculate BMI.  Physical Exam Exam conducted with a chaperone present.  Cardiovascular:     Rate and Rhythm: Normal rate.  Pulmonary:     Effort: Pulmonary effort is normal.  Chest:  Breasts:    Left: Normal.       Comments: Right breast scar is noted.  Postradiation changes noted with minimal cosmetic defect. Skin:    General: Skin is warm.  Neurological:     General: No focal deficit present.     Mental Status: She is alert.            Assessment and Plan:     Diagnoses and all orders for this  visit:  Malignant neoplasm of upper-outer quadrant of right breast in female, estrogen receptor negative (CMS/HHS-HCC)  Port-A-Cath in place  Patient is finishing chemotherapy and desires port removal.  This gets scheduled.  Risks and benefits of port removal reviewed today.    No follow-ups on file.   DEBBY CURTISTINE SHIPPER, MD    I had direct face-to-face contact with the patient for a total of 12 minutes and greater than 50% of that time was spent providing counseling and/or coordination of care for the patient regarding port removal.

## 2023-11-23 NOTE — Telephone Encounter (Signed)
 Received call from pt requesting if she can proceed with port a cath removal prior to last immunotherapy tx.  Pt states she will be changing jobs and due to travel with work, she would prefer to have it out after her next treatment.  Pt states she spoke with Dr. Cornette today who is okay with the plan but wanted to ensure Dr. Odean was okay with it as well.  Per MD okay to proceed.  Pt states she will get scheduled for port removal and alert our office on date and time of procedure.

## 2023-11-29 ENCOUNTER — Other Ambulatory Visit

## 2023-11-29 ENCOUNTER — Inpatient Hospital Stay: Attending: Hematology and Oncology

## 2023-11-29 ENCOUNTER — Encounter (HOSPITAL_BASED_OUTPATIENT_CLINIC_OR_DEPARTMENT_OTHER): Payer: Self-pay | Admitting: Surgery

## 2023-11-29 ENCOUNTER — Other Ambulatory Visit: Payer: Self-pay

## 2023-11-29 ENCOUNTER — Ambulatory Visit

## 2023-11-29 ENCOUNTER — Inpatient Hospital Stay (HOSPITAL_BASED_OUTPATIENT_CLINIC_OR_DEPARTMENT_OTHER): Admitting: Hematology and Oncology

## 2023-11-29 ENCOUNTER — Inpatient Hospital Stay

## 2023-11-29 ENCOUNTER — Ambulatory Visit: Admitting: Hematology and Oncology

## 2023-11-29 ENCOUNTER — Encounter: Payer: Self-pay | Admitting: *Deleted

## 2023-11-29 VITALS — BP 134/80 | HR 90 | Temp 98.7°F | Resp 17 | Ht 66.0 in | Wt 294.4 lb

## 2023-11-29 DIAGNOSIS — Z1722 Progesterone receptor negative status: Secondary | ICD-10-CM | POA: Diagnosis not present

## 2023-11-29 DIAGNOSIS — Z5112 Encounter for antineoplastic immunotherapy: Secondary | ICD-10-CM | POA: Insufficient documentation

## 2023-11-29 DIAGNOSIS — Z1732 Human epidermal growth factor receptor 2 negative status: Secondary | ICD-10-CM | POA: Diagnosis not present

## 2023-11-29 DIAGNOSIS — Z923 Personal history of irradiation: Secondary | ICD-10-CM | POA: Insufficient documentation

## 2023-11-29 DIAGNOSIS — C50411 Malignant neoplasm of upper-outer quadrant of right female breast: Secondary | ICD-10-CM

## 2023-11-29 DIAGNOSIS — Z171 Estrogen receptor negative status [ER-]: Secondary | ICD-10-CM

## 2023-11-29 DIAGNOSIS — Z79899 Other long term (current) drug therapy: Secondary | ICD-10-CM | POA: Insufficient documentation

## 2023-11-29 DIAGNOSIS — Z9221 Personal history of antineoplastic chemotherapy: Secondary | ICD-10-CM | POA: Insufficient documentation

## 2023-11-29 LAB — CMP (CANCER CENTER ONLY)
ALT: 49 U/L — ABNORMAL HIGH (ref 0–44)
AST: 29 U/L (ref 15–41)
Albumin: 3.6 g/dL (ref 3.5–5.0)
Alkaline Phosphatase: 67 U/L (ref 38–126)
Anion gap: 5 (ref 5–15)
BUN: 11 mg/dL (ref 6–20)
CO2: 28 mmol/L (ref 22–32)
Calcium: 8.9 mg/dL (ref 8.9–10.3)
Chloride: 105 mmol/L (ref 98–111)
Creatinine: 0.85 mg/dL (ref 0.44–1.00)
GFR, Estimated: >60 mL/min (ref >60)
Glucose, Bld: 104 mg/dL — ABNORMAL HIGH (ref 70–99)
Potassium: 3.4 mmol/L — ABNORMAL LOW (ref 3.5–5.1)
Sodium: 138 mmol/L (ref 135–145)
Total Bilirubin: 0.4 mg/dL (ref 0.0–1.2)
Total Protein: 6.7 g/dL (ref 6.5–8.1)

## 2023-11-29 LAB — CBC WITH DIFFERENTIAL (CANCER CENTER ONLY)
Abs Immature Granulocytes: 0 K/uL (ref 0.00–0.07)
Basophils Absolute: 0 K/uL (ref 0.0–0.1)
Basophils Relative: 0 %
Eosinophils Absolute: 0.1 K/uL (ref 0.0–0.5)
Eosinophils Relative: 2 %
HCT: 36.1 % (ref 36.0–46.0)
Hemoglobin: 11.9 g/dL — ABNORMAL LOW (ref 12.0–15.0)
Immature Granulocytes: 0 %
Lymphocytes Relative: 25 %
Lymphs Abs: 0.7 K/uL (ref 0.7–4.0)
MCH: 26.8 pg (ref 26.0–34.0)
MCHC: 33 g/dL (ref 30.0–36.0)
MCV: 81.3 fL (ref 80.0–100.0)
Monocytes Absolute: 0.3 K/uL (ref 0.1–1.0)
Monocytes Relative: 11 %
Neutro Abs: 1.7 K/uL (ref 1.7–7.7)
Neutrophils Relative %: 62 %
Platelet Count: 145 K/uL — ABNORMAL LOW (ref 150–400)
RBC: 4.44 MIL/uL (ref 3.87–5.11)
RDW: 14.8 % (ref 11.5–15.5)
WBC Count: 2.8 K/uL — ABNORMAL LOW (ref 4.0–10.5)
nRBC: 0 % (ref 0.0–0.2)

## 2023-11-29 LAB — PREGNANCY, URINE: Preg Test, Ur: NEGATIVE

## 2023-11-29 MED ORDER — DIPHENHYDRAMINE HCL 25 MG PO CAPS
50.0000 mg | ORAL_CAPSULE | Freq: Once | ORAL | Status: AC
  Administered 2023-11-29: 50 mg via ORAL
  Filled 2023-11-29: qty 2

## 2023-11-29 MED ORDER — ACETAMINOPHEN 325 MG PO TABS
650.0000 mg | ORAL_TABLET | Freq: Once | ORAL | Status: AC
  Administered 2023-11-29: 650 mg via ORAL
  Filled 2023-11-29: qty 2

## 2023-11-29 MED ORDER — TRASTUZUMAB-ANNS CHEMO 150 MG IV SOLR
750.0000 mg | Freq: Once | INTRAVENOUS | Status: AC
  Administered 2023-11-29: 750 mg via INTRAVENOUS
  Filled 2023-11-29: qty 35.71

## 2023-11-29 MED ORDER — SODIUM CHLORIDE 0.9 % IV SOLN
420.0000 mg | Freq: Once | INTRAVENOUS | Status: AC
  Administered 2023-11-29: 420 mg via INTRAVENOUS
  Filled 2023-11-29: qty 14

## 2023-11-29 MED ORDER — SODIUM CHLORIDE 0.9 % IV SOLN: INTRAVENOUS | Status: DC

## 2023-11-29 NOTE — Progress Notes (Signed)
 Patient Care Team: Ilah Crigler, MD as PCP - General (Family Medicine) Tyree Nanetta SAILOR, RN as Oncology Nurse Navigator Shannon Agent, MD as Consulting Physician (Radiation Oncology) Odean Potts, MD as Consulting Physician (Hematology and Oncology) Vanderbilt Ned, MD as Consulting Physician (General Surgery)  DIAGNOSIS:  Encounter Diagnosis  Name Primary?   Malignant neoplasm of upper-outer quadrant of right breast in female, estrogen receptor negative (HCC) Yes    SUMMARY OF ONCOLOGIC HISTORY: Oncology History  Malignant neoplasm of upper-outer quadrant of right breast in female, estrogen receptor negative (HCC)  12/18/2022 Initial Diagnosis   Palpable right breast lump (irregular density) by ultrasound 1.3 cm at 9:30 position, ultrasound-guided biopsy: Grade 2 IDC no LVI, ER 0%, PR 0%, Ki67 30%, HER2 3+ positive, 1 axillary lymph node with cortical thickening biopsy: Positive   12/27/2022 Cancer Staging   Staging form: Breast, AJCC 8th Edition - Clinical: Stage IIA (cT1c, cN1, cM0, G2, ER-, PR-, HER2+) - Signed by Odean Potts, MD on 12/27/2022 Stage prefix: Initial diagnosis Histologic grading system: 3 grade system   01/11/2023 Genetic Testing   Negative [facility] CancerNext +RNAinsight Panel.  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 Report date is 01/11/2023.   The [facility] CancerNext+RNAinsight Panel includes sequencing, rearrangement analysis, and RNA analysis for the following 39 genes: APC, ATM, BAP1, BARD1, BMPR1A, BRCA1, BRCA2, BRIP1, CDH1, CDKN2A, CHEK2, FH, FLCN, MET, MLH1, MSH2, MSH6, MUTYH, NF1, NTHL1, PALB2, PMS2, PTEN, RAD51C, RAD51D, SMAD4, STK11, TP53, TSC1, TSC2, and VHL (sequencing and deletion/duplication); AXIN2, HOXB13, MBD4, MSH3, POLD1 and POLE (sequencing only); EPCAM and GREM1 (deletion/duplication only).    01/12/2023 -  Chemotherapy   Patient is on Treatment Plan : BREAST  Docetaxel  + Carboplatin  + Trastuzumab  + Pertuzumab   (TCHP) q21d      06/05/2023  Surgery   Right lumpectomy: Pathologic complete response   07/09/2023 - 08/20/2023 Radiation Therapy   Adjuvant XRT     CHIEF COMPLIANT: Herceptin  maintenance  HISTORY OF PRESENT ILLNESS:   History of Present Illness  Kara Webb is a 43 year old female with HER2 positive breast cancer who presents for follow-up after treatment.  She is nearing the completion of her treatment for HER2 positive breast cancer and lacks the estrogen receptor. She experiences brain fog, characterized by the need to act on thoughts immediately to avoid forgetting them. Congestion occurs after being in crowds, such as at church, without fever or cough, and over-the-counter medications like Sudafed have not provided relief.     ALLERGIES:  is allergic to emend [fosaprepitant  dimeglumine].  MEDICATIONS:  Current Outpatient Medications  Medication Sig Dispense Refill   ALPRAZolam  (XANAX ) 0.5 MG tablet Take 1 tablet (0.5 mg total) by mouth at bedtime as needed for anxiety or sleep. 20 tablet 3   amoxicillin (AMOXIL) 500 MG capsule Take 500 mg by mouth 2 (two) times daily.     ferrous gluconate  (FERGON) 324 MG tablet Take 1 tablet (324 mg total) by mouth 2 (two) times daily with a meal. 60 tablet 6   ibuprofen  (ADVIL ) 800 MG tablet Take 1 tablet (800 mg total) by mouth every 8 (eight) hours as needed. 30 tablet 0   IUD'S IU IUD's     lidocaine -prilocaine  (EMLA ) cream Apply to affected area once 30 g 3   ondansetron  (ZOFRAN ) 8 MG tablet Take 1 tablet (8 mg total) by mouth every 8 (eight) hours as needed for nausea or vomiting. Start on the third day after chemotherapy. 30 tablet 1   prochlorperazine  (COMPAZINE ) 10 MG  tablet Take 1 tablet (10 mg total) by mouth every 6 (six) hours as needed for nausea or vomiting. 30 tablet 1   No current facility-administered medications for this visit.    PHYSICAL EXAMINATION: ECOG PERFORMANCE STATUS: 1 - Symptomatic but completely ambulatory  Vitals:   11/29/23 0833  BP:  134/80  Pulse: 90  Resp: 17  Temp: 98.7 F (37.1 C)  SpO2: 99%   Filed Weights   11/29/23 0833  Weight: 294 lb 6.4 oz (133.5 kg)    Physical Exam   (exam performed in the presence of a chaperone)  LABORATORY DATA:  I have reviewed the data as listed    Latest Ref Rng & Units 10/18/2023    8:11 AM 09/06/2023    9:36 AM 07/26/2023    1:51 PM  CMP  Glucose 70 - 99 mg/dL 893  94  86   BUN 6 - 20 mg/dL 14  13  12    Creatinine 0.44 - 1.00 mg/dL 9.05  9.11  9.16   Sodium 135 - 145 mmol/L 138  137  138   Potassium 3.5 - 5.1 mmol/L 3.7  3.9  3.7   Chloride 98 - 111 mmol/L 107  107  106   CO2 22 - 32 mmol/L 27  28  29    Calcium 8.9 - 10.3 mg/dL 8.8  8.7  9.1   Total Protein 6.5 - 8.1 g/dL 7.1  6.7  7.2   Total Bilirubin 0.0 - 1.2 mg/dL 0.3  0.3  0.2   Alkaline Phos 38 - 126 U/L 54  71  88   AST 15 - 41 U/L 27  19  24    ALT 0 - 44 U/L 27  21  44     Lab Results  Component Value Date   WBC 2.7 (L) 10/18/2023   HGB 11.8 (L) 10/18/2023   HCT 36.1 10/18/2023   MCV 80.0 10/18/2023   PLT 146 (L) 10/18/2023   NEUTROABS 1.5 (L) 10/18/2023    ASSESSMENT & PLAN:  Malignant neoplasm of upper-outer quadrant of right breast in female, estrogen receptor negative (HCC) 12/18/2022:Palpable right breast lump (irregular density) by ultrasound 1.3 cm at 9:30 position, ultrasound-guided biopsy: Grade 2 IDC no LVI, ER 0%, PR 0%, Ki67 30%, HER2 3+ positive, 1 axillary lymph node with cortical thickening biopsy: Positive    Breast MRI 01/08/2023: 1.6 cm mass, 2.4 cm non-mass enhancement: Biopsy: DCIS   Treatment plan: Neoadjuvant chemotherapy with [facility] Perjeta  01/12/2023-04/26/2023 06/05/2023: Right lumpectomy: Negative for residual cancer, pathologic complete response 0/1 lymph node negative Adjuvant radiation therapy completed 08/20/2023 ----------------------------------------------------------------------------------------------------------------------------------------------- Current treatment:  Herceptin  and Perjeta  maintenance until 12/20/2023 Toxicities: Fatigue and weakness: Gradually improving Chemo-induced leukopenia: Monitoring closely Fibroids: Causing heavy menstrual cycles: Slowly improving  Breast cancer surveillance: Mammogram will be done in December 2025 Guardant reveal for MRD testing  She has 1 more Herceptin  treatment in December. She has appointment for the removal of the port.  The last treatment will have to be done through a peripheral IV. Follow-up in 6 months and then annually ------------------------------------- Assessment and Plan Assessment & Plan HER2-positive right breast cancer, upper-outer quadrant, female High cure rate with Herceptin . Estrogen receptor negative, no need for estrogen receptor therapy. - Administer last Herceptin  infusion on December 4th. - Schedule follow-up in six months. - Set up annual mammograms at breast center. - Provided brochure on Gardant blood test for circulating cancer cell DNA.  Cognitive symptoms (brain fog) post-cancer therapy Reports brain fog and memory  issues post-treatment. Interested in research study participation. - Associate professor team for cognitive symptoms study participation.  Congestion (likely allergic or viral) Congestion likely allergic or viral. Over-the-counter medications ineffective. - Recommend over-the-counter antihistamines such as Claritin or Allegra. - Advise wearing a mask in crowded places.      No orders of the defined types were placed in this encounter.  The patient has a good understanding of the overall plan. she agrees with it. she will call with any problems that may develop before the next visit here.  I personally spent a total of 30 minutes in the care of the patient today including preparing to see the patient, getting/reviewing separately obtained history, performing a medically appropriate exam/evaluation, counseling and educating, placing orders, referring and  communicating with other health care professionals, documenting clinical information in the EHR, independently interpreting results, communicating results, and coordinating care.   Viinay K Slayden Mennenga, MD 11/29/23

## 2023-11-29 NOTE — Assessment & Plan Note (Signed)
 12/18/2022:Palpable right breast lump (irregular density) by ultrasound 1.3 cm at 9:30 position, ultrasound-guided biopsy: Grade 2 IDC no LVI, ER 0%, PR 0%, Ki67 30%, HER2 3+ positive, 1 axillary lymph node with cortical thickening biopsy: Positive    Breast MRI 01/08/2023: 1.6 cm mass, 2.4 cm non-mass enhancement: Biopsy: DCIS   Treatment plan: Neoadjuvant chemotherapy with [facility] Perjeta  01/12/2023-04/26/2023 06/05/2023: Right lumpectomy: Negative for residual cancer, pathologic complete response 0/1 lymph node negative Adjuvant radiation therapy completed 08/20/2023 ----------------------------------------------------------------------------------------------------------------------------------------------- Current treatment: Herceptin  and Perjeta  maintenance until 12/20/2023 Toxicities: Fatigue and weakness: Gradually improving Chemo-induced leukopenia: Monitoring closely Fibroids: Causing heavy menstrual cycles: Slowly improving  Breast cancer surveillance: Mammogram will be done in December 2025 Guardant reveal for MRD testing  She has 1 more Herceptin  treatment in December. We will request the removal of the port. Follow-up in 6 months and then annually

## 2023-11-29 NOTE — Patient Instructions (Signed)
 [facility] - A DEPT OF [facility]  Discharge Instructions: Thank you for choosing [facility] to provide your oncology and hematology care.   If you have a lab appointment with the Cancer Center, please go directly to the Cancer Center and check in at the registration area.   Wear comfortable clothing and clothing appropriate for easy access to any Portacath or PICC line.   We strive to give you quality time with your provider. You may need to reschedule your appointment if you arrive late (15 or more minutes).  Arriving late affects you and other patients whose appointments are after yours.  Also, if you miss three or more appointments without notifying the office, you may be dismissed from the clinic at the provider's discretion.      For prescription refill requests, have your pharmacy contact our office and allow 72 hours for refills to be completed.    Today you received the following chemotherapy and/or immunotherapy agents perjeta  and Kanjinti       To help prevent nausea and vomiting after your treatment, we encourage you to take your nausea medication as directed.  BELOW ARE SYMPTOMS THAT SHOULD BE REPORTED IMMEDIATELY: *FEVER GREATER THAN 100.4 F (38 C) OR HIGHER *CHILLS OR SWEATING *NAUSEA AND VOMITING THAT IS NOT CONTROLLED WITH YOUR NAUSEA MEDICATION *UNUSUAL SHORTNESS OF BREATH *UNUSUAL BRUISING OR BLEEDING *URINARY PROBLEMS (pain or burning when urinating, or frequent urination) *BOWEL PROBLEMS (unusual diarrhea, constipation, pain near the anus) TENDERNESS IN MOUTH AND THROAT WITH OR WITHOUT PRESENCE OF ULCERS (sore throat, sores in mouth, or a toothache) UNUSUAL RASH, SWELLING OR PAIN  UNUSUAL VAGINAL DISCHARGE OR ITCHING   Items with * indicate a potential emergency and should be followed up as soon as possible or go to the Emergency Department if any problems should occur.  Please show the CHEMOTHERAPY ALERT CARD or  IMMUNOTHERAPY ALERT CARD at check-in to the Emergency Department and triage nurse.  Should you have questions after your visit or need to cancel or reschedule your appointment, please contact [facility] - A DEPT OF [facility]  Dept: [phone]  and follow the prompts.  Office hours are 8:00 a.m. to 4:30 p.m. Monday - Friday. Please note that voicemails left after 4:00 p.m. may not be returned until the following business day.  We are closed weekends and major holidays. You have access to a nurse at all times for urgent questions. Please call the main number to the clinic Dept: [phone] and follow the prompts.   For any non-urgent questions, you may also contact your provider using MyChart. We now offer e-Visits for anyone 37 and older to request care online for non-urgent symptoms. For details visit [email or web address].   Also download the MyChart app! Go to the app store, search MyChart, open the app, select [place], and log in with your MyChart username and password.

## 2023-11-29 NOTE — Progress Notes (Signed)
 Per MD request RN placed orders for guardant reveal testing through their portal.

## 2023-12-06 ENCOUNTER — Ambulatory Visit (HOSPITAL_BASED_OUTPATIENT_CLINIC_OR_DEPARTMENT_OTHER): Admitting: Anesthesiology

## 2023-12-06 ENCOUNTER — Ambulatory Visit (HOSPITAL_BASED_OUTPATIENT_CLINIC_OR_DEPARTMENT_OTHER)
Admission: RE | Admit: 2023-12-06 | Discharge: 2023-12-06 | Disposition: A | Discharge status: Home / Self Care | Attending: Surgery | Admitting: Surgery

## 2023-12-06 ENCOUNTER — Encounter (HOSPITAL_BASED_OUTPATIENT_CLINIC_OR_DEPARTMENT_OTHER): Payer: Self-pay | Admitting: Surgery

## 2023-12-06 ENCOUNTER — Other Ambulatory Visit: Payer: Self-pay

## 2023-12-06 ENCOUNTER — Encounter (HOSPITAL_BASED_OUTPATIENT_CLINIC_OR_DEPARTMENT_OTHER)
Admission: RE | Disposition: A | Discharge status: Home / Self Care | Payer: Self-pay | Source: Home / Self Care | Attending: Surgery

## 2023-12-06 DIAGNOSIS — Z01818 Encounter for other preprocedural examination: Secondary | ICD-10-CM

## 2023-12-06 DIAGNOSIS — Z9221 Personal history of antineoplastic chemotherapy: Secondary | ICD-10-CM | POA: Diagnosis not present

## 2023-12-06 DIAGNOSIS — E66813 Obesity, class 3: Secondary | ICD-10-CM | POA: Insufficient documentation

## 2023-12-06 DIAGNOSIS — Z923 Personal history of irradiation: Secondary | ICD-10-CM | POA: Insufficient documentation

## 2023-12-06 DIAGNOSIS — Z452 Encounter for adjustment and management of vascular access device: Secondary | ICD-10-CM | POA: Diagnosis present

## 2023-12-06 DIAGNOSIS — Z853 Personal history of malignant neoplasm of breast: Secondary | ICD-10-CM | POA: Diagnosis not present

## 2023-12-06 DIAGNOSIS — Z6841 Body Mass Index (BMI) 40.0 and over, adult: Secondary | ICD-10-CM | POA: Diagnosis not present

## 2023-12-06 DIAGNOSIS — D649 Anemia, unspecified: Secondary | ICD-10-CM | POA: Diagnosis not present

## 2023-12-06 HISTORY — PX: PORT-A-CATH REMOVAL: SHX5289

## 2023-12-06 LAB — POCT PREGNANCY, URINE: Preg Test, Ur: NEGATIVE

## 2023-12-06 SURGERY — REMOVAL PORT-A-CATH
Anesthesia: Monitor Anesthesia Care | Site: Chest | Laterality: Right

## 2023-12-06 MED ORDER — CEFAZOLIN SODIUM-DEXTROSE 3-4 GM/150ML-% IV SOLN
3.0000 g | INTRAVENOUS | Status: AC
  Administered 2023-12-06: 2 g via INTRAVENOUS

## 2023-12-06 MED ORDER — CHLORHEXIDINE GLUCONATE CLOTH 2 % EX PADS: 6.0000 | MEDICATED_PAD | Freq: Once | CUTANEOUS | Status: DC

## 2023-12-06 MED ORDER — FENTANYL CITRATE (PF) 100 MCG/2ML IJ SOLN
INTRAMUSCULAR | Status: AC
  Filled 2023-12-06: qty 2

## 2023-12-06 MED ORDER — ACETAMINOPHEN 500 MG PO TABS
ORAL_TABLET | ORAL | Status: AC
  Filled 2023-12-06: qty 2

## 2023-12-06 MED ORDER — ONDANSETRON HCL 4 MG/2ML IJ SOLN
INTRAMUSCULAR | Status: AC
  Filled 2023-12-06: qty 4

## 2023-12-06 MED ORDER — DEXAMETHASONE SOD PHOSPHATE PF 10 MG/ML IJ SOLN
INTRAMUSCULAR | Status: DC | PRN
  Administered 2023-12-06: 4 mg via INTRAVENOUS

## 2023-12-06 MED ORDER — PROPOFOL 500 MG/50ML IV EMUL
INTRAVENOUS | Status: DC | PRN
  Administered 2023-12-06: 100 ug/kg/min via INTRAVENOUS

## 2023-12-06 MED ORDER — FENTANYL CITRATE (PF) 100 MCG/2ML IJ SOLN
INTRAMUSCULAR | Status: DC | PRN
  Administered 2023-12-06: 50 ug via INTRAVENOUS

## 2023-12-06 MED ORDER — ONDANSETRON HCL 4 MG/2ML IJ SOLN
INTRAMUSCULAR | Status: DC | PRN
  Administered 2023-12-06: 4 mg via INTRAVENOUS

## 2023-12-06 MED ORDER — FENTANYL CITRATE (PF) 100 MCG/2ML IJ SOLN: 25.0000 ug | INTRAMUSCULAR | Status: DC | PRN

## 2023-12-06 MED ORDER — MIDAZOLAM HCL 2 MG/2ML IJ SOLN
INTRAMUSCULAR | Status: AC
  Filled 2023-12-06: qty 2

## 2023-12-06 MED ORDER — AMISULPRIDE (ANTIEMETIC) 5 MG/2ML IV SOLN: 10.0000 mg | Freq: Once | INTRAVENOUS | Status: DC | PRN

## 2023-12-06 MED ORDER — BUPIVACAINE-EPINEPHRINE 0.25% -1:200000 IJ SOLN
INTRAMUSCULAR | Status: DC | PRN
Start: 2023-12-06 — End: 2023-12-06
  Administered 2023-12-06: 10 mL

## 2023-12-06 MED ORDER — LIDOCAINE 2% (20 MG/ML) 5 ML SYRINGE
INTRAMUSCULAR | Status: DC | PRN
  Administered 2023-12-06: 100 mg via INTRAVENOUS

## 2023-12-06 MED ORDER — MIDAZOLAM HCL (PF) 2 MG/2ML IJ SOLN
INTRAMUSCULAR | Status: DC | PRN
  Administered 2023-12-06: 2 mg via INTRAVENOUS

## 2023-12-06 MED ORDER — CEFAZOLIN SODIUM-DEXTROSE 3-4 GM/150ML-% IV SOLN
INTRAVENOUS | Status: AC
  Filled 2023-12-06: qty 150

## 2023-12-06 MED ORDER — LIDOCAINE 2% (20 MG/ML) 5 ML SYRINGE
INTRAMUSCULAR | Status: AC
  Filled 2023-12-06: qty 5

## 2023-12-06 MED ORDER — LACTATED RINGERS IV SOLN: INTRAVENOUS | Status: DC

## 2023-12-06 MED ORDER — ACETAMINOPHEN 500 MG PO TABS
1000.0000 mg | ORAL_TABLET | ORAL | Status: AC
  Administered 2023-12-06: 1000 mg via ORAL

## 2023-12-06 MED ORDER — ONDANSETRON HCL 4 MG/2ML IJ SOLN: 4.0000 mg | Freq: Once | INTRAMUSCULAR | Status: DC | PRN

## 2023-12-06 SURGICAL SUPPLY — 26 items
BENZOIN TINCTURE PRP APPL 2/3 (GAUZE/BANDAGES/DRESSINGS) IMPLANT
BLADE SURG 15 STRL LF DISP TIS (BLADE) ×2 IMPLANT
CHLORAPREP W/TINT 26 (MISCELLANEOUS) ×2 IMPLANT
COVER BACK TABLE 60X90IN (DRAPES) ×2 IMPLANT
COVER MAYO STAND STRL (DRAPES) ×2 IMPLANT
DERMABOND ADVANCED .7 DNX12 (GAUZE/BANDAGES/DRESSINGS) ×2 IMPLANT
DRAPE LAPAROTOMY 100X72 PEDS (DRAPES) ×2 IMPLANT
DRAPE UTILITY XL STRL (DRAPES) ×2 IMPLANT
ELECTRODE REM PT RTRN 9FT ADLT (ELECTROSURGICAL) ×2 IMPLANT
GLOVE BIOGEL PI IND STRL 8 (GLOVE) ×2 IMPLANT
GLOVE ECLIPSE 8.0 STRL XLNG CF (GLOVE) ×2 IMPLANT
GOWN STRL REUS W/ TWL LRG LVL3 (GOWN DISPOSABLE) ×4 IMPLANT
GOWN STRL REUS W/ TWL XL LVL3 (GOWN DISPOSABLE) ×2 IMPLANT
NDL HYPO 25X1 1.5 SAFETY (NEEDLE) ×2 IMPLANT
NEEDLE HYPO 25X1 1.5 SAFETY (NEEDLE) ×1 IMPLANT
PACK BASIN DAY SURGERY FS (CUSTOM PROCEDURE TRAY) ×2 IMPLANT
PENCIL SMOKE EVACUATOR (MISCELLANEOUS) IMPLANT
SLEEVE SCD COMPRESS KNEE MED (STOCKING) IMPLANT
SOLN 0.9% NACL POUR BTL 1000ML (IV SOLUTION) ×2 IMPLANT
SPIKE FLUID TRANSFER (MISCELLANEOUS) ×2 IMPLANT
SPONGE T-LAP 4X18 ~~LOC~~+RFID (SPONGE) ×2 IMPLANT
STRIP CLOSURE SKIN 1/2X4 (GAUZE/BANDAGES/DRESSINGS) IMPLANT
SUT MON AB 4-0 PC3 18 (SUTURE) ×2 IMPLANT
SUT VICRYL 3-0 CR8 SH (SUTURE) ×2 IMPLANT
SYR CONTROL 10ML LL (SYRINGE) ×2 IMPLANT
TOWEL GREEN STERILE FF (TOWEL DISPOSABLE) ×2 IMPLANT

## 2023-12-06 NOTE — Transfer of Care (Signed)
 Immediate Anesthesia Transfer of Care Note  Patient: Kara Webb  Procedure(s) Performed: REMOVAL PORT-A-CATH (Right: Chest)  Patient Location: PACU  Anesthesia Type:General  Level of Consciousness: awake and alert   Airway & Oxygen Therapy: Patient Spontanous Breathing  Post-op Assessment: Report given to RN and Post -op Vital signs reviewed and stable  Post vital signs: Reviewed and stable  Last Vitals:  Vitals Value Taken Time  BP 135/95 12/06/23 15:17  Temp 36.4 C 12/06/23 15:17  Pulse 86 12/06/23 15:22  Resp 20 12/06/23 15:22  SpO2 99 % 12/06/23 15:22  Vitals shown include unfiled device data.  Last Pain:  Vitals:   12/06/23 1344  TempSrc: Temporal  PainSc: 0-No pain         Complications: No notable events documented.

## 2023-12-06 NOTE — Discharge Instructions (Addendum)
 #######################################################  GENERAL SURGERY: POST OP INSTRUCTIONS  ######################################################################  EAT Gradually transition to a high fiber diet with a fiber supplement over the next few weeks after discharge.  Start with a pureed / full liquid diet (see below)  WALK Walk an hour a day.  Control your pain to do that.    CONTROL PAIN Control pain so that you can walk, sleep, tolerate sneezing/coughing, go up/down stairs.  HAVE A BOWEL MOVEMENT DAILY Keep your bowels regular to avoid problems.  OK to try a laxative to override constipation.  OK to use an antidairrheal to slow down diarrhea.  Call if not better after 2 tries  CALL IF YOU HAVE PROBLEMS/CONCERNS Call if you are still struggling despite following these instructions. Call if you have concerns not answered by these instructions  ######################################################################    DIET: Follow a light bland diet & liquids the first 24 hours after arrival home, such as soup, liquids, starches, etc.  Be sure to drink plenty of fluids.  Quickly advance to a usual solid diet within a few days.  Avoid fast food or heavy meals as your are more likely to get nauseated or have irregular bowels.  A low-fat, high-fiber diet for the rest of your life is ideal.    Take your usually prescribed home medications unless otherwise directed. Blood thinners:  You can restart any strong blood thinners after the second postoperative day  for example: COUMADIN (warfarin), XERELTO (rivaroxaban), ELIQUIS (apixaban), PLAVIX (clopidigrel), BRILINTA (ticagrelor), EFFIENT (prasugrel), PRADAXA (dabigatran), etc  Continue aspirin before & after surgery..     Some oozing/bleeding the first 1-2 weeks is common but should taper down & be small volume.    If you are passing many large clots or having uncontrolling bleeding, call your surgeon  PAIN CONTROL: Pain  is best controlled by a usual combination of three different methods TOGETHER: Ice/Heat Over the counter pain medication Prescription pain medication Most patients will experience some swelling and bruising around the incisions.  Ice packs or heating pads (30-60 minutes up to 6 times a day) will help. Use ice for the first few days to help decrease swelling and bruising, then switch to heat to help relax tight/sore spots and speed recovery.  Some people prefer to use ice alone, heat alone, alternating between ice & heat.  Experiment to what works for you.  Swelling and bruising can take several weeks to resolve.   It is helpful to take an over-the-counter pain medication regularly for the first few weeks.  Choose one of the following that works best for you: Naproxen  (Aleve , etc)  Two 220mg  tabs twice a day Ibuprofen  (Advil , etc) Three 200mg  tabs four times a day (every meal & bedtime) Acetaminophen  (Tylenol , etc) 500-650mg  four times a day (every meal & bedtime) A  prescription for pain medication (such as oxycodone , hydrocodone, etc) should be given to you upon discharge.  Take your pain medication as prescribed.  If you are having problems/concerns with the prescription medicine (does not control pain, nausea, vomiting, rash, itching, etc), please call us  [phone] to see if we need to switch you to a different pain medicine that will work better for you and/or control your side effect better. If you need a refill on your pain medication, please contact your pharmacy.  They will contact our office to request authorization. Prescriptions will not be filled after 5 pm or on week-ends.  Avoid getting constipated.  Between the surgery and the pain medications, it is  common to experience some constipation.  Increasing fluid intake and taking a fiber supplement (such as Metamucil, Citrucel, FiberCon, MiraLax, etc) 1-2 times a day regularly will usually help prevent this problem from occurring.  A mild  laxative (prune juice, Milk of Magnesia, MiraLax, etc) should be taken according to package directions if there are no bowel movements after 48 hours.   Watch out for diarrhea.  If you have many loose bowel movements, simplify your diet to bland foods & liquids for a few days.  Stop any stool softeners and decrease your fiber supplement.  Switching to mild anti-diarrheal medications (Loperamide/Imodium, Kayopectate, Pepto Bismol) can help.  If this worsens or does not improve, please call us .  Wash / shower every day.  You may shower over the dressings as they are waterproof.  Continue to shower over incision(s) after the dressing is off. Remove your waterproof bandages 5 days after surgery.  You may leave the incision open to air.  You may have skin tapes (Steri Strips) covering the incision(s).  Leave them on until one week, then remove.  You may replace a dressing/Band-Aid to cover the incision for comfort if you wish.   ACTIVITIES as tolerated:   You may resume regular (light) daily activities beginning the next day--such as daily self-care, walking, climbing stairs--gradually increasing activities as tolerated.  If you can walk 30 minutes without difficulty, it is safe to try more intense activity such as jogging, treadmill, bicycling, low-impact aerobics, swimming, etc. Save the most intensive and strenuous activity for last such as sit-ups, heavy lifting, contact sports, etc  Refrain from any heavy lifting or straining until you are off narcotics for pain control.   DO NOT PUSH THROUGH PAIN.  Let pain be your guide: If it hurts to do something, don't do it.  Pain is your body warning you to avoid that activity for another week until the pain goes down. You may drive when you are no longer taking prescription pain medication, you can comfortably wear a seatbelt, and you can safely maneuver your car and apply brakes. You may have sexual intercourse when it is comfortable.   FOLLOW UP in our  office Please call [facility] at [phone] to set up an appointment to see your surgeon in the office for a follow-up appointment approximately 2-3 weeks after your surgery. Make sure that you call for this appointment the day you arrive home to insure a convenient appointment time.  9. IF YOU HAVE DISABILITY OR FAMILY LEAVE FORMS, BRING THEM TO THE OFFICE FOR PROCESSING.  DO NOT GIVE THEM TO YOUR DOCTOR.   WHEN TO CALL US  [phone]: Poor pain control Reactions / problems with new medications (rash/itching, nausea, etc)  Fever over 101.5 F (38.5 C) Worsening swelling or bruising Continued bleeding from incision. Increased pain, redness, or drainage from the incision Difficulty breathing / swallowing   The clinic staff is available to answer your questions during regular business hours (8:30am-5pm).  Please don't hesitate to call and ask to speak to one of our nurses for clinical concerns.   If you have a medical emergency, go to the nearest emergency room or call 911.  A surgeon from [facility] is always on call at the [facility], [place] [address], [place], [place]  [address] ? MAIN: [phone] ? TOLL FREE: [phone] ?  FAX [phone] [email or web address]  #######################################################    Post Anesthesia Home Care Instructions  Activity:  Get plenty of rest for the remainder of the day. A responsible individual must stay with you for 24 hours following the procedure.  For the next 24 hours, DO NOT: -Drive a car -Advertising copywriter -Drink alcoholic beverages -Take any medication unless instructed by your physician -Make any legal decisions or sign important papers.  Meals: Start with liquid foods such as gelatin or soup. Progress to regular foods as tolerated. Avoid greasy, spicy, heavy foods. If nausea and/or vomiting occur, drink only clear liquids until the nausea  and/or vomiting subsides. Call your physician if vomiting continues.  Special Instructions/Symptoms: Your throat may feel dry or sore from the anesthesia or the breathing tube placed in your throat during surgery. If this causes discomfort, gargle with warm salt water. The discomfort should disappear within 24 hours.  If you had a scopolamine patch placed behind your ear for the management of post- operative nausea and/or vomiting:  1. The medication in the patch is effective for 72 hours, after which it should be removed.  Wrap patch in a tissue and discard in the trash. Wash hands thoroughly with soap and water. 2. You may remove the patch earlier than 72 hours if you experience unpleasant side effects which may include dry mouth, dizziness or visual disturbances. 3. Avoid touching the patch. Wash your hands with soap and water after contact with the patch.     No tylenol  until after 745pm

## 2023-12-06 NOTE — Anesthesia Preprocedure Evaluation (Addendum)
 Anesthesia Evaluation  Patient identified by MRN, date of birth, ID band Patient awake    Reviewed: Allergy & Precautions, NPO status , Patient's Chart, lab work & pertinent test results  History of Anesthesia Complications Negative for: history of anesthetic complications  Airway Mallampati: II  TM Distance: >3 FB Neck ROM: Full    Dental  (+) Teeth Intact, Dental Advisory Given   Pulmonary neg pulmonary ROS   Pulmonary exam normal breath sounds clear to auscultation       Cardiovascular Exercise Tolerance: Good negative cardio ROS Normal cardiovascular exam Rhythm:Regular Rate:Normal     Neuro/Psych negative neurological ROS  negative psych ROS   GI/Hepatic negative GI ROS, Neg liver ROS,,,  Endo/Other    Class 3 obesity  Renal/GU negative Renal ROS     Musculoskeletal negative musculoskeletal ROS (+)    Abdominal   Peds  Hematology  (+) Blood dyscrasia (Thrombocytopenia), anemia   Anesthesia Other Findings Day of surgery medications reviewed with the patient.  H/o breast cancer   Reproductive/Obstetrics                              Anesthesia Physical Anesthesia Plan  ASA: 3  Anesthesia Plan: MAC   Post-op Pain Management: Tylenol  PO (pre-op)*   Induction: Intravenous  PONV Risk Score and Plan: 2 and TIVA, Treatment may vary due to age or medical condition, Midazolam , Dexamethasone  and Ondansetron   Airway Management Planned: Natural Airway and Simple Face Mask  Additional Equipment:   Intra-op Plan:   Post-operative Plan:   Informed Consent: I have reviewed the patients History and Physical, chart, labs and discussed the procedure including the risks, benefits and alternatives for the proposed anesthesia with the patient or authorized representative who has indicated his/her understanding and acceptance.     Dental advisory given  Plan Discussed with:  CRNA  Anesthesia Plan Comments:          Anesthesia Quick Evaluation

## 2023-12-06 NOTE — Interval H&P Note (Signed)
 History and Physical Interval Note:  12/06/2023 2:09 PM  Kara Webb  has presented today for surgery, with the diagnosis of PORT IN PLACE.  The various methods of treatment have been discussed with the patient and family. After consideration of risks, benefits and other options for treatment, the patient has consented to  Procedure(s): REMOVAL PORT-A-CATH (N/A) as a surgical intervention.  The patient's history has been reviewed, patient examined, no change in status, stable for surgery.  I have reviewed the patient's chart and labs.  Questions were answered to the patient's satisfaction.     Lihanna Biever A Merle Cirelli

## 2023-12-06 NOTE — Op Note (Signed)
Preop diagnosis: Indwelling port a catheter for chemotherapy  Postop diagnosis: Same  Procedure: Removal of port a catheter  Surgeon: Kenzel Ruesch M.D.  Anesthesia: MAC with local  EBL: Minimal  Specimen none  Drains: None  Indications for procedure: The patient presents for removal of port a catheter after completing chemotherapy. The patient no longer requires central venous access. Risks of bleeding, infection, catheter fragmentation, embolization, arrhythmias and damage to arteries, veins and nerves and possibly other mediastinal structures discussed. The patient agrees to proceed.  Description of procedure: The patient was seen in the holding area. Questions were answered. The patient agreed to proceed. The patient was taken to the operating room. The patient was placed supine. Anesthesia was initiated. The skin on the upper chest was prepped and draped in a sterile fashion. Timeout was done. The patient received preoperative antibiotics. Incision was made through the old port site and the hub of the Port-A-Cath was seen. The sutures were cut to release the port from the chest wall. The catheter was removed in its entirety without difficulty. The tract was closed with 3-0 Vicryl. 4 Monocryl was used to close the skin. All final counts were correct. The patient was taken to recovery in satisfactory condition.  

## 2023-12-06 NOTE — Anesthesia Postprocedure Evaluation (Signed)
 Anesthesia Post Note  Patient: Kara Webb  Procedure(s) Performed: REMOVAL PORT-A-CATH (Right: Chest)     Patient location during evaluation: PACU Anesthesia Type: MAC Level of consciousness: awake and alert Pain management: pain level controlled Vital Signs Assessment: post-procedure vital signs reviewed and stable Respiratory status: spontaneous breathing, nonlabored ventilation and respiratory function stable Cardiovascular status: stable and blood pressure returned to baseline Postop Assessment: no apparent nausea or vomiting Anesthetic complications: no   No notable events documented.  Last Vitals:  Vitals:   12/06/23 1517 12/06/23 1530  BP: (!) 135/95 (!) 138/100  Pulse: 83 84  Resp: 19 16  Temp: (!) 36.4 C   SpO2: 100% 100%    Last Pain:  Vitals:   12/06/23 1517  TempSrc:   PainSc: 0-No pain                 Garnette FORBES Skillern

## 2023-12-07 ENCOUNTER — Encounter (HOSPITAL_BASED_OUTPATIENT_CLINIC_OR_DEPARTMENT_OTHER): Payer: Self-pay | Admitting: Surgery

## 2023-12-10 ENCOUNTER — Other Ambulatory Visit: Payer: Self-pay | Admitting: *Deleted

## 2023-12-10 ENCOUNTER — Ambulatory Visit: Attending: Hematology and Oncology

## 2023-12-10 VITALS — Wt 293.4 lb

## 2023-12-10 DIAGNOSIS — Z483 Aftercare following surgery for neoplasm: Secondary | ICD-10-CM | POA: Insufficient documentation

## 2023-12-10 DIAGNOSIS — C50411 Malignant neoplasm of upper-outer quadrant of right female breast: Secondary | ICD-10-CM

## 2023-12-10 DIAGNOSIS — I890 Lymphedema, not elsewhere classified: Secondary | ICD-10-CM

## 2023-12-10 NOTE — Therapy (Signed)
 OUTPATIENT PHYSICAL THERAPY SOZO SCREENING NOTE   Patient Name: Kara Webb MRN: [id number] DOB:1980-11-30, 43 y.o., female Today's Date: 12/10/2023  PCP: Ilah Crigler, MD REFERRING PROVIDER: Odean Potts, MD   PT End of Session - 12/10/23 1015     Visit Number 2   # unchanged due to screen only   PT Start Time 1013    PT Stop Time 1027    PT Time Calculation (min) 14 min    Activity Tolerance Patient tolerated treatment well    Behavior During Therapy [facility] for tasks assessed/performed          Past Medical History:  Diagnosis Date   Breast cancer (HCC)    BV (bacterial vaginosis)    Cold intolerance 01/16/2005   Fatigue 01/16/2005   H/O chlamydia infection 08/12/2010   H/O urinary frequency 11/16/2000   H/O vaginal discharge 11/17/2003   H/O varicella    Heart murmur    Born with murmur   History of radiation therapy    Right breast-07/05/23-08/20/23- Dr. Lynwood Nasuti   HSV-1 infection    Hx: UTI (urinary tract infection)    Pelvic pain    Vulvovaginal candidiasis 04/16/2005   Yeast infection    Past Surgical History:  Procedure Laterality Date   BREAST BIOPSY Right 12/18/2022   US  RT BREAST BX W LOC DEV 1ST LESION IMG BX SPEC US  GUIDE 12/18/2022 [facility]   BREAST BIOPSY  06/01/2023   US  RT RADIOACTIVE SEED LOC 06/01/2023 [facility]   BREAST BIOPSY  06/01/2023   MM RT RADIOACTIVE SEED LOC MAMMO GUIDE 06/01/2023 [facility]   BREAST BIOPSY  06/01/2023   MM RT RADIOACTIVE SEED EA ADD LESION LOC MAMMO GUIDE 06/01/2023 [facility]   BREAST LUMPECTOMY WITH RADIOACTIVE SEED AND SENTINEL LYMPH NODE BIOPSY Right 06/05/2023   Procedure: RIGHT BREAST SEED LUMPECTOMY x2, RIGHT SEED TARGETED LYMPH NODE BIOPSY, RIGHT SENTINEL LYMPH NODE MAPPING;  Surgeon: Vanderbilt Ned, MD;  Location: [facility];  Service: General;  Laterality: Right;  RIGHT BREAST SEED LUMPECTOMY x2, RIGHT SEED TARGETED LYMPH NODE BIOPSY, RIGHT SENTINEL LYMPH NODE  MAPPING 90 MIN   IR CV LINE INJECTION  04/13/2023   PORT-A-CATH REMOVAL Right 12/06/2023   Procedure: REMOVAL PORT-A-CATH;  Surgeon: Vanderbilt Ned, MD;  Location: [facility];  Service: General;  Laterality: Right;   PORTACATH PLACEMENT N/A 01/03/2023   Procedure: INSERTION PORT-A-CATH WITH ULTRASOUND ANDREA;  Surgeon: Vanderbilt Ned, MD;  Location: [facility];  Service: General;  Laterality: N/A;   TOE SURGERY  2004   WISDOM TOOTH EXTRACTION  2003   Patient Active Problem List   Diagnosis Date Noted   Port-A-Cath in place 01/12/2023   Genetic testing 01/12/2023   Family history of breast cancer 12/27/2022   Family history of colon cancer 12/27/2022   Malignant neoplasm of upper-outer quadrant of right breast in female, estrogen receptor negative (HCC) 12/25/2022    REFERRING DIAG: right breast cancer at risk for lymphedema  THERAPY DIAG: Aftercare following surgery for neoplasm  PERTINENT HISTORY: Patient was diagnosed on 12/12/2022 with right grade 2 invasive ductal carcinoma breast cancer. It measures 1.3 cm and is located in the upper outer quadrant. It is ER/PR negative and HER2 positive with a Ki67 of 30%. She has a positive axillary lymph node. Completed neoadjuvant chemo. Underwent R breast lumpectomy and SLNB 0/1 on 06/05/23.  PRECAUTIONS: right UE Lymphedema risk, None  SUBJECTIVE: Pt returns for her 3 month L-Dex screen. My Rt  breast still feels heavy from radiation.   PAIN:  Are you having pain? No   SOZO SCREENING: Patient was assessed today using the SOZO machine to determine the lymphedema index score. This was compared to her baseline score. It was determined that she is within the recommended range when compared to her baseline and no further action is needed at this time. She will continue SOZO screenings. These are done every 3 months for 2 years post operatively followed by every 6 months for 2 years, and then  annually.  Patient reported a change in status to PTA which initiated the PTA consulting with a PT. Eward Sharps, PT assessed pt and determined it would be appropriate to initiate therapy at this time. PT requested a referral from patient's provider.    L-DEX FLOWSHEETS - 12/10/23 1000       L-DEX LYMPHEDEMA SCREENING   Measurement Type Unilateral    L-DEX MEASUREMENT EXTREMITY Upper Extremity    POSITION  Standing    DOMINANT SIDE Right    At Risk Side Right    BASELINE SCORE (UNILATERAL) -9.5          P: Evaluate Rt breast and cont every 3 month L-Dex screens until 05/2025, then transition to 6 months L-Dex screens until 05/2027.   Aden Berwyn Caldron, PTA 12/10/2023, 11:12 AM

## 2023-12-12 ENCOUNTER — Other Ambulatory Visit: Payer: Self-pay

## 2023-12-18 NOTE — Therapy (Signed)
 OUTPATIENT PHYSICAL THERAPY  UPPER EXTREMITY ONCOLOGY EVALUATION  Patient Name: Kara Webb MRN: [id number] DOB:June 23, 1980, 43 y.o., female Today's Date: 12/19/2023  END OF SESSION:  PT End of Session - 12/19/23 1006     Visit Number 1    Number of Visits 12    Date for Recertification  01/30/24    PT Start Time 1007    PT Stop Time 1053    PT Time Calculation (min) 46 min    Activity Tolerance Patient tolerated treatment well    Behavior During Therapy [facility] for tasks assessed/performed          Past Medical History:  Diagnosis Date   Breast cancer (HCC)    BV (bacterial vaginosis)    Cold intolerance 01/16/2005   Fatigue 01/16/2005   H/O chlamydia infection 08/12/2010   H/O urinary frequency 11/16/2000   H/O vaginal discharge 11/17/2003   H/O varicella    Heart murmur    Born with murmur   History of radiation therapy    Right breast-07/05/23-08/20/23- Dr. Lynwood Nasuti   HSV-1 infection    Hx: UTI (urinary tract infection)    Pelvic pain    Vulvovaginal candidiasis 04/16/2005   Yeast infection    Past Surgical History:  Procedure Laterality Date   BREAST BIOPSY Right 12/18/2022   US  RT BREAST BX W LOC DEV 1ST LESION IMG BX SPEC US  GUIDE 12/18/2022 [facility]   BREAST BIOPSY  06/01/2023   US  RT RADIOACTIVE SEED LOC 06/01/2023 [facility]   BREAST BIOPSY  06/01/2023   MM RT RADIOACTIVE SEED LOC MAMMO GUIDE 06/01/2023 [facility]   BREAST BIOPSY  06/01/2023   MM RT RADIOACTIVE SEED EA ADD LESION LOC MAMMO GUIDE 06/01/2023 [facility]   BREAST LUMPECTOMY WITH RADIOACTIVE SEED AND SENTINEL LYMPH NODE BIOPSY Right 06/05/2023   Procedure: RIGHT BREAST SEED LUMPECTOMY x2, RIGHT SEED TARGETED LYMPH NODE BIOPSY, RIGHT SENTINEL LYMPH NODE MAPPING;  Surgeon: Vanderbilt Ned, MD;  Location: [facility];  Service: General;  Laterality: Right;  RIGHT BREAST SEED LUMPECTOMY x2, RIGHT SEED TARGETED LYMPH NODE BIOPSY, RIGHT SENTINEL LYMPH NODE  MAPPING 90 MIN   IR CV LINE INJECTION  04/13/2023   PORT-A-CATH REMOVAL Right 12/06/2023   Procedure: REMOVAL PORT-A-CATH;  Surgeon: Vanderbilt Ned, MD;  Location: [facility];  Service: General;  Laterality: Right;   PORTACATH PLACEMENT N/A 01/03/2023   Procedure: INSERTION PORT-A-CATH WITH ULTRASOUND ANDREA;  Surgeon: Vanderbilt Ned, MD;  Location: [facility];  Service: General;  Laterality: N/A;   TOE SURGERY  2004   WISDOM TOOTH EXTRACTION  2003   Patient Active Problem List   Diagnosis Date Noted   Port-A-Cath in place 01/12/2023   Genetic testing 01/12/2023   Family history of breast cancer 12/27/2022   Family history of colon cancer 12/27/2022   Malignant neoplasm of upper-outer quadrant of right breast in female, estrogen receptor negative (HCC) 12/25/2022    PCP:   REFERRING PROVIDER: Mackey Chad, MD  REFERRING DIAG: Right breast Lymphedema  THERAPY DIAG:  Malignant neoplasm of upper-outer quadrant of right breast in female, estrogen receptor negative (HCC)  Abnormal posture  Breast swelling  Lymphedema, not elsewhere classified  ONSET DATE: October 2025  Rationale for Evaluation and Treatment: Rehabilitation  SUBJECTIVE:  SUBJECTIVE STATEMENT:  Pt noticed that her breast skin looks like orange peel. No soreness in breast. It feels tighter in the right breast. I started with my compression bra last week. She sees a print of her bra on her breast. Orange peel has improved some since wearing compression.  I feel a little limited in my shoulder reaching into the back seat.  PERTINENT HISTORY:   Patient was diagnosed on 12/12/2022 with right grade 2 invasive ductal carcinoma breast cancer. It measures 1.3 cm and is located in the upper outer quadrant.  It is ER/PR negative and HER2 positive with a Ki67 of 30%. She had a positive axillary lymph node. Completed neoadjuvant chemo. Underwent R breast lumpectomy and SLNB 0/1 on 06/05/23.  PAIN:   Are you having pain? No  PRECAUTIONS: Right UE lymphedema risk  RED FLAGS: None   WEIGHT BEARING RESTRICTIONS: No  FALLS:  Has patient fallen in last 6 months? No  LIVING ENVIRONMENT: Lives with: lives with their spouse, and son who is in college at [employer] Lives in: House/apartment   OCCUPATION: Adjustor for an insurance company   LEISURE: spend time with family and friends, cooking, phone,  HAND DOMINANCE: right   PRIOR LEVEL OF FUNCTION: Independent  PATIENT GOALS: Decrease breast swelling   OBJECTIVE: Note: Objective measures were completed at Evaluation unless otherwise noted.  COGNITION: Overall cognitive status: Within functional limits for tasks assessed   PALPATION: Mild tenderness at lateral breast incision,  OBSERVATIONS / OTHER ASSESSMENTS: mildly enlarged pores and orange peel greatest at medial breast, mild fibrosis medial breast with tenderness  SENSATION: Light touch: Deficits     POSTURE: forward head, rounded shoulders  UPPER EXTREMITY AROM/PROM:  A/PROM RIGHT   eval   Shoulder extension 60  Shoulder flexion 153  Shoulder abduction 149  Shoulder internal rotation 70  Shoulder external rotation 106    (Blank rows = not tested)  A/PROM LEFT   eval  Shoulder extension 57  Shoulder flexion 163  Shoulder abduction 175  Shoulder internal rotation 68  Shoulder external rotation 103    (Blank rows = not tested)  CERVICAL AROM: All within fxl limits:     UPPER EXTREMITY STRENGTH:   LYMPHEDEMA ASSESSMENTS:   SURGERY TYPE/DATE: R breast lumpectomy and SLNB 0/1 on 06/05/23.  NUMBER OF LYMPH NODES REMOVED: 0/1  CHEMOTHERAPY: YES  RADIATION:YES  HORMONE TREATMENT: NO  INFECTIONS: NO   LYMPHEDEMA ASSESSMENTS:   LANDMARK RIGHT  eval   At axilla    15 cm proximal to the proximal aspect of the olecranon process   10 cm proximal to the proximal aspect of the olecranon process   Olecranon process   15 cm proximal to the proximal aspect of the ulnar styloid process   10 cm proximal to the proximal aspect of the ulnar styloid process   Just distal to the ulnar styloid process   Across hand at thumb web space   At base of 2nd digit   (Blank rows = not tested)  LANDMARK LEFT  eval  At axilla    15 cm proximal to the proximal aspect of the olecranon process   10 cm proximal to the proximal aspect of the olecranon process   Olecranon process   15 cm proximal to the proximal aspect of the ulnar styloid process   10 cm proximal to  the proximal aspect of the ulnar styloid process   Just distal to the ulnar styloid process   Across hand  at thumb web space   At base of 2nd digit   (Blank rows = not tested)  Chest circumference just inferior to the axillae:  Chest circumference at the largest point:     BREAST COMPLAINTS QUESTIONNAIRE Pain:0  Heaviness:0 Swollen feeling:5 Tense Skin:5 Redness:0 Bra Print:5 Size of Pores:8 Hard feeling: 1 Total:    24 /80 A Score over 9 indicates lymphedema issues in the breast                                                                                                                             TREATMENT DATE:   12/19/2023 Educated pt in supine wand flexion and scaption and wall slides to restore normal Right shoulder ROM. Briefly discussed role of lymphatics and gentleness of technique with Manual lymphatic drainage. Discussed POC, LOS, treatment interventions,    PATIENT EDUCATION:  Education details: Educated pt in supine wand flexion and scaption and wall slides to restore normal Right shoulder ROM. Briefly discussed role of lymphatics and gentleness of technique with Manual lymphatic drainage. Discussed POC, LOS, treatment interventions, gave script for compression  bra Person educated: Patient Education method: Explanation, Demonstration, and Handouts Education comprehension: verbalized understanding and returned demonstration  HOME EXERCISE PROGRAM: Supine wand flexion and scaption, standing wall slides for abduction.   ASSESSMENT:  CLINICAL IMPRESSION: Patient is a 43 y.o. female who was seen today for physical therapy evaluation and treatment for complaints of right breast swelling/lymphedema which started in October 2025. She is s/p neoadjuvant chemotherapy and R breast lumpectomy with SLNB 0+/1 on 06/05/23. She completed radiation, and shortly after radiation developed some swelling in the right breast. It has improved some since starting back to her compression bra. She presents with left breast swelling with mild fibrosis and enlarged pores greatest medially. Incisions are well healed. Shoulder ROM on the right is mildly limited and she was given a HEP today to perform that will address the restricted areas. She was briefly educated in the lymphatic system today in preparation for manual lymphatic drainage techniques next visit.She will benefit from skilled PT to address deficits noted and return pt to PLOF.   OBJECTIVE IMPAIRMENTS: decreased knowledge of condition, increased edema, impaired flexibility, impaired UE functional use, and postural dysfunction.   ACTIVITY LIMITATIONS: lifting and reach over head  PARTICIPATION LIMITATIONS: none  PERSONAL FACTORS: 1-2 comorbidities: Right breast cancer s/p chemotherapy and radiation are also affecting patient's functional outcome.   REHAB POTENTIAL: Good  CLINICAL DECISION MAKING: Stable/uncomplicated  EVALUATION COMPLEXITY: Low  GOALS: Goals reviewed with patient? Yes  SHORT TERM GOALS=LONG TERM GOALS: Target date: 01/30/2023  Pt will be independent with MLD to decrease right breast swelling Baseline: Goal status: INITIAL  2.  Pt will note decreased breast swelling by 50% Baseline:  Goal  status: INITIAL  3.  Pt will be compliant with compression bra to decrease right breast lymphedema Baseline:  Goal status: INITIAL  4.  Breast complaints questionnaire will  decrease by at least 10 points to demonstrate improvement in swelling Baseline:  Goal status: INITIAL    PLAN:  PT FREQUENCY: 2x/week  PT DURATION: 6 weeks  PLANNED INTERVENTIONS: 97164- PT Re-evaluation, 97110-Therapeutic exercises, 97530- Therapeutic activity, 97112- Neuromuscular re-education, 97535- Self Care, [id number]- Manual therapy, 97760- Orthotic Initial, and [id number]- Orthotic/Prosthetic subsequent  PLAN FOR NEXT SESSION: measure chest next visit, initiate MLD to right breast and start instructing pt, check ROM  Grayce JINNY Sheldon, PT 12/19/2023, 1:58 PM

## 2023-12-19 ENCOUNTER — Ambulatory Visit: Attending: Hematology and Oncology

## 2023-12-19 DIAGNOSIS — R293 Abnormal posture: Secondary | ICD-10-CM | POA: Diagnosis present

## 2023-12-19 DIAGNOSIS — Z171 Estrogen receptor negative status [ER-]: Secondary | ICD-10-CM | POA: Insufficient documentation

## 2023-12-19 DIAGNOSIS — I890 Lymphedema, not elsewhere classified: Secondary | ICD-10-CM | POA: Insufficient documentation

## 2023-12-19 DIAGNOSIS — C50411 Malignant neoplasm of upper-outer quadrant of right female breast: Secondary | ICD-10-CM | POA: Diagnosis present

## 2023-12-19 DIAGNOSIS — Z483 Aftercare following surgery for neoplasm: Secondary | ICD-10-CM | POA: Insufficient documentation

## 2023-12-19 DIAGNOSIS — N63 Unspecified lump in unspecified breast: Secondary | ICD-10-CM | POA: Insufficient documentation

## 2023-12-19 NOTE — Patient Instructions (Addendum)
 SHOULDER: Flexion - Supine (Cane)        Cancer Rehab [phone]    Hold cane in both hands. Raise arms up overhead. Do not allow back to arch. Hold _5__ seconds. Do __5__ times; __2__ times a day.  Hands shoulder width apart Hands wider than shoulder width; V position    Copyright  VHI. All rights reserved.      _

## 2023-12-20 ENCOUNTER — Encounter: Payer: Self-pay | Admitting: *Deleted

## 2023-12-20 ENCOUNTER — Inpatient Hospital Stay: Attending: Hematology and Oncology

## 2023-12-20 VITALS — BP 145/96 | HR 92 | Temp 98.1°F | Resp 18 | Wt 298.2 lb

## 2023-12-20 DIAGNOSIS — C50411 Malignant neoplasm of upper-outer quadrant of right female breast: Secondary | ICD-10-CM | POA: Diagnosis present

## 2023-12-20 DIAGNOSIS — Z5112 Encounter for antineoplastic immunotherapy: Secondary | ICD-10-CM | POA: Insufficient documentation

## 2023-12-20 MED ORDER — ACETAMINOPHEN 325 MG PO TABS
650.0000 mg | ORAL_TABLET | Freq: Once | ORAL | Status: AC
  Administered 2023-12-20: 650 mg via ORAL
  Filled 2023-12-20: qty 2

## 2023-12-20 MED ORDER — DIPHENHYDRAMINE HCL 25 MG PO CAPS
50.0000 mg | ORAL_CAPSULE | Freq: Once | ORAL | Status: AC
  Administered 2023-12-20: 50 mg via ORAL
  Filled 2023-12-20: qty 2

## 2023-12-20 MED ORDER — TRASTUZUMAB-ANNS CHEMO 150 MG IV SOLR
750.0000 mg | Freq: Once | INTRAVENOUS | Status: AC
  Administered 2023-12-20: 750 mg via INTRAVENOUS
  Filled 2023-12-20: qty 35.71

## 2023-12-20 MED ORDER — SODIUM CHLORIDE 0.9 % IV SOLN: INTRAVENOUS | Status: DC

## 2023-12-20 MED ORDER — SODIUM CHLORIDE 0.9 % IV SOLN
420.0000 mg | Freq: Once | INTRAVENOUS | Status: AC
  Administered 2023-12-20: 420 mg via INTRAVENOUS
  Filled 2023-12-20: qty 14

## 2023-12-20 NOTE — Patient Instructions (Signed)
 [facility] - A DEPT OF [facility]  Discharge Instructions: Thank you for choosing [facility] to provide your oncology and hematology care.   If you have a lab appointment with the Cancer Center, please go directly to the Cancer Center and check in at the registration area.   Wear comfortable clothing and clothing appropriate for easy access to any Portacath or PICC line.   We strive to give you quality time with your provider. You may need to reschedule your appointment if you arrive late (15 or more minutes).  Arriving late affects you and other patients whose appointments are after yours.  Also, if you miss three or more appointments without notifying the office, you may be dismissed from the clinic at the provider's discretion.      For prescription refill requests, have your pharmacy contact our office and allow 72 hours for refills to be completed.    Today you received the following chemotherapy and/or immunotherapy agents: Kanjinti, Pertuzumab.       To help prevent nausea and vomiting after your treatment, we encourage you to take your nausea medication as directed.  BELOW ARE SYMPTOMS THAT SHOULD BE REPORTED IMMEDIATELY: *FEVER GREATER THAN 100.4 F (38 C) OR HIGHER *CHILLS OR SWEATING *NAUSEA AND VOMITING THAT IS NOT CONTROLLED WITH YOUR NAUSEA MEDICATION *UNUSUAL SHORTNESS OF BREATH *UNUSUAL BRUISING OR BLEEDING *URINARY PROBLEMS (pain or burning when urinating, or frequent urination) *BOWEL PROBLEMS (unusual diarrhea, constipation, pain near the anus) TENDERNESS IN MOUTH AND THROAT WITH OR WITHOUT PRESENCE OF ULCERS (sore throat, sores in mouth, or a toothache) UNUSUAL RASH, SWELLING OR PAIN  UNUSUAL VAGINAL DISCHARGE OR ITCHING   Items with * indicate a potential emergency and should be followed up as soon as possible or go to the Emergency Department if any problems should occur.  Please show the CHEMOTHERAPY ALERT CARD or  IMMUNOTHERAPY ALERT CARD at check-in to the Emergency Department and triage nurse.  Should you have questions after your visit or need to cancel or reschedule your appointment, please contact [facility] - A DEPT OF [facility]  Dept: [phone]  and follow the prompts.  Office hours are 8:00 a.m. to 4:30 p.m. Monday - Friday. Please note that voicemails left after 4:00 p.m. may not be returned until the following business day.  We are closed weekends and major holidays. You have access to a nurse at all times for urgent questions. Please call the main number to the clinic Dept: [phone] and follow the prompts.   For any non-urgent questions, you may also contact your provider using MyChart. We now offer e-Visits for anyone 62 and older to request care online for non-urgent symptoms. For details visit [email or web address].   Also download the MyChart app! Go to the app store, search "MyChart", open the app, select [place], and log in with your MyChart username and password.

## 2023-12-21 ENCOUNTER — Encounter: Payer: Self-pay | Admitting: General Practice

## 2023-12-21 NOTE — Progress Notes (Signed)
 SPIRITUAL CARE AND COUNSELING CONSULT NOTE   VISIT SUMMARY Chaplain missed Kara Webb in infusion yesterday, so reached her by phone this morning. Kara Webb was in good spirits, noting that she has finished immunotherapy; starts a new job Monday, which she is excited about; plans a self-care rest day today; and scheduled her follow-up mammogram for 01/18/2024 in order to allow this year's holidays to be free of the kind of stress she experienced last year. She is proud of how far she has come in the past year and celebrates her accomplishments and growth. Kara Webb reports no other needs at this time, but has direct Spiritual Care number in case future needs arise.  SPIRITUAL ENCOUNTER                                                                                                                                                                      Type of Visit: Follow up Care provided to:: Patient Referral source: Patient request Reason for visit: Routine spiritual support   SPIRITUAL FRAMEWORK  Presenting Themes: Meaning/purpose/sources of inspiration, Impactful experiences and emotions, Courage hope and growth Strengths: Upbeat attitude, autonomy/self-care, new job starting Monday   GOALS   Clinical Care Goals: Provide further availability as needs arise   INTERVENTIONS   Spiritual Care Interventions Made: Compassionate presence, Reflective listening, Normalization of emotions    INTERVENTION OUTCOMES   Outcomes: Connection to spiritual care, Awareness around self/spiritual resourses, Autonomy/agency  SPIRITUAL CARE PLAN   Spiritual Care Issues Still Outstanding: No further spiritual care needs at this time (see row info) Follow up plan : Patient plans to phone as needed/desired for follow-up support    Chaplain Olam Filiberto Lemming, [facility] Pager [phone] Voicemail [phone]

## 2024-01-01 ENCOUNTER — Ambulatory Visit: Admitting: Physical Therapy

## 2024-01-03 ENCOUNTER — Ambulatory Visit

## 2024-01-07 ENCOUNTER — Ambulatory Visit

## 2024-01-07 DIAGNOSIS — I890 Lymphedema, not elsewhere classified: Secondary | ICD-10-CM

## 2024-01-07 DIAGNOSIS — Z483 Aftercare following surgery for neoplasm: Secondary | ICD-10-CM

## 2024-01-07 DIAGNOSIS — Z171 Estrogen receptor negative status [ER-]: Secondary | ICD-10-CM

## 2024-01-07 DIAGNOSIS — C50411 Malignant neoplasm of upper-outer quadrant of right female breast: Secondary | ICD-10-CM | POA: Diagnosis not present

## 2024-01-07 DIAGNOSIS — N63 Unspecified lump in unspecified breast: Secondary | ICD-10-CM

## 2024-01-07 DIAGNOSIS — R293 Abnormal posture: Secondary | ICD-10-CM

## 2024-01-07 NOTE — Therapy (Signed)
 " OUTPATIENT PHYSICAL THERAPY  UPPER EXTREMITY ONCOLOGY EVALUATION  Patient Name: Kara Webb MRN: [id number] DOB:13-Jul-1980, 43 y.o., female Today's Date: 01/07/2024  END OF SESSION:  PT End of Session - 01/07/24 1500     Visit Number 2    Number of Visits 12    Date for Recertification  01/30/24    PT Start Time 1501    PT Stop Time 1556    PT Time Calculation (min) 55 min    Activity Tolerance Patient tolerated treatment well    Behavior During Therapy [facility] for tasks assessed/performed          Past Medical History:  Diagnosis Date   Breast cancer (HCC)    BV (bacterial vaginosis)    Cold intolerance 01/16/2005   Fatigue 01/16/2005   H/O chlamydia infection 08/12/2010   H/O urinary frequency 11/16/2000   H/O vaginal discharge 11/17/2003   H/O varicella    Heart murmur    Born with murmur   History of radiation therapy    Right breast-07/05/23-08/20/23- Dr. Lynwood Nasuti   HSV-1 infection    Hx: UTI (urinary tract infection)    Pelvic pain    Vulvovaginal candidiasis 04/16/2005   Yeast infection    Past Surgical History:  Procedure Laterality Date   BREAST BIOPSY Right 12/18/2022   US  RT BREAST BX W LOC DEV 1ST LESION IMG BX SPEC US  GUIDE 12/18/2022 [facility]   BREAST BIOPSY  06/01/2023   US  RT RADIOACTIVE SEED LOC 06/01/2023 [facility]   BREAST BIOPSY  06/01/2023   MM RT RADIOACTIVE SEED LOC MAMMO GUIDE 06/01/2023 [facility]   BREAST BIOPSY  06/01/2023   MM RT RADIOACTIVE SEED EA ADD LESION LOC MAMMO GUIDE 06/01/2023 [facility]   BREAST LUMPECTOMY WITH RADIOACTIVE SEED AND SENTINEL LYMPH NODE BIOPSY Right 06/05/2023   Procedure: RIGHT BREAST SEED LUMPECTOMY x2, RIGHT SEED TARGETED LYMPH NODE BIOPSY, RIGHT SENTINEL LYMPH NODE MAPPING;  Surgeon: Vanderbilt Ned, MD;  Location: [facility];  Service: General;  Laterality: Right;  RIGHT BREAST SEED LUMPECTOMY x2, RIGHT SEED TARGETED LYMPH NODE BIOPSY, RIGHT SENTINEL LYMPH NODE  MAPPING 90 MIN   IR CV LINE INJECTION  04/13/2023   PORT-A-CATH REMOVAL Right 12/06/2023   Procedure: REMOVAL PORT-A-CATH;  Surgeon: Vanderbilt Ned, MD;  Location: [facility];  Service: General;  Laterality: Right;   PORTACATH PLACEMENT N/A 01/03/2023   Procedure: INSERTION PORT-A-CATH WITH ULTRASOUND ANDREA;  Surgeon: Vanderbilt Ned, MD;  Location: [facility];  Service: General;  Laterality: N/A;   TOE SURGERY  2004   WISDOM TOOTH EXTRACTION  2003   Patient Active Problem List   Diagnosis Date Noted   Port-A-Cath in place 01/12/2023   Genetic testing 01/12/2023   Family history of breast cancer 12/27/2022   Family history of colon cancer 12/27/2022   Malignant neoplasm of upper-outer quadrant of right breast in female, estrogen receptor negative (HCC) 12/25/2022    PCP:   REFERRING PROVIDER: Mackey Chad, MD  REFERRING DIAG: Right breast Lymphedema  THERAPY DIAG:  Malignant neoplasm of upper-outer quadrant of right breast in female, estrogen receptor negative (HCC)  Abnormal posture  Breast swelling  Lymphedema, not elsewhere classified  Aftercare following surgery for neoplasm  ONSET DATE: October 2025  Rationale for Evaluation and Treatment: Rehabilitation  SUBJECTIVE:  SUBJECTIVE STATEMENT:   01/07/2024 I have been doing the exercises 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 My right breast feels looser and not as drawn up.  I spoke to [employer]. They will switch out the bras for compression bras for me. They have to order them for me.  EVAL Pt noticed that her breast skin looks like orange peel. No soreness in breast. It feels tighter in the right breast. I started with my compression bra last week. She sees a  print of her bra on her breast. Orange peel has improved some since wearing compression.  I feel a little limited in my shoulder reaching into the back seat.  PERTINENT HISTORY:   Patient was diagnosed on 12/12/2022 with right grade 2 invasive ductal carcinoma breast cancer. It measures 1.3 cm and is located in the upper outer quadrant. It is ER/PR negative and HER2 positive with a Ki67 of 30%. She had a positive axillary lymph node. Completed neoadjuvant chemo. Underwent R breast lumpectomy and SLNB 0/1 on 06/05/23.  PAIN:   Are you having pain? No  PRECAUTIONS: Right UE lymphedema risk  RED FLAGS: None   WEIGHT BEARING RESTRICTIONS: No  FALLS:  Has patient fallen in last 6 months? No  LIVING ENVIRONMENT: Lives with: lives with their spouse, and son who is in college at [employer] Lives in: House/apartment   OCCUPATION: Adjustor for an insurance company   LEISURE: spend time with family and friends, cooking, phone,  HAND DOMINANCE: right   PRIOR LEVEL OF FUNCTION: Independent  PATIENT GOALS: Decrease breast swelling   OBJECTIVE: Note: Objective measures were completed at Evaluation unless otherwise noted.  COGNITION: Overall cognitive status: Within functional limits for tasks assessed   PALPATION: Mild tenderness at lateral breast incision,  OBSERVATIONS / OTHER ASSESSMENTS: mildly enlarged pores and orange peel greatest at medial breast, mild fibrosis medial breast with tenderness  SENSATION: Light touch: Deficits     POSTURE: forward head, rounded shoulders  UPPER EXTREMITY AROM/PROM:  A/PROM RIGHT   eval  RIGHT 01/07/2024  Shoulder extension 60   Shoulder flexion 153 158  Shoulder abduction 149 171  Shoulder internal rotation 70   Shoulder external rotation 106     (Blank rows = not tested)  A/PROM LEFT   eval  Shoulder extension 57  Shoulder flexion 163  Shoulder abduction 175  Shoulder internal rotation 68  Shoulder external rotation 103     (Blank rows = not tested)  CERVICAL AROM: All within fxl limits:     UPPER EXTREMITY STRENGTH:   LYMPHEDEMA ASSESSMENTS:   SURGERY TYPE/DATE: R breast lumpectomy and SLNB 0/1 on 06/05/23.  NUMBER OF LYMPH NODES REMOVED: 0/1  CHEMOTHERAPY: YES  RADIATION:YES  HORMONE TREATMENT: NO  INFECTIONS: NO   LYMPHEDEMA ASSESSMENTS:   LANDMARK RIGHT  eval  At axilla    15 cm proximal to the proximal aspect of the olecranon process   10 cm proximal to the proximal aspect of the olecranon process   Olecranon process   15 cm proximal to the proximal aspect of the ulnar styloid process   10 cm proximal to the proximal aspect of the ulnar styloid process   Just distal to the ulnar styloid process   Across hand at thumb web space   At base of 2nd digit   (Blank rows = not tested)  LANDMARK LEFT  eval  At axilla    15 cm proximal  to the proximal aspect of the olecranon process   10 cm proximal to the proximal aspect of the olecranon process   Olecranon process   15 cm proximal to the proximal aspect of the ulnar styloid process   10 cm proximal to  the proximal aspect of the ulnar styloid process   Just distal to the ulnar styloid process   Across hand at thumb web space   At base of 2nd digit   (Blank rows = not tested)  Chest circumference just inferior to the axillae: 119.5 Chest circumference at the largest point:  132 cm   BREAST COMPLAINTS QUESTIONNAIRE Pain:0  Heaviness:0 Swollen feeling:5 Tense Skin:5 Redness:0 Bra Print:5 Size of Pores:8 Hard feeling: 1 Total:    24 /80 A Score over 9 indicates lymphedema issues in the breast                                                                                                                             TREATMENT DATE:    01/07/2024 Pulleys x 2 min ea flexion and abduction to improve ROM Single arm doorway stretch x 3, 30 sec Measured AROM Right shoulder with good improvement esp with abduction Reminded  pt using poster about superficial nature of lymphatics and the reason for light stretch. Pt instructed in MLD: all techniques performed first by PT and then by pt modified for pt position. In supine: Short neck, 5 diaphragmatic breaths, Left  and Right axillary nodes and establishment of interaxillary pathway, R inguinal nodes and establishment of axilloinguinal pathway, then R breast moving fluid towards pathways spending extra time in any areas of fibrosis then retracing all steps and ending with LN's.  Pt was given illustrated and written handout.  12/19/2023 Educated pt in supine wand flexion and scaption and wall slides to restore normal Right shoulder ROM. Briefly discussed role of lymphatics and gentleness of technique with Manual lymphatic drainage. Discussed POC, LOS, treatment interventions,    PATIENT EDUCATION:  Education details: Educated pt in supine wand flexion and scaption and wall slides to restore normal Right shoulder ROM. Briefly discussed role of lymphatics and gentleness of technique with Manual lymphatic drainage. Discussed POC, LOS, treatment interventions, gave script for compression bra Person educated: Patient Education method: Explanation, Demonstration, and Handouts Education comprehension: verbalized understanding and returned demonstration  HOME EXERCISE PROGRAM: Supine wand flexion and scaption, standing wall slides for abduction.   ASSESSMENT:  CLINICAL IMPRESSION:    EVAL Patient is a 43 y.o. female who was seen today for physical therapy evaluation and treatment for complaints of right breast swelling/lymphedema which started in October 2025. She is s/p neoadjuvant chemotherapy and R breast lumpectomy with SLNB 0+/1 on 06/05/23. She completed radiation, and shortly after radiation developed some swelling in the right breast. It has improved some since starting back to her compression bra. She presents with left breast swelling with mild fibrosis and enlarged  pores greatest medially. Incisions are well healed.  Shoulder ROM on the right is mildly limited and she was given a HEP today to perform that will address the restricted areas. She was briefly educated in the lymphatic system today in preparation for manual lymphatic drainage techniques next visit.She will benefit from skilled PT to address deficits noted and return pt to PLOF.   OBJECTIVE IMPAIRMENTS: decreased knowledge of condition, increased edema, impaired flexibility, impaired UE functional use, and postural dysfunction.   ACTIVITY LIMITATIONS: lifting and reach over head  PARTICIPATION LIMITATIONS: none  PERSONAL FACTORS: 1-2 comorbidities: Right breast cancer s/p chemotherapy and radiation are also affecting patient's functional outcome.   REHAB POTENTIAL: Good  CLINICAL DECISION MAKING: Stable/uncomplicated  EVALUATION COMPLEXITY: Low  GOALS: Goals reviewed with patient? Yes  SHORT TERM GOALS=LONG TERM GOALS: Target date: 01/30/2023  Pt will be independent with MLD to decrease right breast swelling Baseline: Goal status: INITIAL  2.  Pt will note decreased breast swelling by 50% Baseline:  Goal status: INITIAL  3.  Pt will be compliant with compression bra to decrease right breast lymphedema Baseline:  Goal status: INITIAL  4.  Breast complaints questionnaire will decrease by at least 10 points to demonstrate improvement in swelling Baseline:  Goal status: INITIAL    PLAN:  PT FREQUENCY: 2x/week  PT DURATION: 6 weeks  PLANNED INTERVENTIONS: 97164- PT Re-evaluation, 97110-Therapeutic exercises, 97530- Therapeutic activity, 97112- Neuromuscular re-education, 97535- Self Care, 02859- Manual therapy, 97760- Orthotic Initial, and [id number]- Orthotic/Prosthetic subsequent  PLAN FOR NEXT SESSION:  review MLD to right breast and start instructing pt, check ROM  Grayce JINNY Sheldon, PT 01/07/2024, 3:58 PM  "

## 2024-01-09 ENCOUNTER — Ambulatory Visit

## 2024-01-09 DIAGNOSIS — I890 Lymphedema, not elsewhere classified: Secondary | ICD-10-CM

## 2024-01-09 DIAGNOSIS — Z483 Aftercare following surgery for neoplasm: Secondary | ICD-10-CM

## 2024-01-09 DIAGNOSIS — C50411 Malignant neoplasm of upper-outer quadrant of right female breast: Secondary | ICD-10-CM | POA: Diagnosis not present

## 2024-01-09 DIAGNOSIS — N63 Unspecified lump in unspecified breast: Secondary | ICD-10-CM

## 2024-01-09 DIAGNOSIS — R293 Abnormal posture: Secondary | ICD-10-CM

## 2024-01-09 NOTE — Therapy (Signed)
 " OUTPATIENT PHYSICAL THERAPY  UPPER EXTREMITY ONCOLOGY EVALUATION  Patient Name: Kara Webb MRN: [id number] DOB:12/08/1980, 43 y.o., female Today's Date: 01/09/2024  END OF SESSION:  PT End of Session - 01/09/24 0806     Visit Number 3    Number of Visits 12    Date for Recertification  01/30/24    PT Start Time 0806    PT Stop Time 0852    PT Time Calculation (min) 46 min    Activity Tolerance Patient tolerated treatment well    Behavior During Therapy [facility] for tasks assessed/performed          Past Medical History:  Diagnosis Date   Breast cancer (HCC)    BV (bacterial vaginosis)    Cold intolerance 01/16/2005   Fatigue 01/16/2005   H/O chlamydia infection 08/12/2010   H/O urinary frequency 11/16/2000   H/O vaginal discharge 11/17/2003   H/O varicella    Heart murmur    Born with murmur   History of radiation therapy    Right breast-07/05/23-08/20/23- Dr. Lynwood Nasuti   HSV-1 infection    Hx: UTI (urinary tract infection)    Pelvic pain    Vulvovaginal candidiasis 04/16/2005   Yeast infection    Past Surgical History:  Procedure Laterality Date   BREAST BIOPSY Right 12/18/2022   US  RT BREAST BX W LOC DEV 1ST LESION IMG BX SPEC US  GUIDE 12/18/2022 [facility]   BREAST BIOPSY  06/01/2023   US  RT RADIOACTIVE SEED LOC 06/01/2023 [facility]   BREAST BIOPSY  06/01/2023   MM RT RADIOACTIVE SEED LOC MAMMO GUIDE 06/01/2023 [facility]   BREAST BIOPSY  06/01/2023   MM RT RADIOACTIVE SEED EA ADD LESION LOC MAMMO GUIDE 06/01/2023 [facility]   BREAST LUMPECTOMY WITH RADIOACTIVE SEED AND SENTINEL LYMPH NODE BIOPSY Right 06/05/2023   Procedure: RIGHT BREAST SEED LUMPECTOMY x2, RIGHT SEED TARGETED LYMPH NODE BIOPSY, RIGHT SENTINEL LYMPH NODE MAPPING;  Surgeon: Vanderbilt Ned, MD;  Location: [facility];  Service: General;  Laterality: Right;  RIGHT BREAST SEED LUMPECTOMY x2, RIGHT SEED TARGETED LYMPH NODE BIOPSY, RIGHT SENTINEL LYMPH NODE  MAPPING 90 MIN   IR CV LINE INJECTION  04/13/2023   PORT-A-CATH REMOVAL Right 12/06/2023   Procedure: REMOVAL PORT-A-CATH;  Surgeon: Vanderbilt Ned, MD;  Location: [facility];  Service: General;  Laterality: Right;   PORTACATH PLACEMENT N/A 01/03/2023   Procedure: INSERTION PORT-A-CATH WITH ULTRASOUND ANDREA;  Surgeon: Vanderbilt Ned, MD;  Location:  [facility];  Service: General;  Laterality: N/A;   TOE SURGERY  2004   WISDOM TOOTH EXTRACTION  2003   Patient Active Problem List   Diagnosis Date Noted   Port-A-Cath in place 01/12/2023   Genetic testing 01/12/2023   Family history of breast cancer 12/27/2022   Family history of colon cancer 12/27/2022   Malignant neoplasm of upper-outer quadrant of right breast in female, estrogen receptor negative (HCC) 12/25/2022    PCP:   REFERRING PROVIDER: Mackey Chad, MD  REFERRING DIAG: Right breast Lymphedema  THERAPY DIAG:  Malignant neoplasm of upper-outer quadrant of right breast in female, estrogen receptor negative (HCC)  Abnormal posture  Breast swelling  Lymphedema, not elsewhere classified  Aftercare following surgery for neoplasm  ONSET DATE: October 2025  Rationale for Evaluation and Treatment: Rehabilitation  SUBJECTIVE:  SUBJECTIVE STATEMENT:   I Tried some of the manual lymphatic drainage.  I felt a little better after our session on Monday.They ordered my new bras but they aren't in yet.  EVAL Pt noticed that her breast skin looks like orange peel. No soreness in breast. It feels tighter in the right breast. I started with my compression bra last week. She sees a print of her bra on her breast. Orange peel has improved some since wearing compression.  I feel a little limited in my shoulder reaching  into the back seat.  PERTINENT HISTORY:   Patient was diagnosed on 12/12/2022 with right grade 2 invasive ductal carcinoma breast cancer. It measures 1.3 cm and is located in the upper outer quadrant. It is ER/PR negative and HER2 positive with a Ki67 of 30%. She had a positive axillary lymph node. Completed neoadjuvant chemo. Underwent R breast lumpectomy and SLNB 0/1 on 06/05/23.  PAIN:   Are you having pain? No  PRECAUTIONS: Right UE lymphedema risk  RED FLAGS: None   WEIGHT BEARING RESTRICTIONS: No  FALLS:  Has patient fallen in last 6 months? No  LIVING ENVIRONMENT: Lives with: lives with their spouse, and son who is in college at [employer] Lives in: House/apartment   OCCUPATION: Adjustor for an insurance company   LEISURE: spend time with family and friends, cooking, phone,  HAND DOMINANCE: right   PRIOR LEVEL OF FUNCTION: Independent  PATIENT GOALS: Decrease breast swelling   OBJECTIVE: Note: Objective measures were completed at Evaluation unless otherwise noted.  COGNITION: Overall cognitive status: Within functional limits for tasks assessed   PALPATION: Mild tenderness at lateral breast incision,  OBSERVATIONS / OTHER ASSESSMENTS: mildly enlarged pores and orange peel greatest at medial breast, mild fibrosis medial breast with tenderness  SENSATION: Light touch: Deficits     POSTURE: forward head, rounded shoulders  UPPER EXTREMITY AROM/PROM:  A/PROM RIGHT   eval  RIGHT 01/07/2024  Shoulder extension 60   Shoulder flexion 153 158  Shoulder abduction 149 171  Shoulder internal rotation 70   Shoulder external rotation 106     (Blank rows = not tested)  A/PROM LEFT   eval  Shoulder extension 57  Shoulder flexion 163  Shoulder abduction 175  Shoulder internal rotation 68  Shoulder external rotation 103    (Blank rows = not tested)  CERVICAL AROM: All within fxl limits:     UPPER EXTREMITY STRENGTH:   LYMPHEDEMA ASSESSMENTS:   SURGERY  TYPE/DATE: R breast lumpectomy and SLNB 0/1 on 06/05/23.  NUMBER OF LYMPH NODES REMOVED: 0/1  CHEMOTHERAPY: YES  RADIATION:YES  HORMONE TREATMENT: NO  INFECTIONS: NO   LYMPHEDEMA ASSESSMENTS:   LANDMARK RIGHT  eval  At axilla    15 cm proximal to the proximal aspect of the olecranon process   10 cm proximal to the proximal aspect of the olecranon process   Olecranon process   15 cm proximal to the proximal aspect of the ulnar styloid process   10 cm proximal to the proximal aspect of the ulnar styloid process   Just distal to the ulnar styloid process   Across hand at thumb web space   At base of 2nd digit   (Blank rows = not tested)  LANDMARK LEFT  eval  At axilla    15 cm proximal to the proximal aspect of the olecranon process   10 cm proximal to the proximal aspect of the olecranon process   Olecranon process   15 cm  proximal to the proximal aspect of the ulnar styloid process   10 cm proximal to  the proximal aspect of the ulnar styloid process   Just distal to the ulnar styloid process   Across hand at thumb web space   At base of 2nd digit   (Blank rows = not tested)  Chest circumference just inferior to the axillae: 119.5 Chest circumference at the largest point:  132 cm   BREAST COMPLAINTS QUESTIONNAIRE Pain:0  Heaviness:0 Swollen feeling:5 Tense Skin:5 Redness:0 Bra Print:5 Size of Pores:8 Hard feeling: 1 Total:    24 /80 A Score over 9 indicates lymphedema issues in the breast                                                                                                                             TREATMENT DATE:  01/09/2024 Pt instructed in MLD: all techniques performed first by PT and then by pt modified for pt position. In supine: Short neck, 5 diaphragmatic breaths, Left  and Right axillary nodes and establishment of interaxillary pathway, R inguinal nodes and establishment of axilloinguinal pathway, then R breast moving fluid towards  pathways spending extra time in any areas of fibrosis then retracing all steps and ending with LN's. Alos tried right axillo-inguinal pathway in SL which she preferred. Discussed Flexi touch briefly and gave pt flyer.    01/07/2024 Pulleys x 2 min ea flexion and abduction to improve ROM Single arm doorway stretch x 3, 30 sec Measured AROM Right shoulder with good improvement esp with abduction Reminded pt using poster about superficial nature of lymphatics and the reason for light stretch. Pt instructed in MLD: all techniques performed first by PT and then by pt modified for pt position. In supine: Short neck, 5 diaphragmatic breaths, Left  and Right axillary nodes and establishment of interaxillary pathway, R inguinal nodes and establishment of axilloinguinal pathway, then R breast moving fluid towards pathways spending extra time in any areas of fibrosis then retracing all steps and ending with LN's.  Pt was given illustrated and written handout.  12/19/2023 Educated pt in supine wand flexion and scaption and wall slides to restore normal Right shoulder ROM. Briefly discussed role of lymphatics and gentleness of technique with Manual lymphatic drainage. Discussed POC, LOS, treatment interventions,    PATIENT EDUCATION:  Education details: Educated pt in supine wand flexion and scaption and wall slides to restore normal Right shoulder ROM. Briefly discussed role of lymphatics and gentleness of technique with Manual lymphatic drainage. Discussed POC, LOS, treatment interventions, gave script for compression bra Person educated: Patient Education method: Explanation, Demonstration, and Handouts Education comprehension: verbalized understanding and returned demonstration  HOME EXERCISE PROGRAM: Supine wand flexion and scaption, standing wall slides for abduction.   ASSESSMENT:  CLINICAL IMPRESSION:  Pt did very well with instruction today requiring occasion VC's and TC's to perform  correctly but did very well with light stretch and pressure. Shoulder ROM continues to look  very good.  EVAL Patient is a 43 y.o. female who was seen today for physical therapy evaluation and treatment for complaints of right breast swelling/lymphedema which started in October 2025. She is s/p neoadjuvant chemotherapy and R breast lumpectomy with SLNB 0+/1 on 06/05/23. She completed radiation, and shortly after radiation developed some swelling in the right breast. It has improved some since starting back to her compression bra. She presents with left breast swelling with mild fibrosis and enlarged pores greatest medially. Incisions are well healed. Shoulder ROM on the right is mildly limited and she was given a HEP today to perform that will address the restricted areas. She was briefly educated in the lymphatic system today in preparation for manual lymphatic drainage techniques next visit.She will benefit from skilled PT to address deficits noted and return pt to PLOF.   OBJECTIVE IMPAIRMENTS: decreased knowledge of condition, increased edema, impaired flexibility, impaired UE functional use, and postural dysfunction.   ACTIVITY LIMITATIONS: lifting and reach over head  PARTICIPATION LIMITATIONS: none  PERSONAL FACTORS: 1-2 comorbidities: Right breast cancer s/p chemotherapy and radiation are also affecting patient's functional outcome.   REHAB POTENTIAL: Good  CLINICAL DECISION MAKING: Stable/uncomplicated  EVALUATION COMPLEXITY: Low  GOALS: Goals reviewed with patient? Yes  SHORT TERM GOALS=LONG TERM GOALS: Target date: 01/30/2023  Pt will be independent with MLD to decrease right breast swelling Baseline: Goal status: INITIAL  2.  Pt will note decreased breast swelling by 50% Baseline:  Goal status: INITIAL  3.  Pt will be compliant with compression bra to decrease right breast lymphedema Baseline:  Goal status: INITIAL  4.  Breast complaints questionnaire will decrease by at  least 10 points to demonstrate improvement in swelling Baseline:  Goal status: INITIAL    PLAN:  PT FREQUENCY: 2x/week  PT DURATION: 6 weeks  PLANNED INTERVENTIONS: [id number]- PT Re-evaluation, 97110-Therapeutic exercises, 97530- Therapeutic activity, 97112- Neuromuscular re-education, 97535- Self Care, [id number]- Manual therapy, 97760- Orthotic Initial, and [id number]- Orthotic/Prosthetic subsequent  PLAN FOR NEXT SESSION:  review MLD to right breast and start instructing pt, check ROM  Grayce JINNY Sheldon, PT 01/09/2024, 8:56 AM  "

## 2024-01-14 ENCOUNTER — Encounter: Payer: Self-pay | Admitting: Physical Therapy

## 2024-01-14 ENCOUNTER — Ambulatory Visit: Admitting: Physical Therapy

## 2024-01-14 DIAGNOSIS — Z483 Aftercare following surgery for neoplasm: Secondary | ICD-10-CM

## 2024-01-14 DIAGNOSIS — R293 Abnormal posture: Secondary | ICD-10-CM

## 2024-01-14 DIAGNOSIS — C50411 Malignant neoplasm of upper-outer quadrant of right female breast: Secondary | ICD-10-CM | POA: Diagnosis not present

## 2024-01-14 DIAGNOSIS — N63 Unspecified lump in unspecified breast: Secondary | ICD-10-CM

## 2024-01-14 DIAGNOSIS — I890 Lymphedema, not elsewhere classified: Secondary | ICD-10-CM

## 2024-01-14 NOTE — Therapy (Signed)
 " OUTPATIENT PHYSICAL THERAPY  UPPER EXTREMITY ONCOLOGY TREATMENT  Patient Name: Kara Webb MRN: [id number] DOB:1980-06-08, 43 y.o., female Today's Date: 01/14/2024  END OF SESSION:  PT End of Session - 01/14/24 1657     Visit Number 4    Number of Visits 12    Date for Recertification  01/30/24    PT Start Time 1604    PT Stop Time 1655    PT Time Calculation (min) 51 min    Activity Tolerance Patient tolerated treatment well    Behavior During Therapy [facility] for tasks assessed/performed           Past Medical History:  Diagnosis Date   Breast cancer (HCC)    BV (bacterial vaginosis)    Cold intolerance 01/16/2005   Fatigue 01/16/2005   H/O chlamydia infection 08/12/2010   H/O urinary frequency 11/16/2000   H/O vaginal discharge 11/17/2003   H/O varicella    Heart murmur    Born with murmur   History of radiation therapy    Right breast-07/05/23-08/20/23- Dr. Lynwood Nasuti   HSV-1 infection    Hx: UTI (urinary tract infection)    Pelvic pain    Vulvovaginal candidiasis 04/16/2005   Yeast infection    Past Surgical History:  Procedure Laterality Date   BREAST BIOPSY Right 12/18/2022   US  RT BREAST BX W LOC DEV 1ST LESION IMG BX SPEC US  GUIDE 12/18/2022 [facility]   BREAST BIOPSY  06/01/2023   US  RT RADIOACTIVE SEED LOC 06/01/2023 [facility]   BREAST BIOPSY  06/01/2023   MM RT RADIOACTIVE SEED LOC MAMMO GUIDE 06/01/2023 [facility]   BREAST BIOPSY  06/01/2023   MM RT RADIOACTIVE SEED EA ADD LESION LOC MAMMO GUIDE 06/01/2023 [facility]   BREAST LUMPECTOMY WITH RADIOACTIVE SEED AND SENTINEL LYMPH NODE BIOPSY Right 06/05/2023   Procedure: RIGHT BREAST SEED LUMPECTOMY x2, RIGHT SEED TARGETED LYMPH NODE BIOPSY, RIGHT SENTINEL LYMPH NODE MAPPING;  Surgeon: Vanderbilt Ned, MD;  Location: [facility];  Service: General;  Laterality: Right;  RIGHT BREAST SEED LUMPECTOMY x2, RIGHT SEED TARGETED LYMPH NODE BIOPSY, RIGHT SENTINEL LYMPH NODE  MAPPING 90 MIN   IR CV LINE INJECTION  04/13/2023   PORT-A-CATH REMOVAL Right 12/06/2023   Procedure: REMOVAL PORT-A-CATH;  Surgeon: Vanderbilt Ned, MD;  Location: [facility];  Service: General;  Laterality: Right;   PORTACATH PLACEMENT N/A 01/03/2023   Procedure: INSERTION PORT-A-CATH WITH ULTRASOUND ANDREA;  Surgeon: Vanderbilt Ned, MD;  Location: [facility];  Service: General;  Laterality: N/A;   TOE SURGERY  2004   WISDOM TOOTH EXTRACTION  2003   Patient Active Problem List   Diagnosis Date Noted   Port-A-Cath in place 01/12/2023   Genetic testing 01/12/2023   Family history of breast cancer 12/27/2022   Family history of colon cancer 12/27/2022   Malignant neoplasm of upper-outer quadrant of right breast in female, estrogen receptor negative (HCC) 12/25/2022    PCP:   REFERRING PROVIDER: Mackey Chad, MD  REFERRING DIAG: Right breast Lymphedema  THERAPY DIAG:  Lymphedema, not elsewhere classified  Aftercare following surgery for neoplasm  Breast swelling  Abnormal posture  Malignant neoplasm of upper-outer quadrant of right breast in female, estrogen receptor negative [facility])  ONSET DATE: October 2025  Rationale for Evaluation and Treatment: Rehabilitation  SUBJECTIVE:  SUBJECTIVE STATEMENT:  I don't like the self massage. I don't feel like I am doing it right.   EVAL Pt noticed that her breast skin looks like orange peel. No soreness in breast. It feels tighter in the right breast. I started with my compression bra last week. She sees a print of her bra on her breast. Orange peel has improved some since wearing compression.  I feel a little limited in my shoulder reaching into the back seat.  PERTINENT HISTORY:   Patient was diagnosed on 12/12/2022  with right grade 2 invasive ductal carcinoma breast cancer. It measures 1.3 cm and is located in the upper outer quadrant. It is ER/PR negative and HER2 positive with a Ki67 of 30%. She had a positive axillary lymph node. Completed neoadjuvant chemo. Underwent R breast lumpectomy and SLNB 0/1 on 06/05/23.  PAIN:   Are you having pain? No  PRECAUTIONS: Right UE lymphedema risk  RED FLAGS: None   WEIGHT BEARING RESTRICTIONS: No  FALLS:  Has patient fallen in last 6 months? No  LIVING ENVIRONMENT: Lives with: lives with their spouse, and son who is in college at [employer] Lives in: House/apartment   OCCUPATION: Adjustor for an insurance company   LEISURE: spend time with family and friends, cooking, phone,  HAND DOMINANCE: right   PRIOR LEVEL OF FUNCTION: Independent  PATIENT GOALS: Decrease breast swelling   OBJECTIVE: Note: Objective measures were completed at Evaluation unless otherwise noted.  COGNITION: Overall cognitive status: Within functional limits for tasks assessed   PALPATION: Mild tenderness at lateral breast incision,  OBSERVATIONS / OTHER ASSESSMENTS: mildly enlarged pores and orange peel greatest at medial breast, mild fibrosis medial breast with tenderness  SENSATION: Light touch: Deficits     POSTURE: forward head, rounded shoulders  UPPER EXTREMITY AROM/PROM:  A/PROM RIGHT   eval  RIGHT 01/07/2024  Shoulder extension 60   Shoulder flexion 153 158  Shoulder abduction 149 171  Shoulder internal rotation 70   Shoulder external rotation 106     (Blank rows = not tested)  A/PROM LEFT   eval  Shoulder extension 57  Shoulder flexion 163  Shoulder abduction 175  Shoulder internal rotation 68  Shoulder external rotation 103    (Blank rows = not tested)  CERVICAL AROM: All within fxl limits:     UPPER EXTREMITY STRENGTH:   LYMPHEDEMA ASSESSMENTS:   SURGERY TYPE/DATE: R breast lumpectomy and SLNB 0/1 on 06/05/23.  NUMBER OF LYMPH NODES  REMOVED: 0/1  CHEMOTHERAPY: YES  RADIATION:YES  HORMONE TREATMENT: NO  INFECTIONS: NO   LYMPHEDEMA ASSESSMENTS:   LANDMARK RIGHT  eval  At axilla    15 cm proximal to the proximal aspect of the olecranon process   10 cm proximal to the proximal aspect of the olecranon process   Olecranon process   15 cm proximal to the proximal aspect of the ulnar styloid process   10 cm proximal to the proximal aspect of the ulnar styloid process   Just distal to the ulnar styloid process   Across hand at thumb web space   At base of 2nd digit   (Blank rows = not tested)  LANDMARK LEFT  eval  At axilla    15 cm proximal to the proximal aspect of the olecranon process   10 cm proximal to the proximal aspect of the olecranon process   Olecranon process   15 cm proximal to the proximal aspect of the ulnar styloid process   10  cm proximal to  the proximal aspect of the ulnar styloid process   Just distal to the ulnar styloid process   Across hand at thumb web space   At base of 2nd digit   (Blank rows = not tested)  Chest circumference just inferior to the axillae: 119.5 Chest circumference at the largest point:  132 cm   BREAST COMPLAINTS QUESTIONNAIRE Pain:0  Heaviness:0 Swollen feeling:5 Tense Skin:5 Redness:0 Bra Print:5 Size of Pores:8 Hard feeling: 1 Total:    24 /80 A Score over 9 indicates lymphedema issues in the breast                                                                                                                             TREATMENT DATE:  01/14/2024 Pt instructed in MLD: all techniques performed first by PT and then had pt return demo each step. In supine: Short neck, 5 diaphragmatic breaths, Left  and Right axillary nodes and establishment of interaxillary pathway, R inguinal nodes and establishment of axilloinguinal pathway, then R breast moving fluid towards pathways spending extra time in any areas of fibrosis then retracing all steps and ending  with LN's.   01/09/2024 Pt instructed in MLD: all techniques performed first by PT and then by pt modified for pt position. In supine: Short neck, 5 diaphragmatic breaths, Left  and Right axillary nodes and establishment of interaxillary pathway, R inguinal nodes and establishment of axilloinguinal pathway, then R breast moving fluid towards pathways spending extra time in any areas of fibrosis then retracing all steps and ending with LN's. Alos tried right axillo-inguinal pathway in SL which she preferred. Discussed Flexi touch briefly and gave pt flyer.    01/07/2024 Pulleys x 2 min ea flexion and abduction to improve ROM Single arm doorway stretch x 3, 30 sec Measured AROM Right shoulder with good improvement esp with abduction Reminded pt using poster about superficial nature of lymphatics and the reason for light stretch. Pt instructed in MLD: all techniques performed first by PT and then by pt modified for pt position. In supine: Short neck, 5 diaphragmatic breaths, Left  and Right axillary nodes and establishment of interaxillary pathway, R inguinal nodes and establishment of axilloinguinal pathway, then R breast moving fluid towards pathways spending extra time in any areas of fibrosis then retracing all steps and ending with LN's.  Pt was given illustrated and written handout.  12/19/2023 Educated pt in supine wand flexion and scaption and wall slides to restore normal Right shoulder ROM. Briefly discussed role of lymphatics and gentleness of technique with Manual lymphatic drainage. Discussed POC, LOS, treatment interventions,    PATIENT EDUCATION:  Education details: Educated pt in supine wand flexion and scaption and wall slides to restore normal Right shoulder ROM. Briefly discussed role of lymphatics and gentleness of technique with Manual lymphatic drainage. Discussed POC, LOS, treatment interventions, gave script for compression bra Person educated: Patient Education method:  Explanation, Demonstration, and Handouts  Education comprehension: verbalized understanding and returned demonstration  HOME EXERCISE PROGRAM: Supine wand flexion and scaption, standing wall slides for abduction.   ASSESSMENT:  CLINICAL IMPRESSION:  Continued to instruct pt in self MLD technique and had her return demo each step while therapist educated pt in importance of each step. Pt demonstrated good skin stretch and improved understanding of the sequence by the end of the session.   EVAL Patient is a 43 y.o. female who was seen today for physical therapy evaluation and treatment for complaints of right breast swelling/lymphedema which started in October 2025. She is s/p neoadjuvant chemotherapy and R breast lumpectomy with SLNB 0+/1 on 06/05/23. She completed radiation, and shortly after radiation developed some swelling in the right breast. It has improved some since starting back to her compression bra. She presents with left breast swelling with mild fibrosis and enlarged pores greatest medially. Incisions are well healed. Shoulder ROM on the right is mildly limited and she was given a HEP today to perform that will address the restricted areas. She was briefly educated in the lymphatic system today in preparation for manual lymphatic drainage techniques next visit.She will benefit from skilled PT to address deficits noted and return pt to PLOF.   OBJECTIVE IMPAIRMENTS: decreased knowledge of condition, increased edema, impaired flexibility, impaired UE functional use, and postural dysfunction.   ACTIVITY LIMITATIONS: lifting and reach over head  PARTICIPATION LIMITATIONS: none  PERSONAL FACTORS: 1-2 comorbidities: Right breast cancer s/p chemotherapy and radiation are also affecting patient's functional outcome.   REHAB POTENTIAL: Good  CLINICAL DECISION MAKING: Stable/uncomplicated  EVALUATION COMPLEXITY: Low  GOALS: Goals reviewed with patient? Yes  SHORT TERM GOALS=LONG  TERM GOALS: Target date: 01/30/2023  Pt will be independent with MLD to decrease right breast swelling Baseline: Goal status: INITIAL  2.  Pt will note decreased breast swelling by 50% Baseline:  Goal status: INITIAL  3.  Pt will be compliant with compression bra to decrease right breast lymphedema Baseline:  Goal status: INITIAL  4.  Breast complaints questionnaire will decrease by at least 10 points to demonstrate improvement in swelling Baseline:  Goal status: INITIAL    PLAN:  PT FREQUENCY: 2x/week  PT DURATION: 6 weeks  PLANNED INTERVENTIONS: 97164- PT Re-evaluation, 97110-Therapeutic exercises, 97530- Therapeutic activity, 97112- Neuromuscular re-education, 97535- Self Care, [id number]- Manual therapy, 97760- Orthotic Initial, and [phone]- Orthotic/Prosthetic subsequent  PLAN FOR NEXT SESSION:  pt interested in flexi but her insurance will change on 1/1 so can't submit until then, review MLD to right breast and start instructing pt, check ROM  [employer], PT 01/14/2024, 5:00 PM  "

## 2024-01-16 ENCOUNTER — Ambulatory Visit

## 2024-01-18 ENCOUNTER — Inpatient Hospital Stay: Admission: RE | Admit: 2024-01-18 | Source: Ambulatory Visit

## 2024-01-18 ENCOUNTER — Telehealth: Payer: Self-pay

## 2024-01-18 NOTE — Telephone Encounter (Signed)
 Returned pt message. LVM for pt to return call back when she receive the message. Pt lvm about lymphedema in right breast. New diagnosis.

## 2024-01-21 ENCOUNTER — Telehealth: Payer: Self-pay

## 2024-01-21 NOTE — Telephone Encounter (Signed)
 Pt called and states she needs to extend her intermittent FMLA d/t lymphedema.  She was given [facility] email. Verbalized thanks and understanding. No concerns at this time.

## 2024-01-22 ENCOUNTER — Ambulatory Visit: Attending: Hematology and Oncology | Admitting: Rehabilitation

## 2024-01-22 ENCOUNTER — Encounter: Payer: Self-pay | Admitting: Rehabilitation

## 2024-01-22 DIAGNOSIS — R293 Abnormal posture: Secondary | ICD-10-CM | POA: Diagnosis present

## 2024-01-22 DIAGNOSIS — Z483 Aftercare following surgery for neoplasm: Secondary | ICD-10-CM | POA: Diagnosis present

## 2024-01-22 DIAGNOSIS — N63 Unspecified lump in unspecified breast: Secondary | ICD-10-CM | POA: Diagnosis present

## 2024-01-22 DIAGNOSIS — Z171 Estrogen receptor negative status [ER-]: Secondary | ICD-10-CM | POA: Insufficient documentation

## 2024-01-22 DIAGNOSIS — I890 Lymphedema, not elsewhere classified: Secondary | ICD-10-CM | POA: Insufficient documentation

## 2024-01-22 DIAGNOSIS — C50411 Malignant neoplasm of upper-outer quadrant of right female breast: Secondary | ICD-10-CM | POA: Insufficient documentation

## 2024-01-22 NOTE — Therapy (Signed)
 " OUTPATIENT PHYSICAL THERAPY  UPPER EXTREMITY ONCOLOGY TREATMENT  Patient Name: Kara Webb MRN: [id number] DOB:1980/02/23, 44 y.o., female Today's Date: 01/22/2024  END OF SESSION:  PT End of Session - 01/22/24 1818     Visit Number 5    Number of Visits 12    Date for Recertification  01/30/24    PT Start Time 1600    PT Stop Time 1650    PT Time Calculation (min) 50 min    Activity Tolerance Patient tolerated treatment well    Behavior During Therapy [facility] for tasks assessed/performed            Past Medical History:  Diagnosis Date   Breast cancer (HCC)    BV (bacterial vaginosis)    Cold intolerance 01/16/2005   Fatigue 01/16/2005   H/O chlamydia infection 08/12/2010   H/O urinary frequency 11/16/2000   H/O vaginal discharge 11/17/2003   H/O varicella    Heart murmur    Born with murmur   History of radiation therapy    Right breast-07/05/23-08/20/23- Dr. Lynwood Nasuti   HSV-1 infection    Hx: UTI (urinary tract infection)    Pelvic pain    Vulvovaginal candidiasis 04/16/2005   Yeast infection    Past Surgical History:  Procedure Laterality Date   BREAST BIOPSY Right 12/18/2022   US  RT BREAST BX W LOC DEV 1ST LESION IMG BX SPEC US  GUIDE 12/18/2022 [facility]   BREAST BIOPSY  06/01/2023   US  RT RADIOACTIVE SEED LOC 06/01/2023 [facility]   BREAST BIOPSY  06/01/2023   MM RT RADIOACTIVE SEED LOC MAMMO GUIDE 06/01/2023 [facility]   BREAST BIOPSY  06/01/2023   MM RT RADIOACTIVE SEED EA ADD LESION LOC MAMMO GUIDE 06/01/2023 [facility]   BREAST LUMPECTOMY WITH RADIOACTIVE SEED AND SENTINEL LYMPH NODE BIOPSY Right 06/05/2023   Procedure: RIGHT BREAST SEED LUMPECTOMY x2, RIGHT SEED TARGETED LYMPH NODE BIOPSY, RIGHT SENTINEL LYMPH NODE MAPPING;  Surgeon: Vanderbilt Ned, MD;  Location: [facility];  Service: General;  Laterality: Right;  RIGHT BREAST SEED LUMPECTOMY x2, RIGHT SEED TARGETED LYMPH NODE BIOPSY, RIGHT SENTINEL LYMPH NODE  MAPPING 90 MIN   IR CV LINE INJECTION  04/13/2023   PORT-A-CATH REMOVAL Right 12/06/2023   Procedure: REMOVAL PORT-A-CATH;  Surgeon: Vanderbilt Ned, MD;  Location: [facility];  Service: General;  Laterality: Right;   PORTACATH PLACEMENT N/A 01/03/2023   Procedure: INSERTION PORT-A-CATH WITH ULTRASOUND ANDREA;  Surgeon: Vanderbilt Ned, MD;  Location: [facility];  Service: General;  Laterality: N/A;   TOE SURGERY  2004   WISDOM TOOTH EXTRACTION  2003   Patient Active Problem List   Diagnosis Date Noted   Port-A-Cath in place 01/12/2023   Genetic testing 01/12/2023   Family history of breast cancer 12/27/2022   Family history of colon cancer 12/27/2022   Malignant neoplasm of upper-outer quadrant of right breast in female, estrogen receptor negative (HCC) 12/25/2022    PCP:   REFERRING PROVIDER: Mackey Chad, MD  REFERRING DIAG: Right breast Lymphedema  THERAPY DIAG:  Lymphedema, not elsewhere classified  Aftercare following surgery for neoplasm  Breast swelling  Malignant neoplasm of upper-outer quadrant of right breast in female, estrogen receptor negative (HCC)  Abnormal posture  ONSET DATE: October 2025  Rationale for Evaluation and Treatment: Rehabilitation  SUBJECTIVE:  SUBJECTIVE STATEMENT:  I don't like the self massage. I don't feel like I am doing it right.   EVAL Pt noticed that her breast skin looks like orange peel. No soreness in breast. It feels tighter in the right breast. I started with my compression bra last week. She sees a print of her bra on her breast. Orange peel has improved some since wearing compression.  I feel a little limited in my shoulder reaching into the back seat.  PERTINENT HISTORY:   Patient was diagnosed on 12/12/2022  with right grade 2 invasive ductal carcinoma breast cancer. It measures 1.3 cm and is located in the upper outer quadrant. It is ER/PR negative and HER2 positive with a Ki67 of 30%. She had a positive axillary lymph node. Completed neoadjuvant chemo. Underwent R breast lumpectomy and SLNB 0/1 on 06/05/23.  PAIN:   Are you having pain? No  PRECAUTIONS: Right UE lymphedema risk  RED FLAGS: None   WEIGHT BEARING RESTRICTIONS: No  FALLS:  Has patient fallen in last 6 months? No  LIVING ENVIRONMENT: Lives with: lives with their spouse, and son who is in college at [employer] Lives in: House/apartment   OCCUPATION: Adjustor for an insurance company   LEISURE: spend time with family and friends, cooking, phone,  HAND DOMINANCE: right   PRIOR LEVEL OF FUNCTION: Independent  PATIENT GOALS: Decrease breast swelling   OBJECTIVE: Note: Objective measures were completed at Evaluation unless otherwise noted.  COGNITION: Overall cognitive status: Within functional limits for tasks assessed   PALPATION: Mild tenderness at lateral breast incision,  OBSERVATIONS / OTHER ASSESSMENTS: mildly enlarged pores and orange peel greatest at medial breast, mild fibrosis medial breast with tenderness  SENSATION: Light touch: Deficits     POSTURE: forward head, rounded shoulders  UPPER EXTREMITY AROM/PROM:  A/PROM RIGHT   eval  RIGHT 01/07/2024  Shoulder extension 60   Shoulder flexion 153 158  Shoulder abduction 149 171  Shoulder internal rotation 70   Shoulder external rotation 106     (Blank rows = not tested)  A/PROM LEFT   eval  Shoulder extension 57  Shoulder flexion 163  Shoulder abduction 175  Shoulder internal rotation 68  Shoulder external rotation 103    (Blank rows = not tested)  CERVICAL AROM: All within fxl limits:     UPPER EXTREMITY STRENGTH:   LYMPHEDEMA ASSESSMENTS:   SURGERY TYPE/DATE: R breast lumpectomy and SLNB 0/1 on 06/05/23.  NUMBER OF LYMPH NODES  REMOVED: 0/1  CHEMOTHERAPY: YES  RADIATION:YES  HORMONE TREATMENT: NO  INFECTIONS: NO   LYMPHEDEMA ASSESSMENTS:   LANDMARK RIGHT  eval  At axilla    15 cm proximal to the proximal aspect of the olecranon process   10 cm proximal to the proximal aspect of the olecranon process   Olecranon process   15 cm proximal to the proximal aspect of the ulnar styloid process   10 cm proximal to the proximal aspect of the ulnar styloid process   Just distal to the ulnar styloid process   Across hand at thumb web space   At base of 2nd digit   (Blank rows = not tested)  LANDMARK LEFT  eval  At axilla    15 cm proximal to the proximal aspect of the olecranon process   10 cm proximal to the proximal aspect of the olecranon process   Olecranon process   15 cm proximal to the proximal aspect of the ulnar styloid process   10  cm proximal to  the proximal aspect of the ulnar styloid process   Just distal to the ulnar styloid process   Across hand at thumb web space   At base of 2nd digit   (Blank rows = not tested)  Chest circumference just inferior to the axillae: 119.5 Chest circumference at the largest point:  132 cm   BREAST COMPLAINTS QUESTIONNAIRE Pain:0  Heaviness:0 Swollen feeling:5 Tense Skin:5 Redness:0 Bra Print:5 Size of Pores:8 Hard feeling: 1 Total:    24 /80 A Score over 9 indicates lymphedema issues in the breast                                                                                                                             TREATMENT DATE:  Pt permission and consent throughout each step of examination and treatment with modification and draping if requested when working on sensitive areas.  Pt is aware that a clinic chaperone is not available.   01/14/2024 MLD: In supine: Short neck, superficial and deep abdominals, bil axillary nodes and establishment of interaxillary pathway, R inguinal nodes and establishment of axilloinguinal pathway, then R  breast moving fluid towards pathways spending extra time in any areas of fibrosis then posterior interaxillary work in sidelying and then retracing all steps and ending with LN's.   01/09/2024 Pt instructed in MLD: all techniques performed first by PT and then by pt modified for pt position. In supine: Short neck, 5 diaphragmatic breaths, Left  and Right axillary nodes and establishment of interaxillary pathway, R inguinal nodes and establishment of axilloinguinal pathway, then R breast moving fluid towards pathways spending extra time in any areas of fibrosis then retracing all steps and ending with LN's. Alos tried right axillo-inguinal pathway in SL which she preferred. Discussed Flexi touch briefly and gave pt flyer.    01/07/2024 Pulleys x 2 min ea flexion and abduction to improve ROM Single arm doorway stretch x 3, 30 sec Measured AROM Right shoulder with good improvement esp with abduction Reminded pt using poster about superficial nature of lymphatics and the reason for light stretch. Pt instructed in MLD: all techniques performed first by PT and then by pt modified for pt position. In supine: Short neck, 5 diaphragmatic breaths, Left  and Right axillary nodes and establishment of interaxillary pathway, R inguinal nodes and establishment of axilloinguinal pathway, then R breast moving fluid towards pathways spending extra time in any areas of fibrosis then retracing all steps and ending with LN's.  Pt was given illustrated and written handout.  12/19/2023 Educated pt in supine wand flexion and scaption and wall slides to restore normal Right shoulder ROM. Briefly discussed role of lymphatics and gentleness of technique with Manual lymphatic drainage. Discussed POC, LOS, treatment interventions,    PATIENT EDUCATION:  Education details: Educated pt in supine wand flexion and scaption and wall slides to restore normal Right shoulder ROM. Briefly discussed role of lymphatics and gentleness  of technique  with Manual lymphatic drainage. Discussed POC, LOS, treatment interventions, gave script for compression bra Person educated: Patient Education method: Explanation, Demonstration, and Handouts Education comprehension: verbalized understanding and returned demonstration  HOME EXERCISE PROGRAM: Supine wand flexion and scaption, standing wall slides for abduction.   ASSESSMENT:  CLINICAL IMPRESSION: Continued MLD for the Rt breast with good softening especially medially.   EVAL Patient is a 44 y.o. female who was seen today for physical therapy evaluation and treatment for complaints of right breast swelling/lymphedema which started in October 2025. She is s/p neoadjuvant chemotherapy and R breast lumpectomy with SLNB 0+/1 on 06/05/23. She completed radiation, and shortly after radiation developed some swelling in the right breast. It has improved some since starting back to her compression bra. She presents with left breast swelling with mild fibrosis and enlarged pores greatest medially. Incisions are well healed. Shoulder ROM on the right is mildly limited and she was given a HEP today to perform that will address the restricted areas. She was briefly educated in the lymphatic system today in preparation for manual lymphatic drainage techniques next visit.She will benefit from skilled PT to address deficits noted and return pt to PLOF.   OBJECTIVE IMPAIRMENTS: decreased knowledge of condition, increased edema, impaired flexibility, impaired UE functional use, and postural dysfunction.   ACTIVITY LIMITATIONS: lifting and reach over head  PARTICIPATION LIMITATIONS: none  PERSONAL FACTORS: 1-2 comorbidities: Right breast cancer s/p chemotherapy and radiation are also affecting patient's functional outcome.   REHAB POTENTIAL: Good  CLINICAL DECISION MAKING: Stable/uncomplicated  EVALUATION COMPLEXITY: Low  GOALS: Goals reviewed with patient? Yes  SHORT TERM GOALS=LONG TERM  GOALS: Target date: 01/30/2023  Pt will be independent with MLD to decrease right breast swelling Baseline: Goal status: INITIAL  2.  Pt will note decreased breast swelling by 50% Baseline:  Goal status: INITIAL  3.  Pt will be compliant with compression bra to decrease right breast lymphedema Baseline:  Goal status: INITIAL  4.  Breast complaints questionnaire will decrease by at least 10 points to demonstrate improvement in swelling Baseline:  Goal status: INITIAL    PLAN:  PT FREQUENCY: 2x/week  PT DURATION: 6 weeks  PLANNED INTERVENTIONS: 97164- PT Re-evaluation, 97110-Therapeutic exercises, 97530- Therapeutic activity, 97112- Neuromuscular re-education, 97535- Self Care, [id number]- Manual therapy, 97760- Orthotic Initial, and [phone]- Orthotic/Prosthetic subsequent  PLAN FOR NEXT SESSION:  pt interested in flexi but her insurance will change on 1/1 so can't submit until then, review MLD to right breast and start instructing pt, check ROM  Kellen Hover, Saddie SAUNDERS, PT 01/22/2024, 6:20 PM  "

## 2024-01-24 ENCOUNTER — Ambulatory Visit: Admitting: Rehabilitation

## 2024-01-24 DIAGNOSIS — R293 Abnormal posture: Secondary | ICD-10-CM

## 2024-01-24 DIAGNOSIS — C50411 Malignant neoplasm of upper-outer quadrant of right female breast: Secondary | ICD-10-CM

## 2024-01-24 DIAGNOSIS — I890 Lymphedema, not elsewhere classified: Secondary | ICD-10-CM | POA: Diagnosis not present

## 2024-01-24 DIAGNOSIS — Z483 Aftercare following surgery for neoplasm: Secondary | ICD-10-CM

## 2024-01-24 DIAGNOSIS — N63 Unspecified lump in unspecified breast: Secondary | ICD-10-CM

## 2024-01-24 NOTE — Therapy (Signed)
 " OUTPATIENT PHYSICAL THERAPY  UPPER EXTREMITY ONCOLOGY TREATMENT  Patient Name: Kara Webb MRN: [id number] DOB:[birth year], 44 y.o., female Today's Date: 01/25/2024  END OF SESSION:  PT End of Session - 01/25/24 0855     Visit Number 6    Number of Visits 12    Date for Recertification  01/30/24    PT Start Time 0800    PT Stop Time 0846    PT Time Calculation (min) 46 min    Activity Tolerance Patient tolerated treatment well    Behavior During Therapy [facility] for tasks assessed/performed             Past Medical History:  Diagnosis Date   Breast cancer (HCC)    BV (bacterial vaginosis)    Cold intolerance 01/16/2005   Fatigue 01/16/2005   H/O chlamydia infection 08/12/2010   H/O urinary frequency 11/16/2000   H/O vaginal discharge 11/17/2003   H/O varicella    Heart murmur    Born with murmur   History of radiation therapy    Right breast-07/05/23-08/20/23- Dr. Lynwood Nasuti   HSV-1 infection    Hx: UTI (urinary tract infection)    Pelvic pain    Vulvovaginal candidiasis 04/16/2005   Yeast infection    Past Surgical History:  Procedure Laterality Date   BREAST BIOPSY Right 12/18/2022   US  RT BREAST BX W LOC DEV 1ST LESION IMG BX SPEC US  GUIDE 12/18/2022 [facility]   BREAST BIOPSY  06/01/2023   US  RT RADIOACTIVE SEED LOC 06/01/2023 [facility]   BREAST BIOPSY  06/01/2023   MM RT RADIOACTIVE SEED LOC MAMMO GUIDE 06/01/2023 [facility]   BREAST BIOPSY  06/01/2023   MM RT RADIOACTIVE SEED EA ADD LESION LOC MAMMO GUIDE 06/01/2023 [facility]   BREAST LUMPECTOMY WITH RADIOACTIVE SEED AND SENTINEL LYMPH NODE BIOPSY Right 06/05/2023   Procedure: RIGHT BREAST SEED LUMPECTOMY x2, RIGHT SEED TARGETED LYMPH NODE BIOPSY, RIGHT SENTINEL LYMPH NODE MAPPING;  Surgeon: Vanderbilt Ned, MD;  Location: [facility];  Service: General;  Laterality: Right;  RIGHT BREAST SEED LUMPECTOMY x2, RIGHT SEED TARGETED LYMPH NODE BIOPSY, RIGHT SENTINEL LYMPH  NODE MAPPING 90 MIN   IR CV LINE INJECTION  04/13/2023   PORT-A-CATH REMOVAL Right 12/06/2023   Procedure: REMOVAL PORT-A-CATH;  Surgeon: Vanderbilt Ned, MD;  Location: [facility];  Service: General;  Laterality: Right;   PORTACATH PLACEMENT N/A 01/03/2023   Procedure: INSERTION PORT-A-CATH WITH ULTRASOUND ANDREA;  Surgeon: Vanderbilt Ned, MD;  Location: [facility];  Service: General;  Laterality: N/A;   TOE SURGERY  2004   WISDOM TOOTH EXTRACTION  2003   Patient Active Problem List   Diagnosis Date Noted   Port-A-Cath in place 01/12/2023   Genetic testing 01/12/2023   Family history of breast cancer 12/27/2022   Family history of colon cancer 12/27/2022   Malignant neoplasm of upper-outer quadrant of right breast in female, estrogen receptor negative (HCC) 12/25/2022    PCP:   REFERRING PROVIDER: Mackey Chad, MD  REFERRING DIAG: Right breast Lymphedema  THERAPY DIAG:  Lymphedema, not elsewhere classified  Aftercare following surgery for neoplasm  Breast swelling  Malignant neoplasm of upper-outer quadrant of right breast in female, estrogen receptor negative (HCC)  Abnormal posture  ONSET DATE: October 2025  Rationale for Evaluation and Treatment: Rehabilitation  SUBJECTIVE:  SUBJECTIVE STATEMENT:  Nothing hurts right now  EVAL Pt noticed that her breast skin looks like orange peel. No soreness in breast. It feels tighter in the right breast. I started with my compression bra last week. She sees a print of her bra on her breast. Orange peel has improved some since wearing compression.  I feel a little limited in my shoulder reaching into the back seat.  PERTINENT HISTORY:   Patient was diagnosed on 12/12/2022 with right grade 2 invasive ductal  carcinoma breast cancer. It measures 1.3 cm and is located in the upper outer quadrant. It is ER/PR negative and HER2 positive with a Ki67 of 30%. She had a positive axillary lymph node. Completed neoadjuvant chemo. Underwent R breast lumpectomy and SLNB 0/1 on 06/05/23.  PAIN:   Are you having pain? No  PRECAUTIONS: Right UE lymphedema risk  RED FLAGS: None   WEIGHT BEARING RESTRICTIONS: No  FALLS:  Has patient fallen in last 6 months? No  LIVING ENVIRONMENT: Lives with: lives with their spouse, and son who is in college at [employer] Lives in: House/apartment   OCCUPATION: Adjustor for an insurance company   LEISURE: spend time with family and friends, cooking, phone,  HAND DOMINANCE: right   PRIOR LEVEL OF FUNCTION: Independent  PATIENT GOALS: Decrease breast swelling   OBJECTIVE: Note: Objective measures were completed at Evaluation unless otherwise noted.  COGNITION: Overall cognitive status: Within functional limits for tasks assessed   PALPATION: Mild tenderness at lateral breast incision,  OBSERVATIONS / OTHER ASSESSMENTS: mildly enlarged pores and orange peel greatest at medial breast, mild fibrosis medial breast with tenderness  SENSATION: Light touch: Deficits     POSTURE: forward head, rounded shoulders  UPPER EXTREMITY AROM/PROM:  A/PROM RIGHT   eval  RIGHT 01/07/2024  Shoulder extension 60   Shoulder flexion 153 158  Shoulder abduction 149 171  Shoulder internal rotation 70   Shoulder external rotation 106     (Blank rows = not tested)  A/PROM LEFT   eval  Shoulder extension 57  Shoulder flexion 163  Shoulder abduction 175  Shoulder internal rotation 68  Shoulder external rotation 103    (Blank rows = not tested)  CERVICAL AROM: All within fxl limits:     UPPER EXTREMITY STRENGTH:   LYMPHEDEMA ASSESSMENTS:   SURGERY TYPE/DATE: R breast lumpectomy and SLNB 0/1 on 06/05/23.  NUMBER OF LYMPH NODES REMOVED: 0/1  CHEMOTHERAPY:  YES  RADIATION:YES  HORMONE TREATMENT: NO  INFECTIONS: NO   LYMPHEDEMA ASSESSMENTS:   LANDMARK RIGHT  eval  At axilla    15 cm proximal to the proximal aspect of the olecranon process   10 cm proximal to the proximal aspect of the olecranon process   Olecranon process   15 cm proximal to the proximal aspect of the ulnar styloid process   10 cm proximal to the proximal aspect of the ulnar styloid process   Just distal to the ulnar styloid process   Across hand at thumb web space   At base of 2nd digit   (Blank rows = not tested)  LANDMARK LEFT  eval  At axilla    15 cm proximal to the proximal aspect of the olecranon process   10 cm proximal to the proximal aspect of the olecranon process   Olecranon process   15 cm proximal to the proximal aspect of the ulnar styloid process   10 cm proximal to  the proximal aspect of the ulnar styloid process  Just distal to the ulnar styloid process   Across hand at thumb web space   At base of 2nd digit   (Blank rows = not tested)  Chest circumference just inferior to the axillae: 119.5 Chest circumference at the largest point:  132 cm   BREAST COMPLAINTS QUESTIONNAIRE Pain:0  Heaviness:0 Swollen feeling:5 Tense Skin:5 Redness:0 Bra Print:5 Size of Pores:8 Hard feeling: 1 Total:    24 /80 A Score over 9 indicates lymphedema issues in the breast                                                                                                                             TREATMENT DATE:  Pt permission and consent throughout each step of examination and treatment with modification and draping if requested when working on sensitive areas.  Pt is aware that a clinic chaperone is not available.   01/25/2023 MLD: In supine: Short neck, superficial and deep abdominals, bil axillary nodes and establishment of interaxillary pathway, R inguinal nodes and establishment of axilloinguinal pathway, then R breast moving fluid towards  pathways spending extra time in any areas of fibrosis then retracing all steps and ending with LN's.   01/14/2024 MLD: In supine: Short neck, superficial and deep abdominals, bil axillary nodes and establishment of interaxillary pathway, R inguinal nodes and establishment of axilloinguinal pathway, then R breast moving fluid towards pathways spending extra time in any areas of fibrosis then posterior interaxillary work in sidelying and then retracing all steps and ending with LN's.   01/09/2024 Pt instructed in MLD: all techniques performed first by PT and then by pt modified for pt position. In supine: Short neck, 5 diaphragmatic breaths, Left  and Right axillary nodes and establishment of interaxillary pathway, R inguinal nodes and establishment of axilloinguinal pathway, then R breast moving fluid towards pathways spending extra time in any areas of fibrosis then retracing all steps and ending with LN's. Alos tried right axillo-inguinal pathway in SL which she preferred. Discussed Flexi touch briefly and gave pt flyer.   PATIENT EDUCATION:  Education details: Educated pt in supine wand flexion and scaption and wall slides to restore normal Right shoulder ROM. Briefly discussed role of lymphatics and gentleness of technique with Manual lymphatic drainage. Discussed POC, LOS, treatment interventions, gave script for compression bra Person educated: Patient Education method: Explanation, Demonstration, and Handouts Education comprehension: verbalized understanding and returned demonstration  HOME EXERCISE PROGRAM: Supine wand flexion and scaption, standing wall slides for abduction.   ASSESSMENT:  CLINICAL IMPRESSION: Continued MLD for the Rt breast with good softening especially medially.   EVAL Patient is a 44 y.o. female who was seen today for physical therapy evaluation and treatment for complaints of right breast swelling/lymphedema which started in October 2025. She is s/p neoadjuvant  chemotherapy and R breast lumpectomy with SLNB 0+/1 on 06/05/23. She completed radiation, and shortly after radiation developed some swelling in the right breast. It has improved some since  starting back to her compression bra. She presents with left breast swelling with mild fibrosis and enlarged pores greatest medially. Incisions are well healed. Shoulder ROM on the right is mildly limited and she was given a HEP today to perform that will address the restricted areas. She was briefly educated in the lymphatic system today in preparation for manual lymphatic drainage techniques next visit.She will benefit from skilled PT to address deficits noted and return pt to PLOF.   OBJECTIVE IMPAIRMENTS: decreased knowledge of condition, increased edema, impaired flexibility, impaired UE functional use, and postural dysfunction.   ACTIVITY LIMITATIONS: lifting and reach over head  PARTICIPATION LIMITATIONS: none  PERSONAL FACTORS: 1-2 comorbidities: Right breast cancer s/p chemotherapy and radiation are also affecting patient's functional outcome.   REHAB POTENTIAL: Good  CLINICAL DECISION MAKING: Stable/uncomplicated  EVALUATION COMPLEXITY: Low  GOALS: Goals reviewed with patient? Yes  SHORT TERM GOALS=LONG TERM GOALS: Target date: 01/30/2023  Pt will be independent with MLD to decrease right breast swelling Baseline: Goal status: INITIAL  2.  Pt will note decreased breast swelling by 50% Baseline:  Goal status: INITIAL  3.  Pt will be compliant with compression bra to decrease right breast lymphedema Baseline:  Goal status: INITIAL  4.  Breast complaints questionnaire will decrease by at least 10 points to demonstrate improvement in swelling Baseline:  Goal status: INITIAL    PLAN:  PT FREQUENCY: 2x/week  PT DURATION: 6 weeks  PLANNED INTERVENTIONS: 97164- PT Re-evaluation, 97110-Therapeutic exercises, 97530- Therapeutic activity, 97112- Neuromuscular re-education, 97535- Self  Care, 02859- Manual therapy, [phone]- Orthotic Initial, and [phone]- Orthotic/Prosthetic subsequent  PLAN FOR NEXT SESSION:  pt interested in flexi but her insurance will change on 1/1 so can't submit until then, review MLD to right breast and start instructing pt, check ROM  Nohemi Nicklaus, Saddie SAUNDERS, PT 01/25/2024, 8:55 AM   DIAGNOSIS ASSESSMENT [x]  Lymphedema, not elsewhere classified [I89.0]  Secondary to:  []  Post-mastectomy lymphedema [I97.2]   []  Hereditary/Congenital lymphedema [Q82.0]  []  Praecox []  Tarda []  Other:   LOCATION OF LYMPHEDEMA []   Right Arm  []  Left Arm  [x]  Chest/Breast  []  Abdomen/Trunk []  Other: Date of onset, or duration: 11/17/2023  SEVERITY Symptoms and observations from physical exam Stage of Lymphedema:  [x] Stage II  []  Stage III  [] Other: [x]  Hyperkeratosis [x]  Hyperpigmentation []  Lymphorrhea (weeping) []  Papillomatosis (warts, nodules, papules)  []  Elephantiasis  [x]  Fibrosis and/or radiation fibrosis []  Pitting edema []  Recurrent episode(s) of infection/cellulitis [x]  Pain [x]  Peau d'orange [x]  Chest and/or axillary swelling caused by: []  History of chest/breast/abdominal surgery(ies) [x]  Lymph node dissection, radiation and or chemotherapy [x]  Truncal and/or abdominal swelling []  Axillary cording []  Other, explain:  CONSERVATIVE TREATMENT (CT) INITATION Start date (if different from this visit date):  Conservative treatment plan includes the following activities (check all that apply): [x]  Regular exercise  [x]  Elevation of the limb(s) [x]  Compression garment(s) or compression bandage system  []  Compression garments with a minimum of             mmHg distally  [x]  Compression bra and/or tank top []  Compression bandage system  Type: []  Multi-layer short stretch []  Velcro reduction kit    []  Two- or three-layer prepackaged compression kit   []  Other:  []  Complete decongestive therapy (CDT) or lymphedema therapy   [x]  Minimum of 30 minutes daily MLD  or self-MLD  []  Diet assessment with modification, if applicable. []  For patients with congestive heart failure (CHF) a medication assessment with modification(s), if  applicable. []  Other:   Has the patient tried an [id number] basic pneumatic compression device? []  Yes  [x]  No Comments:   MEASUREMENTS Choose at least two anatomical locations.  []   Right Arm  01/24/24  []   Left Arm    Wrist                     cm  Wrist                     cm  Forearm                     cm  Forearm                     cm  Biceps                     cm  Biceps                     cm  Axilla (Chest)*  122                cm  Axilla (Chest)*                     cm  Umbilicus (Waist)*         125            cm  Umbilicus (Waist)*                     cm  *Include measurements if patient presents with lymphedema proximally into the chest or trunk   Other history and clinical information:  "

## 2024-01-25 ENCOUNTER — Ambulatory Visit
Admission: RE | Admit: 2024-01-25 | Discharge: 2024-01-25 | Disposition: A | Source: Ambulatory Visit | Attending: Hematology and Oncology

## 2024-01-25 DIAGNOSIS — C50411 Malignant neoplasm of upper-outer quadrant of right female breast: Secondary | ICD-10-CM

## 2024-01-29 ENCOUNTER — Ambulatory Visit: Admitting: Rehabilitation

## 2024-01-29 ENCOUNTER — Encounter: Payer: Self-pay | Admitting: Rehabilitation

## 2024-01-29 DIAGNOSIS — Z483 Aftercare following surgery for neoplasm: Secondary | ICD-10-CM

## 2024-01-29 DIAGNOSIS — I890 Lymphedema, not elsewhere classified: Secondary | ICD-10-CM

## 2024-01-29 DIAGNOSIS — R293 Abnormal posture: Secondary | ICD-10-CM

## 2024-01-29 DIAGNOSIS — N63 Unspecified lump in unspecified breast: Secondary | ICD-10-CM

## 2024-01-29 DIAGNOSIS — C50411 Malignant neoplasm of upper-outer quadrant of right female breast: Secondary | ICD-10-CM

## 2024-01-29 NOTE — Therapy (Unsigned)
 " OUTPATIENT PHYSICAL THERAPY  UPPER EXTREMITY ONCOLOGY TREATMENT  Patient Name: Kara Webb MRN: [id number] DOB:09/28/1980, 44 y.o., female Today's Date: 01/30/2024  END OF SESSION:  PT End of Session - 01/29/24 1204     Visit Number 7    Number of Visits 12    Date for Recertification  02/21/24    PT Start Time 1205    PT Stop Time 1250    PT Time Calculation (min) 45 min    Activity Tolerance Patient tolerated treatment well    Behavior During Therapy [facility] for tasks assessed/performed             Past Medical History:  Diagnosis Date   Breast cancer (HCC)    BV (bacterial vaginosis)    Cold intolerance 01/16/2005   Fatigue 01/16/2005   H/O chlamydia infection 08/12/2010   H/O urinary frequency 11/16/2000   H/O vaginal discharge 11/17/2003   H/O varicella    Heart murmur    Born with murmur   History of radiation therapy    Right breast-07/05/23-08/20/23- Dr. Lynwood Nasuti   HSV-1 infection    Hx: UTI (urinary tract infection)    Pelvic pain    Vulvovaginal candidiasis 04/16/2005   Yeast infection    Past Surgical History:  Procedure Laterality Date   BREAST BIOPSY Right 12/18/2022   US  RT BREAST BX W LOC DEV 1ST LESION IMG BX SPEC US  GUIDE 12/18/2022 [facility]   BREAST BIOPSY  06/01/2023   US  RT RADIOACTIVE SEED LOC 06/01/2023 [facility]   BREAST BIOPSY  06/01/2023   MM RT RADIOACTIVE SEED LOC MAMMO GUIDE 06/01/2023 [facility]   BREAST BIOPSY  06/01/2023   MM RT RADIOACTIVE SEED EA ADD LESION LOC MAMMO GUIDE 06/01/2023 [facility]   BREAST LUMPECTOMY WITH RADIOACTIVE SEED AND SENTINEL LYMPH NODE BIOPSY Right 06/05/2023   Procedure: RIGHT BREAST SEED LUMPECTOMY x2, RIGHT SEED TARGETED LYMPH NODE BIOPSY, RIGHT SENTINEL LYMPH NODE MAPPING;  Surgeon: Vanderbilt Ned, MD;  Location: [facility];  Service: General;  Laterality: Right;  RIGHT BREAST SEED LUMPECTOMY x2, RIGHT SEED TARGETED LYMPH NODE BIOPSY, RIGHT SENTINEL LYMPH  NODE MAPPING 90 MIN   IR CV LINE INJECTION  04/13/2023   PORT-A-CATH REMOVAL Right 12/06/2023   Procedure: REMOVAL PORT-A-CATH;  Surgeon: Vanderbilt Ned, MD;  Location: [facility];  Service: General;  Laterality: Right;   PORTACATH PLACEMENT N/A 01/03/2023   Procedure: INSERTION PORT-A-CATH WITH ULTRASOUND ANDREA;  Surgeon: Vanderbilt Ned, MD;  Location: [facility];  Service: General;  Laterality: N/A;   TOE SURGERY  2004   WISDOM TOOTH EXTRACTION  2003   Patient Active Problem List   Diagnosis Date Noted   Port-A-Cath in place 01/12/2023   Genetic testing 01/12/2023   Family history of breast cancer 12/27/2022   Family history of colon cancer 12/27/2022   Malignant neoplasm of upper-outer quadrant of right breast in female, estrogen receptor negative (HCC) 12/25/2022    PCP:   REFERRING PROVIDER: Mackey Chad, MD  REFERRING DIAG: Right breast Lymphedema  THERAPY DIAG:  Lymphedema, not elsewhere classified  Aftercare following surgery for neoplasm  Breast swelling  Malignant neoplasm of upper-outer quadrant of right breast in female, estrogen receptor negative (HCC)  Abnormal posture  ONSET DATE: October 2025  Rationale for Evaluation and Treatment: Rehabilitation  SUBJECTIVE:  SUBJECTIVE STATEMENT:  Nothing hurts right now  EVAL Pt noticed that her breast skin looks like orange peel. No soreness in breast. It feels tighter in the right breast. I started with my compression bra last week. She sees a print of her bra on her breast. Orange peel has improved some since wearing compression.  I feel a little limited in my shoulder reaching into the back seat.  PERTINENT HISTORY:   Patient was diagnosed on 12/12/2022 with right grade 2 invasive ductal  carcinoma breast cancer. It measures 1.3 cm and is located in the upper outer quadrant. It is ER/PR negative and HER2 positive with a Ki67 of 30%. She had a positive axillary lymph node. Completed neoadjuvant chemo. Underwent R breast lumpectomy and SLNB 0/1 on 06/05/23.  PAIN:   Are you having pain? No  PRECAUTIONS: Right UE lymphedema risk  RED FLAGS: None   WEIGHT BEARING RESTRICTIONS: No  FALLS:  Has patient fallen in last 6 months? No  LIVING ENVIRONMENT: Lives with: lives with their spouse, and son who is in college at [employer] Lives in: House/apartment   OCCUPATION: Adjustor for an insurance company   LEISURE: spend time with family and friends, cooking, phone,  HAND DOMINANCE: right   PRIOR LEVEL OF FUNCTION: Independent  PATIENT GOALS: Decrease breast swelling   OBJECTIVE: Note: Objective measures were completed at Evaluation unless otherwise noted.  COGNITION: Overall cognitive status: Within functional limits for tasks assessed   PALPATION: Mild tenderness at lateral breast incision,  OBSERVATIONS / OTHER ASSESSMENTS: mildly enlarged pores and orange peel greatest at medial breast, mild fibrosis medial breast with tenderness  SENSATION: Light touch: Deficits     POSTURE: forward head, rounded shoulders  UPPER EXTREMITY AROM/PROM:  A/PROM RIGHT   eval  RIGHT 01/07/2024  Shoulder extension 60   Shoulder flexion 153 158  Shoulder abduction 149 171  Shoulder internal rotation 70   Shoulder external rotation 106     (Blank rows = not tested)  A/PROM LEFT   eval  Shoulder extension 57  Shoulder flexion 163  Shoulder abduction 175  Shoulder internal rotation 68  Shoulder external rotation 103    (Blank rows = not tested)  CERVICAL AROM: All within fxl limits:     UPPER EXTREMITY STRENGTH:   LYMPHEDEMA ASSESSMENTS:   SURGERY TYPE/DATE: R breast lumpectomy and SLNB 0/1 on 06/05/23.  NUMBER OF LYMPH NODES REMOVED: 0/1  CHEMOTHERAPY:  YES  RADIATION:YES  HORMONE TREATMENT: NO  INFECTIONS: NO   LYMPHEDEMA ASSESSMENTS:   LANDMARK RIGHT  eval  At axilla    15 cm proximal to the proximal aspect of the olecranon process   10 cm proximal to the proximal aspect of the olecranon process   Olecranon process   15 cm proximal to the proximal aspect of the ulnar styloid process   10 cm proximal to the proximal aspect of the ulnar styloid process   Just distal to the ulnar styloid process   Across hand at thumb web space   At base of 2nd digit   (Blank rows = not tested)  LANDMARK LEFT  eval  At axilla    15 cm proximal to the proximal aspect of the olecranon process   10 cm proximal to the proximal aspect of the olecranon process   Olecranon process   15 cm proximal to the proximal aspect of the ulnar styloid process   10 cm proximal to  the proximal aspect of the ulnar styloid process  Just distal to the ulnar styloid process   Across hand at thumb web space   At base of 2nd digit   (Blank rows = not tested)  Chest circumference just inferior to the axillae: 119.5 Chest circumference at the largest point:  132 cm   BREAST COMPLAINTS QUESTIONNAIRE   EVAL   01/29/24 Pain:  0   1 Heaviness: 0   1 Swollen feeling:5   5 Tense Skin: 5   0 Redness: 0   0 Bra Print: 5   5 Size of Pores: 8   2 Hard feeling:  1   1 Total:     24 /80   15/80 A Score over 9 indicates lymphedema issues in the breast                                                                                                                             TREATMENT DATE:  Pt permission and consent throughout each step of examination and treatment with modification and draping if requested when working on sensitive areas.  Pt is aware that a clinic chaperone is not available.   01/29/2023 MLD: In supine: Short neck, superficial and deep abdominals, bil axillary nodes and establishment of interaxillary pathway, R inguinal nodes and establishment  of axilloinguinal pathway, then R breast moving fluid towards pathways spending extra time in any areas of fibrosis then retracing all steps and ending with LN's.   01/25/2023 MLD: In supine: Short neck, superficial and deep abdominals, bil axillary nodes and establishment of interaxillary pathway, R inguinal nodes and establishment of axilloinguinal pathway, then R breast moving fluid towards pathways spending extra time in any areas of fibrosis then retracing all steps and ending with LN's.   01/14/2024 MLD: In supine: Short neck, superficial and deep abdominals, bil axillary nodes and establishment of interaxillary pathway, R inguinal nodes and establishment of axilloinguinal pathway, then R breast moving fluid towards pathways spending extra time in any areas of fibrosis then posterior interaxillary work in sidelying and then retracing all steps and ending with LN's.   01/09/2024 Pt instructed in MLD: all techniques performed first by PT and then by pt modified for pt position. In supine: Short neck, 5 diaphragmatic breaths, Left  and Right axillary nodes and establishment of interaxillary pathway, R inguinal nodes and establishment of axilloinguinal pathway, then R breast moving fluid towards pathways spending extra time in any areas of fibrosis then retracing all steps and ending with LN's. Alos tried right axillo-inguinal pathway in SL which she preferred. Discussed Flexi touch briefly and gave pt flyer.   PATIENT EDUCATION:  Education details: Educated pt in supine wand flexion and scaption and wall slides to restore normal Right shoulder ROM. Briefly discussed role of lymphatics and gentleness of technique with Manual lymphatic drainage. Discussed POC, LOS, treatment interventions, gave script for compression bra Person educated: Patient Education method: Explanation, Demonstration, and Handouts Education comprehension: verbalized understanding and returned demonstration  HOME EXERCISE  PROGRAM: Supine wand flexion and scaption, standing wall slides for abduction.   ASSESSMENT:  CLINICAL IMPRESSION: Continued MLD for the Rt breast with good softening especially medially.  Breast complaints questionnaire has decreased to 15/80 but pt has still not quite met her goal of 10/80.  All other goals are met.   EVAL Patient is a 44 y.o. female who was seen today for physical therapy evaluation and treatment for complaints of right breast swelling/lymphedema which started in October 2025. She is s/p neoadjuvant chemotherapy and R breast lumpectomy with SLNB 0+/1 on 06/05/23. She completed radiation, and shortly after radiation developed some swelling in the right breast. It has improved some since starting back to her compression bra. She presents with left breast swelling with mild fibrosis and enlarged pores greatest medially. Incisions are well healed. Shoulder ROM on the right is mildly limited and she was given a HEP today to perform that will address the restricted areas. She was briefly educated in the lymphatic system today in preparation for manual lymphatic drainage techniques next visit.She will benefit from skilled PT to address deficits noted and return pt to PLOF.   OBJECTIVE IMPAIRMENTS: decreased knowledge of condition, increased edema, impaired flexibility, impaired UE functional use, and postural dysfunction.   ACTIVITY LIMITATIONS: lifting and reach over head  PARTICIPATION LIMITATIONS: none  PERSONAL FACTORS: 1-2 comorbidities: Right breast cancer s/p chemotherapy and radiation are also affecting patient's functional outcome.   REHAB POTENTIAL: Good  CLINICAL DECISION MAKING: Stable/uncomplicated  EVALUATION COMPLEXITY: Low  GOALS: Goals reviewed with patient? Yes  SHORT TERM GOALS=LONG TERM GOALS: Target date: 02/21/2023  Pt will be independent with MLD to decrease right breast swelling Baseline: Goal status: MET  2.  Pt will note decreased breast swelling  by 50% Baseline:  Goal status: REPORTS no change in swelling  3.  Pt will be compliant with compression bra to decrease right breast lymphedema Baseline:  Goal status: MET  4.  Breast complaints questionnaire will decrease by at least 10 points to demonstrate improvement in swelling Baseline:  Goal status: IN PROGRESS     PLAN:  PT FREQUENCY: 2x/week  PT DURATION: 6 weeks  PLANNED INTERVENTIONS: 97164- PT Re-evaluation, 97110-Therapeutic exercises, 97530- Therapeutic activity, 97112- Neuromuscular re-education, 97535- Self Care, [id number]- Manual therapy, [phone]- Orthotic Initial, and [phone]- Orthotic/Prosthetic subsequent  PLAN FOR NEXT SESSION:  pt interested in flexi but her insurance will change on 1/1 so can't submit until then, review MLD to right breast and start instructing pt, check ROM  Presly Steinruck, Saddie SAUNDERS, PT 01/30/2024, 10:29 AM   DIAGNOSIS ASSESSMENT [x]  Lymphedema, not elsewhere classified [I89.0]  Secondary to:  []  Post-mastectomy lymphedema [I97.2]   []  Hereditary/Congenital lymphedema [Q82.0]  []  Praecox []  Tarda []  Other:   LOCATION OF LYMPHEDEMA []   Right Arm  []  Left Arm  [x]  Chest/Breast  []  Abdomen/Trunk []  Other: Date of onset, or duration: 11/17/2023  SEVERITY Symptoms and observations from physical exam Stage of Lymphedema:  [x] Stage II  []  Stage III  [] Other: [x]  Hyperkeratosis [x]  Hyperpigmentation []  Lymphorrhea (weeping) []  Papillomatosis (warts, nodules, papules)  []  Elephantiasis  [x]  Fibrosis and/or radiation fibrosis []  Pitting edema []  Recurrent episode(s) of infection/cellulitis [x]  Pain [x]  Peau d'orange [x]  Chest and/or axillary swelling caused by: []  History of chest/breast/abdominal surgery(ies) [x]  Lymph node dissection, radiation and or chemotherapy [x]  Truncal and/or abdominal swelling []  Axillary cording []  Other, explain:  CONSERVATIVE TREATMENT (CT) INITATION Start date (if different from this visit date):  Conservative  treatment plan includes  the following activities (check all that apply): [x]  Regular exercise  [x]  Elevation of the limb(s) [x]  Compression garment(s) or compression bandage system  []  Compression garments with a minimum of             mmHg distally  [x]  Compression bra and/or tank top []  Compression bandage system  Type: []  Multi-layer short stretch []  Velcro reduction kit    []  Two- or three-layer prepackaged compression kit   []  Other:  []  Complete decongestive therapy (CDT) or lymphedema therapy   [x]  Minimum of 30 minutes daily MLD or self-MLD  []  Diet assessment with modification, if applicable. []  For patients with congestive heart failure (CHF) a medication assessment with modification(s), if applicable. []  Other:   Has the patient tried an [id number] basic pneumatic compression device? []  Yes  [x]  No Comments:   MEASUREMENTS Choose at least two anatomical locations.  []   Right Arm  01/24/24  []   Left Arm    Wrist                     cm  Wrist                     cm  Forearm                     cm  Forearm                     cm  Biceps                     cm  Biceps                     cm  Axilla (Chest)*  122                cm  Axilla (Chest)*                     cm  Umbilicus (Waist)*         125            cm  Umbilicus (Waist)*                     cm  *Include measurements if patient presents with lymphedema proximally into the chest or trunk   Other history and clinical information:  "

## 2024-01-30 ENCOUNTER — Encounter: Payer: Self-pay | Admitting: Rehabilitation

## 2024-01-31 ENCOUNTER — Ambulatory Visit: Admitting: Rehabilitation

## 2024-01-31 ENCOUNTER — Encounter: Payer: Self-pay | Admitting: Rehabilitation

## 2024-01-31 DIAGNOSIS — C50411 Malignant neoplasm of upper-outer quadrant of right female breast: Secondary | ICD-10-CM

## 2024-01-31 DIAGNOSIS — I890 Lymphedema, not elsewhere classified: Secondary | ICD-10-CM | POA: Diagnosis not present

## 2024-01-31 DIAGNOSIS — N63 Unspecified lump in unspecified breast: Secondary | ICD-10-CM

## 2024-01-31 DIAGNOSIS — Z483 Aftercare following surgery for neoplasm: Secondary | ICD-10-CM

## 2024-01-31 DIAGNOSIS — R293 Abnormal posture: Secondary | ICD-10-CM

## 2024-01-31 NOTE — Therapy (Signed)
 " OUTPATIENT PHYSICAL THERAPY  UPPER EXTREMITY ONCOLOGY TREATMENT  Patient Name: Kara Webb MRN: [id number] DOB:1980/09/23, 44 y.o., female Today's Date: 01/31/2024  END OF SESSION:  PT End of Session - 01/31/24 0856     Visit Number 8    Number of Visits 12    Date for Recertification  02/21/24    PT Start Time 0800    PT Stop Time 0849    PT Time Calculation (min) 49 min    Activity Tolerance Patient tolerated treatment well    Behavior During Therapy [facility] for tasks assessed/performed              Past Medical History:  Diagnosis Date   Breast cancer (HCC)    BV (bacterial vaginosis)    Cold intolerance 01/16/2005   Fatigue 01/16/2005   H/O chlamydia infection 08/12/2010   H/O urinary frequency 11/16/2000   H/O vaginal discharge 11/17/2003   H/O varicella    Heart murmur    Born with murmur   History of radiation therapy    Right breast-07/05/23-08/20/23- Dr. Lynwood Nasuti   HSV-1 infection    Hx: UTI (urinary tract infection)    Pelvic pain    Vulvovaginal candidiasis 04/16/2005   Yeast infection    Past Surgical History:  Procedure Laterality Date   BREAST BIOPSY Right 12/18/2022   US  RT BREAST BX W LOC DEV 1ST LESION IMG BX SPEC US  GUIDE 12/18/2022 [facility]   BREAST BIOPSY  06/01/2023   US  RT RADIOACTIVE SEED LOC 06/01/2023 [facility]   BREAST BIOPSY  06/01/2023   MM RT RADIOACTIVE SEED LOC MAMMO GUIDE 06/01/2023 [facility]   BREAST BIOPSY  06/01/2023   MM RT RADIOACTIVE SEED EA ADD LESION LOC MAMMO GUIDE 06/01/2023 [facility]   BREAST LUMPECTOMY WITH RADIOACTIVE SEED AND SENTINEL LYMPH NODE BIOPSY Right 06/05/2023   Procedure: RIGHT BREAST SEED LUMPECTOMY x2, RIGHT SEED TARGETED LYMPH NODE BIOPSY, RIGHT SENTINEL LYMPH NODE MAPPING;  Surgeon: Vanderbilt Ned, MD;  Location: [facility];  Service: General;  Laterality: Right;  RIGHT BREAST SEED LUMPECTOMY x2, RIGHT SEED TARGETED LYMPH NODE BIOPSY, RIGHT SENTINEL LYMPH  NODE MAPPING 90 MIN   IR CV LINE INJECTION  04/13/2023   PORT-A-CATH REMOVAL Right 12/06/2023   Procedure: REMOVAL PORT-A-CATH;  Surgeon: Vanderbilt Ned, MD;  Location: [facility];  Service: General;  Laterality: Right;   PORTACATH PLACEMENT N/A 01/03/2023   Procedure: INSERTION PORT-A-CATH WITH ULTRASOUND ANDREA;  Surgeon: Vanderbilt Ned, MD;  Location: [facility];  Service: General;  Laterality: N/A;   TOE SURGERY  2004   WISDOM TOOTH EXTRACTION  2003   Patient Active Problem List   Diagnosis Date Noted   Port-A-Cath in place 01/12/2023   Genetic testing 01/12/2023   Family history of breast cancer 12/27/2022   Family history of colon cancer 12/27/2022   Malignant neoplasm of upper-outer quadrant of right breast in female, estrogen receptor negative (HCC) 12/25/2022    PCP:   REFERRING PROVIDER: Mackey Chad, MD  REFERRING DIAG: Right breast Lymphedema  THERAPY DIAG:  Lymphedema, not elsewhere classified  Aftercare following surgery for neoplasm  Breast swelling  Malignant neoplasm of upper-outer quadrant of right breast in female, estrogen receptor negative (HCC)  Abnormal posture  ONSET DATE: October 2025  Rationale for Evaluation and Treatment: Rehabilitation  SUBJECTIVE:  SUBJECTIVE STATEMENT:  Yesterday when I woke up I think I had scratched my breast over night.  And it was kind of burning and red.    EVAL Pt noticed that her breast skin looks like orange peel. No soreness in breast. It feels tighter in the right breast. I started with my compression bra last week. She sees a print of her bra on her breast. Orange peel has improved some since wearing compression.  I feel a little limited in my shoulder reaching into the back seat.  PERTINENT  HISTORY:   Patient was diagnosed on 12/12/2022 with right grade 2 invasive ductal carcinoma breast cancer. It measures 1.3 cm and is located in the upper outer quadrant. It is ER/PR negative and HER2 positive with a Ki67 of 30%. She had a positive axillary lymph node. Completed neoadjuvant chemo. Underwent R breast lumpectomy and SLNB 0/1 on 06/05/23.  PAIN:   Are you having pain? No  PRECAUTIONS: Right UE lymphedema risk  RED FLAGS: None   WEIGHT BEARING RESTRICTIONS: No  FALLS:  Has patient fallen in last 6 months? No  LIVING ENVIRONMENT: Lives with: lives with their spouse, and son who is in college at [employer] Lives in: House/apartment   OCCUPATION: Adjustor for an insurance company   LEISURE: spend time with family and friends, cooking, phone,  HAND DOMINANCE: right   PRIOR LEVEL OF FUNCTION: Independent  PATIENT GOALS: Decrease breast swelling   OBJECTIVE: Note: Objective measures were completed at Evaluation unless otherwise noted.  COGNITION: Overall cognitive status: Within functional limits for tasks assessed   PALPATION: Mild tenderness at lateral breast incision,  OBSERVATIONS / OTHER ASSESSMENTS: mildly enlarged pores and orange peel greatest at medial breast, mild fibrosis medial breast with tenderness  SENSATION: Light touch: Deficits     POSTURE: forward head, rounded shoulders  UPPER EXTREMITY AROM/PROM:  A/PROM RIGHT   eval  RIGHT 01/07/2024  Shoulder extension 60   Shoulder flexion 153 158  Shoulder abduction 149 171  Shoulder internal rotation 70   Shoulder external rotation 106     (Blank rows = not tested)  A/PROM LEFT   eval  Shoulder extension 57  Shoulder flexion 163  Shoulder abduction 175  Shoulder internal rotation 68  Shoulder external rotation 103    (Blank rows = not tested)  CERVICAL AROM: All within fxl limits:     UPPER EXTREMITY STRENGTH:   LYMPHEDEMA ASSESSMENTS:   SURGERY TYPE/DATE: R breast lumpectomy  and SLNB 0/1 on 06/05/23.  NUMBER OF LYMPH NODES REMOVED: 0/1  CHEMOTHERAPY: YES  RADIATION:YES  HORMONE TREATMENT: NO  INFECTIONS: NO   LYMPHEDEMA ASSESSMENTS:   LANDMARK RIGHT  eval  At axilla    15 cm proximal to the proximal aspect of the olecranon process   10 cm proximal to the proximal aspect of the olecranon process   Olecranon process   15 cm proximal to the proximal aspect of the ulnar styloid process   10 cm proximal to the proximal aspect of the ulnar styloid process   Just distal to the ulnar styloid process   Across hand at thumb web space   At base of 2nd digit   (Blank rows = not tested)  LANDMARK LEFT  eval  At axilla    15 cm proximal to the proximal aspect of the olecranon process   10 cm proximal to the proximal aspect of the olecranon process   Olecranon process   15 cm proximal to the proximal  aspect of the ulnar styloid process   10 cm proximal to  the proximal aspect of the ulnar styloid process   Just distal to the ulnar styloid process   Across hand at thumb web space   At base of 2nd digit   (Blank rows = not tested)  Chest circumference just inferior to the axillae: 119.5 Chest circumference at the largest point:  132 cm   BREAST COMPLAINTS QUESTIONNAIRE   EVAL   01/29/24 Pain:  0   1 Heaviness: 0   1 Swollen feeling:5   5 Tense Skin: 5   0 Redness: 0   0 Bra Print: 5   5 Size of Pores: 8   2 Hard feeling:  1   1 Total:     24 /80   15/80 A Score over 9 indicates lymphedema issues in the breast                                                                                                                             TREATMENT DATE:  Pt permission and consent throughout each step of examination and treatment with modification and draping if requested when working on sensitive areas.  Pt is aware that a clinic chaperone is not available.   01/31/2023 MLD: In supine: Short neck, superficial and deep abdominals, bil axillary nodes  and establishment of interaxillary pathway, R inguinal nodes and establishment of axilloinguinal pathway, then R breast moving fluid towards pathways spending extra time in any areas of fibrosis then retracing all steps and ending with LN's.   01/29/2023 MLD: In supine: Short neck, superficial and deep abdominals, bil axillary nodes and establishment of interaxillary pathway, R inguinal nodes and establishment of axilloinguinal pathway, then R breast moving fluid towards pathways spending extra time in any areas of fibrosis then retracing all steps and ending with LN's.   01/25/2023 MLD: In supine: Short neck, superficial and deep abdominals, bil axillary nodes and establishment of interaxillary pathway, R inguinal nodes and establishment of axilloinguinal pathway, then R breast moving fluid towards pathways spending extra time in any areas of fibrosis then retracing all steps and ending with LN's.   PATIENT EDUCATION:  Education details: Educated pt in supine wand flexion and scaption and wall slides to restore normal Right shoulder ROM. Briefly discussed role of lymphatics and gentleness of technique with Manual lymphatic drainage. Discussed POC, LOS, treatment interventions, gave script for compression bra Person educated: Patient Education method: Explanation, Demonstration, and Handouts Education comprehension: verbalized understanding and returned demonstration  HOME EXERCISE PROGRAM: Supine wand flexion and scaption, standing wall slides for abduction.   ASSESSMENT:  CLINICAL IMPRESSION: Continued MLD for the Rt breast with good softening.  EVAL Patient is a 44 y.o. female who was seen today for physical therapy evaluation and treatment for complaints of right breast swelling/lymphedema which started in October 2025. She is s/p neoadjuvant chemotherapy and R breast lumpectomy with SLNB 0+/1 on 06/05/23. She completed radiation,  and shortly after radiation developed some swelling in the right  breast. It has improved some since starting back to her compression bra. She presents with left breast swelling with mild fibrosis and enlarged pores greatest medially. Incisions are well healed. Shoulder ROM on the right is mildly limited and she was given a HEP today to perform that will address the restricted areas. She was briefly educated in the lymphatic system today in preparation for manual lymphatic drainage techniques next visit.She will benefit from skilled PT to address deficits noted and return pt to PLOF.   OBJECTIVE IMPAIRMENTS: decreased knowledge of condition, increased edema, impaired flexibility, impaired UE functional use, and postural dysfunction.   ACTIVITY LIMITATIONS: lifting and reach over head  PARTICIPATION LIMITATIONS: none  PERSONAL FACTORS: 1-2 comorbidities: Right breast cancer s/p chemotherapy and radiation are also affecting patient's functional outcome.   REHAB POTENTIAL: Good  CLINICAL DECISION MAKING: Stable/uncomplicated  EVALUATION COMPLEXITY: Low  GOALS: Goals reviewed with patient? Yes  SHORT TERM GOALS=LONG TERM GOALS: Target date: 02/21/2023  Pt will be independent with MLD to decrease right breast swelling Baseline: Goal status: MET  2.  Pt will note decreased breast swelling by 50% Baseline:  Goal status: REPORTS no change in swelling  3.  Pt will be compliant with compression bra to decrease right breast lymphedema Baseline:  Goal status: MET  4.  Breast complaints questionnaire will decrease by at least 10 points to demonstrate improvement in swelling Baseline:  Goal status: IN PROGRESS     PLAN:  PT FREQUENCY: 2x/week  PT DURATION: 6 weeks  PLANNED INTERVENTIONS: 97164- PT Re-evaluation, 97110-Therapeutic exercises, 97530- Therapeutic activity, 97112- Neuromuscular re-education, 97535- Self Care, 02859- Manual therapy, [phone]- Orthotic Initial, and [phone]- Orthotic/Prosthetic subsequent  PLAN FOR NEXT SESSION:  pt interested  in flexi but her insurance will change on 1/1 so can't submit until then, review MLD to right breast and start instructing pt, check ROM  Rolena Knutson, Saddie SAUNDERS, PT 01/31/2024, 8:57 AM   DIAGNOSIS ASSESSMENT [x]  Lymphedema, not elsewhere classified [I89.0]  Secondary to:  []  Post-mastectomy lymphedema [I97.2]   []  Hereditary/Congenital lymphedema [Q82.0]  []  Praecox []  Tarda []  Other:   LOCATION OF LYMPHEDEMA []   Right Arm  []  Left Arm  [x]  Chest/Breast  []  Abdomen/Trunk []  Other: Date of onset, or duration: 11/17/2023  SEVERITY Symptoms and observations from physical exam Stage of Lymphedema:  [x] Stage II  []  Stage III  [] Other: [x]  Hyperkeratosis [x]  Hyperpigmentation []  Lymphorrhea (weeping) []  Papillomatosis (warts, nodules, papules)  []  Elephantiasis  [x]  Fibrosis and/or radiation fibrosis []  Pitting edema []  Recurrent episode(s) of infection/cellulitis [x]  Pain [x]  Peau d'orange [x]  Chest and/or axillary swelling caused by: []  History of chest/breast/abdominal surgery(ies) [x]  Lymph node dissection, radiation and or chemotherapy [x]  Truncal and/or abdominal swelling []  Axillary cording []  Other, explain:  CONSERVATIVE TREATMENT (CT) INITATION Start date (if different from this visit date):  Conservative treatment plan includes the following activities (check all that apply): [x]  Regular exercise  [x]  Elevation of the limb(s) [x]  Compression garment(s) or compression bandage system  []  Compression garments with a minimum of             mmHg distally  [x]  Compression bra and/or tank top []  Compression bandage system  Type: []  Multi-layer short stretch []  Velcro reduction kit    []  Two- or three-layer prepackaged compression kit   []  Other:  []  Complete decongestive therapy (CDT) or lymphedema therapy   [x]  Minimum of 30 minutes daily MLD or self-MLD  []   Diet assessment with modification, if applicable. []  For patients with congestive heart failure (CHF) a medication  assessment with modification(s), if applicable. []  Other:   Has the patient tried an [id number] basic pneumatic compression device? []  Yes  [x]  No Comments:   MEASUREMENTS Choose at least two anatomical locations.  []   Right Arm  01/24/24  []   Left Arm    Wrist                     cm  Wrist                     cm  Forearm                     cm  Forearm                     cm  Biceps                     cm  Biceps                     cm  Axilla (Chest)*  122                cm  Axilla (Chest)*                     cm  Umbilicus (Waist)*         125            cm  Umbilicus (Waist)*                     cm  *Include measurements if patient presents with lymphedema proximally into the chest or trunk   Other history and clinical information:  "

## 2024-02-05 ENCOUNTER — Ambulatory Visit: Admitting: Rehabilitation

## 2024-02-07 ENCOUNTER — Encounter: Payer: Self-pay | Admitting: Hematology and Oncology

## 2024-02-07 ENCOUNTER — Ambulatory Visit: Admitting: Rehabilitation

## 2024-02-07 DIAGNOSIS — C50411 Malignant neoplasm of upper-outer quadrant of right female breast: Secondary | ICD-10-CM

## 2024-02-07 DIAGNOSIS — R293 Abnormal posture: Secondary | ICD-10-CM

## 2024-02-07 DIAGNOSIS — I890 Lymphedema, not elsewhere classified: Secondary | ICD-10-CM

## 2024-02-07 DIAGNOSIS — N63 Unspecified lump in unspecified breast: Secondary | ICD-10-CM

## 2024-02-07 DIAGNOSIS — Z483 Aftercare following surgery for neoplasm: Secondary | ICD-10-CM

## 2024-02-07 NOTE — Therapy (Signed)
 " OUTPATIENT PHYSICAL THERAPY  UPPER EXTREMITY ONCOLOGY TREATMENT  Patient Name: Kara Webb MRN: [id number] DOB:22-Aug-1980, 44 y.o., female Today's Date: 02/07/2024  END OF SESSION:  PT End of Session - 02/07/24 1654     Visit Number 9    Number of Visits 12    Date for Recertification  02/21/24    PT Start Time 1603    PT Stop Time 1649    PT Time Calculation (min) 46 min    Activity Tolerance Patient tolerated treatment well    Behavior During Therapy [facility] for tasks assessed/performed               Past Medical History:  Diagnosis Date   Breast cancer (HCC)    BV (bacterial vaginosis)    Cold intolerance 01/16/2005   Fatigue 01/16/2005   H/O chlamydia infection 08/12/2010   H/O urinary frequency 11/16/2000   H/O vaginal discharge 11/17/2003   H/O varicella    Heart murmur    Born with murmur   History of radiation therapy    Right breast-07/05/23-08/20/23- Dr. Lynwood Nasuti   HSV-1 infection    Hx: UTI (urinary tract infection)    Pelvic pain    Vulvovaginal candidiasis 04/16/2005   Yeast infection    Past Surgical History:  Procedure Laterality Date   BREAST BIOPSY Right 12/18/2022   US  RT BREAST BX W LOC DEV 1ST LESION IMG BX SPEC US  GUIDE 12/18/2022 [facility]   BREAST BIOPSY  06/01/2023   US  RT RADIOACTIVE SEED LOC 06/01/2023 [facility]   BREAST BIOPSY  06/01/2023   MM RT RADIOACTIVE SEED LOC MAMMO GUIDE 06/01/2023 [facility]   BREAST BIOPSY  06/01/2023   MM RT RADIOACTIVE SEED EA ADD LESION LOC MAMMO GUIDE 06/01/2023 [facility]   BREAST LUMPECTOMY WITH RADIOACTIVE SEED AND SENTINEL LYMPH NODE BIOPSY Right 06/05/2023   Procedure: RIGHT BREAST SEED LUMPECTOMY x2, RIGHT SEED TARGETED LYMPH NODE BIOPSY, RIGHT SENTINEL LYMPH NODE MAPPING;  Surgeon: Vanderbilt Ned, MD;  Location: [facility];  Service: General;  Laterality: Right;  RIGHT BREAST SEED LUMPECTOMY x2, RIGHT SEED TARGETED LYMPH NODE BIOPSY, RIGHT SENTINEL  LYMPH NODE MAPPING 90 MIN   IR CV LINE INJECTION  04/13/2023   PORT-A-CATH REMOVAL Right 12/06/2023   Procedure: REMOVAL PORT-A-CATH;  Surgeon: Vanderbilt Ned, MD;  Location: [facility];  Service: General;  Laterality: Right;   PORTACATH PLACEMENT N/A 01/03/2023   Procedure: INSERTION PORT-A-CATH WITH ULTRASOUND ANDREA;  Surgeon: Vanderbilt Ned, MD;  Location: [facility];  Service: General;  Laterality: N/A;   TOE SURGERY  2004   WISDOM TOOTH EXTRACTION  2003   Patient Active Problem List   Diagnosis Date Noted   Port-A-Cath in place 01/12/2023   Genetic testing 01/12/2023   Family history of breast cancer 12/27/2022   Family history of colon cancer 12/27/2022   Malignant neoplasm of upper-outer quadrant of right breast in female, estrogen receptor negative (HCC) 12/25/2022    PCP:   REFERRING PROVIDER: Mackey Chad, MD  REFERRING DIAG: Right breast Lymphedema  THERAPY DIAG:  Lymphedema, not elsewhere classified  Aftercare following surgery for neoplasm  Breast swelling  Malignant neoplasm of upper-outer quadrant of right breast in female, estrogen receptor negative (HCC)  Abnormal posture  ONSET DATE: October 2025  Rationale for Evaluation and Treatment: Rehabilitation  SUBJECTIVE:  SUBJECTIVE STATEMENT:  I wasn't feeling well so I didn't do any massage.    EVAL Pt noticed that her breast skin looks like orange peel. No soreness in breast. It feels tighter in the right breast. I started with my compression bra last week. She sees a print of her bra on her breast. Orange peel has improved some since wearing compression.  I feel a little limited in my shoulder reaching into the back seat.  PERTINENT HISTORY:   Patient was diagnosed on 12/12/2022 with  right grade 2 invasive ductal carcinoma breast cancer. It measures 1.3 cm and is located in the upper outer quadrant. It is ER/PR negative and HER2 positive with a Ki67 of 30%. She had a positive axillary lymph node. Completed neoadjuvant chemo. Underwent R breast lumpectomy and SLNB 0/1 on 06/05/23.  PAIN:   Are you having pain? No  PRECAUTIONS: Right UE lymphedema risk  RED FLAGS: None   WEIGHT BEARING RESTRICTIONS: No  FALLS:  Has patient fallen in last 6 months? No  LIVING ENVIRONMENT: Lives with: lives with their spouse, and son who is in college at [employer] Lives in: House/apartment   OCCUPATION: Adjustor for an insurance company   LEISURE: spend time with family and friends, cooking, phone,  HAND DOMINANCE: right   PRIOR LEVEL OF FUNCTION: Independent  PATIENT GOALS: Decrease breast swelling   OBJECTIVE: Note: Objective measures were completed at Evaluation unless otherwise noted.  COGNITION: Overall cognitive status: Within functional limits for tasks assessed   PALPATION: Mild tenderness at lateral breast incision,  OBSERVATIONS / OTHER ASSESSMENTS: mildly enlarged pores and orange peel greatest at medial breast, mild fibrosis medial breast with tenderness  SENSATION: Light touch: Deficits     POSTURE: forward head, rounded shoulders  UPPER EXTREMITY AROM/PROM:  A/PROM RIGHT   eval  RIGHT 01/07/2024  Shoulder extension 60   Shoulder flexion 153 158  Shoulder abduction 149 171  Shoulder internal rotation 70   Shoulder external rotation 106     (Blank rows = not tested)  A/PROM LEFT   eval  Shoulder extension 57  Shoulder flexion 163  Shoulder abduction 175  Shoulder internal rotation 68  Shoulder external rotation 103    (Blank rows = not tested)  CERVICAL AROM: All within fxl limits:     UPPER EXTREMITY STRENGTH:   LYMPHEDEMA ASSESSMENTS:   SURGERY TYPE/DATE: R breast lumpectomy and SLNB 0/1 on 06/05/23.  NUMBER OF LYMPH NODES  REMOVED: 0/1  CHEMOTHERAPY: YES  RADIATION:YES  HORMONE TREATMENT: NO  INFECTIONS: NO   LYMPHEDEMA ASSESSMENTS:   LANDMARK RIGHT  eval  At axilla    15 cm proximal to the proximal aspect of the olecranon process   10 cm proximal to the proximal aspect of the olecranon process   Olecranon process   15 cm proximal to the proximal aspect of the ulnar styloid process   10 cm proximal to the proximal aspect of the ulnar styloid process   Just distal to the ulnar styloid process   Across hand at thumb web space   At base of 2nd digit   (Blank rows = not tested)  LANDMARK LEFT  eval  At axilla    15 cm proximal to the proximal aspect of the olecranon process   10 cm proximal to the proximal aspect of the olecranon process   Olecranon process   15 cm proximal to the proximal aspect of the ulnar styloid process   10 cm proximal to  the proximal aspect of the ulnar styloid process   Just distal to the ulnar styloid process   Across hand at thumb web space   At base of 2nd digit   (Blank rows = not tested)  Chest circumference just inferior to the axillae: 119.5 Chest circumference at the largest point:  132 cm   BREAST COMPLAINTS QUESTIONNAIRE   EVAL   01/29/24 Pain:  0   1 Heaviness: 0   1 Swollen feeling:5   5 Tense Skin: 5   0 Redness: 0   0 Bra Print: 5   5 Size of Pores: 8   2 Hard feeling:  1   1 Total:     24 /80   15/80 A Score over 9 indicates lymphedema issues in the breast                                                                                                                             TREATMENT DATE:  Pt permission and consent throughout each step of examination and treatment with modification and draping if requested when working on sensitive areas.  Pt is aware that a clinic chaperone is not available.   02/07/2023 MLD: In supine: Short neck, superficial and deep abdominals, bil axillary nodes and establishment of interaxillary pathway, R  inguinal nodes and establishment of axilloinguinal pathway, then R breast moving fluid towards pathways spending extra time in any areas of fibrosis then retracing all steps and ending with LN's.   01/31/2023 MLD: In supine: Short neck, superficial and deep abdominals, bil axillary nodes and establishment of interaxillary pathway, R inguinal nodes and establishment of axilloinguinal pathway, then R breast moving fluid towards pathways spending extra time in any areas of fibrosis then retracing all steps and ending with LN's.   01/29/2023 MLD: In supine: Short neck, superficial and deep abdominals, bil axillary nodes and establishment of interaxillary pathway, R inguinal nodes and establishment of axilloinguinal pathway, then R breast moving fluid towards pathways spending extra time in any areas of fibrosis then retracing all steps and ending with LN's.   01/25/2023 MLD: In supine: Short neck, superficial and deep abdominals, bil axillary nodes and establishment of interaxillary pathway, R inguinal nodes and establishment of axilloinguinal pathway, then R breast moving fluid towards pathways spending extra time in any areas of fibrosis then retracing all steps and ending with LN's.   PATIENT EDUCATION:  Education details: Educated pt in supine wand flexion and scaption and wall slides to restore normal Right shoulder ROM. Briefly discussed role of lymphatics and gentleness of technique with Manual lymphatic drainage. Discussed POC, LOS, treatment interventions, gave script for compression bra Person educated: Patient Education method: Explanation, Demonstration, and Handouts Education comprehension: verbalized understanding and returned demonstration  HOME EXERCISE PROGRAM: Supine wand flexion and scaption, standing wall slides for abduction.   ASSESSMENT:  CLINICAL IMPRESSION: Continued MLD for the Rt breast with good softening.  EVAL Patient is a 44 y.o. female  who was seen today for physical  therapy evaluation and treatment for complaints of right breast swelling/lymphedema which started in October 2025. She is s/p neoadjuvant chemotherapy and R breast lumpectomy with SLNB 0+/1 on 06/05/23. She completed radiation, and shortly after radiation developed some swelling in the right breast. It has improved some since starting back to her compression bra. She presents with left breast swelling with mild fibrosis and enlarged pores greatest medially. Incisions are well healed. Shoulder ROM on the right is mildly limited and she was given a HEP today to perform that will address the restricted areas. She was briefly educated in the lymphatic system today in preparation for manual lymphatic drainage techniques next visit.She will benefit from skilled PT to address deficits noted and return pt to PLOF.   OBJECTIVE IMPAIRMENTS: decreased knowledge of condition, increased edema, impaired flexibility, impaired UE functional use, and postural dysfunction.   ACTIVITY LIMITATIONS: lifting and reach over head  PARTICIPATION LIMITATIONS: none  PERSONAL FACTORS: 1-2 comorbidities: Right breast cancer s/p chemotherapy and radiation are also affecting patient's functional outcome.   REHAB POTENTIAL: Good  CLINICAL DECISION MAKING: Stable/uncomplicated  EVALUATION COMPLEXITY: Low  GOALS: Goals reviewed with patient? Yes  SHORT TERM GOALS=LONG TERM GOALS: Target date: 02/21/2023  Pt will be independent with MLD to decrease right breast swelling Baseline: Goal status: MET  2.  Pt will note decreased breast swelling by 50% Baseline:  Goal status: REPORTS no change in swelling  3.  Pt will be compliant with compression bra to decrease right breast lymphedema Baseline:  Goal status: MET  4.  Breast complaints questionnaire will decrease by at least 10 points to demonstrate improvement in swelling Baseline:  Goal status: IN PROGRESS     PLAN:  PT FREQUENCY: 2x/week  PT DURATION: 6  weeks  PLANNED INTERVENTIONS: 97164- PT Re-evaluation, 97110-Therapeutic exercises, 97530- Therapeutic activity, 97112- Neuromuscular re-education, 97535- Self Care, [id number]- Manual therapy, [phone]- Orthotic Initial, and [phone]- Orthotic/Prosthetic subsequent  PLAN FOR NEXT SESSION:  pt interested in flexi but her insurance will change on 1/1 so can't submit until then, review MLD to right breast and start instructing pt, check ROM  Sinan Tuch R, PT 02/07/2024, 4:55 PM   DIAGNOSIS ASSESSMENT [x]  Lymphedema, not elsewhere classified [I89.0]  Secondary to:  []  Post-mastectomy lymphedema [I97.2]   []  Hereditary/Congenital lymphedema [Q82.0]  []  Praecox []  Tarda []  Other:   LOCATION OF LYMPHEDEMA []   Right Arm  []  Left Arm  [x]  Chest/Breast  []  Abdomen/Trunk []  Other: Date of onset, or duration: 11/17/2023  SEVERITY Symptoms and observations from physical exam Stage of Lymphedema:  [x] Stage II  []  Stage III  [] Other: [x]  Hyperkeratosis [x]  Hyperpigmentation []  Lymphorrhea (weeping) []  Papillomatosis (warts, nodules, papules)  []  Elephantiasis  [x]  Fibrosis and/or radiation fibrosis []  Pitting edema []  Recurrent episode(s) of infection/cellulitis [x]  Pain [x]  Peau d'orange [x]  Chest and/or axillary swelling caused by: []  History of chest/breast/abdominal surgery(ies) [x]  Lymph node dissection, radiation and or chemotherapy [x]  Truncal and/or abdominal swelling []  Axillary cording []  Other, explain:  CONSERVATIVE TREATMENT (CT) INITATION Start date (if different from this visit date):  Conservative treatment plan includes the following activities (check all that apply): [x]  Regular exercise  [x]  Elevation of the limb(s) [x]  Compression garment(s) or compression bandage system  []  Compression garments with a minimum of             mmHg distally  [x]  Compression bra and/or tank top []  Compression bandage system  Type: []  Multi-layer short stretch []   Velcro reduction kit    []   Two- or three-layer prepackaged compression kit   []  Other:  []  Complete decongestive therapy (CDT) or lymphedema therapy   [x]  Minimum of 30 minutes daily MLD or self-MLD  []  Diet assessment with modification, if applicable. []  For patients with congestive heart failure (CHF) a medication assessment with modification(s), if applicable. []  Other:   Has the patient tried an [id number] basic pneumatic compression device? []  Yes  [x]  No Comments:   MEASUREMENTS Choose at least two anatomical locations.  []   Right Arm  01/24/24  []   Left Arm    Wrist                     cm  Wrist                     cm  Forearm                     cm  Forearm                     cm  Biceps                     cm  Biceps                     cm  Axilla (Chest)*  122                cm  Axilla (Chest)*                     cm  Umbilicus (Waist)*         125            cm  Umbilicus (Waist)*                     cm  *Include measurements if patient presents with lymphedema proximally into the chest or trunk   Other history and clinical information:  "

## 2024-02-13 ENCOUNTER — Ambulatory Visit

## 2024-02-13 DIAGNOSIS — Z171 Estrogen receptor negative status [ER-]: Secondary | ICD-10-CM

## 2024-02-13 DIAGNOSIS — R293 Abnormal posture: Secondary | ICD-10-CM

## 2024-02-13 DIAGNOSIS — Z483 Aftercare following surgery for neoplasm: Secondary | ICD-10-CM

## 2024-02-13 DIAGNOSIS — N63 Unspecified lump in unspecified breast: Secondary | ICD-10-CM

## 2024-02-13 DIAGNOSIS — I890 Lymphedema, not elsewhere classified: Secondary | ICD-10-CM

## 2024-02-13 NOTE — Therapy (Signed)
 " OUTPATIENT PHYSICAL THERAPY  UPPER EXTREMITY ONCOLOGY TREATMENT  Patient Name: Kara Webb MRN: [id number] DOB:Jun 05, 1980, 44 y.o., female Today's Date: 02/13/2024  END OF SESSION:  PT End of Session - 02/13/24 1607     Visit Number 10    Number of Visits 12    Date for Recertification  02/21/24    PT Start Time 1605    PT Stop Time 1700    PT Time Calculation (min) 55 min    Activity Tolerance Patient tolerated treatment well    Behavior During Therapy [facility] for tasks assessed/performed               Past Medical History:  Diagnosis Date   Breast cancer (HCC)    BV (bacterial vaginosis)    Cold intolerance 01/16/2005   Fatigue 01/16/2005   H/O chlamydia infection 08/12/2010   H/O urinary frequency 11/16/2000   H/O vaginal discharge 11/17/2003   H/O varicella    Heart murmur    Born with murmur   History of radiation therapy    Right breast-07/05/23-08/20/23- Dr. Lynwood Nasuti   HSV-1 infection    Hx: UTI (urinary tract infection)    Pelvic pain    Vulvovaginal candidiasis 04/16/2005   Yeast infection    Past Surgical History:  Procedure Laterality Date   BREAST BIOPSY Right 12/18/2022   US  RT BREAST BX W LOC DEV 1ST LESION IMG BX SPEC US  GUIDE 12/18/2022 [facility]   BREAST BIOPSY  06/01/2023   US  RT RADIOACTIVE SEED LOC 06/01/2023 [facility]   BREAST BIOPSY  06/01/2023   MM RT RADIOACTIVE SEED LOC MAMMO GUIDE 06/01/2023 [facility]   BREAST BIOPSY  06/01/2023   MM RT RADIOACTIVE SEED EA ADD LESION LOC MAMMO GUIDE 06/01/2023 [facility]   BREAST LUMPECTOMY WITH RADIOACTIVE SEED AND SENTINEL LYMPH NODE BIOPSY Right 06/05/2023   Procedure: RIGHT BREAST SEED LUMPECTOMY x2, RIGHT SEED TARGETED LYMPH NODE BIOPSY, RIGHT SENTINEL LYMPH NODE MAPPING;  Surgeon: Vanderbilt Ned, MD;  Location: [facility];  Service: General;  Laterality: Right;  RIGHT BREAST SEED LUMPECTOMY x2, RIGHT SEED TARGETED LYMPH NODE BIOPSY, RIGHT SENTINEL  LYMPH NODE MAPPING 90 MIN   IR CV LINE INJECTION  04/13/2023   PORT-A-CATH REMOVAL Right 12/06/2023   Procedure: REMOVAL PORT-A-CATH;  Surgeon: Vanderbilt Ned, MD;  Location: [facility];  Service: General;  Laterality: Right;   PORTACATH PLACEMENT N/A 01/03/2023   Procedure: INSERTION PORT-A-CATH WITH ULTRASOUND ANDREA;  Surgeon: Vanderbilt Ned, MD;  Location: [facility];  Service: General;  Laterality: N/A;   TOE SURGERY  2004   WISDOM TOOTH EXTRACTION  2003   Patient Active Problem List   Diagnosis Date Noted   Port-A-Cath in place 01/12/2023   Genetic testing 01/12/2023   Family history of breast cancer 12/27/2022   Family history of colon cancer 12/27/2022   Malignant neoplasm of upper-outer quadrant of right breast in female, estrogen receptor negative (HCC) 12/25/2022    PCP:   REFERRING PROVIDER: Mackey Chad, MD  REFERRING DIAG: Right breast Lymphedema  THERAPY DIAG:  Lymphedema, not elsewhere classified  Aftercare following surgery for neoplasm  Breast swelling  Malignant neoplasm of upper-outer quadrant of right breast in female, estrogen receptor negative (HCC)  Abnormal posture  ONSET DATE: October 2025  Rationale for Evaluation and Treatment: Rehabilitation  SUBJECTIVE:  SUBJECTIVE STATEMENT:  The area of peau d'orange is less now than it was before we started treatment. Also the new compression bra has also helped lessen the firm area on the inside of my breast.  I haven't heard anything from the compression pump company yet.   EVAL Pt noticed that her breast skin looks like orange peel. No soreness in breast. It feels tighter in the right breast. I started with my compression bra last week. She sees a print of her bra on her breast. Orange  peel has improved some since wearing compression.  I feel a little limited in my shoulder reaching into the back seat.  PERTINENT HISTORY:   Patient was diagnosed on 12/12/2022 with right grade 2 invasive ductal carcinoma breast cancer. It measures 1.3 cm and is located in the upper outer quadrant. It is ER/PR negative and HER2 positive with a Ki67 of 30%. She had a positive axillary lymph node. Completed neoadjuvant chemo. Underwent R breast lumpectomy and SLNB 0/1 on 06/05/23.  PAIN:   Are you having pain? No  PRECAUTIONS: Right UE lymphedema risk  RED FLAGS: None   WEIGHT BEARING RESTRICTIONS: No  FALLS:  Has patient fallen in last 6 months? No  LIVING ENVIRONMENT: Lives with: lives with their spouse, and son who is in college at [employer] Lives in: House/apartment   OCCUPATION: Adjustor for an insurance company   LEISURE: spend time with family and friends, cooking, phone,  HAND DOMINANCE: right   PRIOR LEVEL OF FUNCTION: Independent  PATIENT GOALS: Decrease breast swelling   OBJECTIVE: Note: Objective measures were completed at Evaluation unless otherwise noted.  COGNITION: Overall cognitive status: Within functional limits for tasks assessed   PALPATION: Mild tenderness at lateral breast incision,  OBSERVATIONS / OTHER ASSESSMENTS: mildly enlarged pores and orange peel greatest at medial breast, mild fibrosis medial breast with tenderness  SENSATION: Light touch: Deficits     POSTURE: forward head, rounded shoulders  UPPER EXTREMITY AROM/PROM:  A/PROM RIGHT   eval  RIGHT 01/07/2024  Shoulder extension 60   Shoulder flexion 153 158  Shoulder abduction 149 171  Shoulder internal rotation 70   Shoulder external rotation 106     (Blank rows = not tested)  A/PROM LEFT   eval  Shoulder extension 57  Shoulder flexion 163  Shoulder abduction 175  Shoulder internal rotation 68  Shoulder external rotation 103    (Blank rows = not tested)  CERVICAL  AROM: All within fxl limits:     UPPER EXTREMITY STRENGTH:   LYMPHEDEMA ASSESSMENTS:   SURGERY TYPE/DATE: R breast lumpectomy and SLNB 0/1 on 06/05/23.  NUMBER OF LYMPH NODES REMOVED: 0/1  CHEMOTHERAPY: YES  RADIATION:YES  HORMONE TREATMENT: NO  INFECTIONS: NO   LYMPHEDEMA ASSESSMENTS:   LANDMARK RIGHT  eval  At axilla    15 cm proximal to the proximal aspect of the olecranon process   10 cm proximal to the proximal aspect of the olecranon process   Olecranon process   15 cm proximal to the proximal aspect of the ulnar styloid process   10 cm proximal to the proximal aspect of the ulnar styloid process   Just distal to the ulnar styloid process   Across hand at thumb web space   At base of 2nd digit   (Blank rows = not tested)  LANDMARK LEFT  eval  At axilla    15 cm proximal to the proximal aspect of the olecranon process   10 cm proximal  to the proximal aspect of the olecranon process   Olecranon process   15 cm proximal to the proximal aspect of the ulnar styloid process   10 cm proximal to  the proximal aspect of the ulnar styloid process   Just distal to the ulnar styloid process   Across hand at thumb web space   At base of 2nd digit   (Blank rows = not tested)  Chest circumference just inferior to the axillae: 119.5 Chest circumference at the largest point:  132 cm   BREAST COMPLAINTS QUESTIONNAIRE   EVAL   01/29/24 Pain:  0   1 Heaviness: 0   1 Swollen feeling:5   5 Tense Skin: 5   0 Redness: 0   0 Bra Print: 5   5 Size of Pores: 8   2 Hard feeling:  1   1 Total:     24 /80   15/80 A Score over 9 indicates lymphedema issues in the breast                                                                                                                             TREATMENT DATE:  Pt permission and consent throughout each step of examination and treatment with modification and draping if requested when working on sensitive areas.  Pt is aware  that a clinic chaperone is not available.   02/13/24: MLD: In supine: Short neck, superficial and deep abdominals, Lt axillary nodes and establishment of anterior interaxillary pathway, Rt inguinal nodes and establishment of Rt axilloinguinal pathway, then Rt breast moving fluid towards pathways spending extra time in area of fibrosis at medial breast then retracing all steps and ending with LN's.  P/ROM in supine to Rt shoulder into flex, abd and D2 with scapular depression throughout by therapist MFR to cording that was found in Rt axilla. 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  02/07/2023 MLD: In supine: Short neck, superficial and deep abdominals, bil axillary nodes and establishment of interaxillary pathway, R inguinal nodes and establishment of axilloinguinal pathway, then R breast moving fluid towards pathways spending extra time in any areas of fibrosis then retracing all steps and ending with LN's.   01/31/2023 MLD: In supine: Short neck, superficial and deep abdominals, bil axillary nodes and establishment of interaxillary pathway, R inguinal nodes and establishment of axilloinguinal pathway, then R breast moving fluid towards pathways spending extra time in any areas of fibrosis then retracing all steps and ending with LN's.     PATIENT EDUCATION:  Education details: Educated pt in supine wand flexion and scaption and wall slides to restore normal Right shoulder ROM. Briefly discussed role of lymphatics and gentleness of technique with Manual lymphatic drainage. Discussed POC, LOS, treatment interventions, gave script for compression bra Person educated: Patient Education method: Explanation, Demonstration, and Handouts Education comprehension: verbalized understanding and returned demonstration  HOME EXERCISE PROGRAM: Supine wand flexion and  scaption, standing wall slides for abduction.   ASSESSMENT:  CLINICAL  IMPRESSION: Continued MLD for the Rt breast with good softening noted at fibrosis at medial breast. Cording visible and palpable in Rt axilla so included MFR here and educated pt about this.     EVAL Patient is a 44 y.o. female who was seen today for physical therapy evaluation and treatment for complaints of right breast swelling/lymphedema which started in October 2025. She is s/p neoadjuvant chemotherapy and R breast lumpectomy with SLNB 0+/1 on 06/05/23. She completed radiation, and shortly after radiation developed some swelling in the right breast. It has improved some since starting back to her compression bra. She presents with left breast swelling with mild fibrosis and enlarged pores greatest medially. Incisions are well healed. Shoulder ROM on the right is mildly limited and she was given a HEP today to perform that will address the restricted areas. She was briefly educated in the lymphatic system today in preparation for manual lymphatic drainage techniques next visit.She will benefit from skilled PT to address deficits noted and return pt to PLOF.   OBJECTIVE IMPAIRMENTS: decreased knowledge of condition, increased edema, impaired flexibility, impaired UE functional use, and postural dysfunction.   ACTIVITY LIMITATIONS: lifting and reach over head  PARTICIPATION LIMITATIONS: none  PERSONAL FACTORS: 1-2 comorbidities: Right breast cancer s/p chemotherapy and radiation are also affecting patient's functional outcome.   REHAB POTENTIAL: Good  CLINICAL DECISION MAKING: Stable/uncomplicated  EVALUATION COMPLEXITY: Low  GOALS: Goals reviewed with patient? Yes  SHORT TERM GOALS=LONG TERM GOALS: Target date: 02/21/2023  Pt will be independent with MLD to decrease right breast swelling Baseline: Goal status: MET  2.  Pt will note decreased breast swelling by 50% Baseline:  Goal status: REPORTS no change in swelling  3.  Pt will be compliant with compression bra to decrease right  breast lymphedema Baseline:  Goal status: MET  4.  Breast complaints questionnaire will decrease by at least 10 points to demonstrate improvement in swelling Baseline:  Goal status: IN PROGRESS     PLAN:  PT FREQUENCY: 2x/week  PT DURATION: 6 weeks  PLANNED INTERVENTIONS: 97164- PT Re-evaluation, 97110-Therapeutic exercises, 97530- Therapeutic activity, 97112- Neuromuscular re-education, 97535- Self Care, [id number]- Manual therapy, 97760- Orthotic Initial, and [id number]- Orthotic/Prosthetic subsequent  PLAN FOR NEXT SESSION:  Has pt heard from Flexi? review and cont MLD to right breast, check ROM  Aden Berwyn Caldron, PTA 02/13/2024, 5:09 PM   DIAGNOSIS ASSESSMENT [x]  Lymphedema, not elsewhere classified [I89.0]  Secondary to:  []  Post-mastectomy lymphedema [I97.2]   []  Hereditary/Congenital lymphedema [Q82.0]  []  Praecox []  Tarda []  Other:   LOCATION OF LYMPHEDEMA []   Right Arm  []  Left Arm  [x]  Chest/Breast  []  Abdomen/Trunk []  Other: Date of onset, or duration: 11/17/2023  SEVERITY Symptoms and observations from physical exam Stage of Lymphedema:  [x] Stage II  []  Stage III  [] Other: [x]  Hyperkeratosis [x]  Hyperpigmentation []  Lymphorrhea (weeping) []  Papillomatosis (warts, nodules, papules)  []  Elephantiasis  [x]  Fibrosis and/or radiation fibrosis []  Pitting edema []  Recurrent episode(s) of infection/cellulitis [x]  Pain [x]  Peau d'orange [x]  Chest and/or axillary swelling caused by: []  History of chest/breast/abdominal surgery(ies) [x]  Lymph node dissection, radiation and or chemotherapy [x]  Truncal and/or abdominal swelling []  Axillary cording []  Other, explain:  CONSERVATIVE TREATMENT (CT) INITATION Start date (if different from this visit date):  Conservative treatment plan includes the following activities (check all that apply): [x]  Regular exercise  [x]  Elevation of the limb(s) [x]  Compression garment(s) or compression  bandage system  []  Compression  garments with a minimum of             mmHg distally  [x]  Compression bra and/or tank top []  Compression bandage system  Type: []  Multi-layer short stretch []  Velcro reduction kit    []  Two- or three-layer prepackaged compression kit   []  Other:  []  Complete decongestive therapy (CDT) or lymphedema therapy   [x]  Minimum of 30 minutes daily MLD or self-MLD  []  Diet assessment with modification, if applicable. []  For patients with congestive heart failure (CHF) a medication assessment with modification(s), if applicable. []  Other:   Has the patient tried an [id number] basic pneumatic compression device? []  Yes  [x]  No Comments:   MEASUREMENTS Choose at least two anatomical locations.  []   Right Arm  01/24/24  []   Left Arm    Wrist                     cm  Wrist                     cm  Forearm                     cm  Forearm                     cm  Biceps                     cm  Biceps                     cm  Axilla (Chest)*  122                cm  Axilla (Chest)*                     cm  Umbilicus (Waist)*         125            cm  Umbilicus (Waist)*                     cm  *Include measurements if patient presents with lymphedema proximally into the chest or trunk   Other history and clinical information:  "

## 2024-02-15 ENCOUNTER — Ambulatory Visit

## 2024-02-15 DIAGNOSIS — N63 Unspecified lump in unspecified breast: Secondary | ICD-10-CM

## 2024-02-15 DIAGNOSIS — I890 Lymphedema, not elsewhere classified: Secondary | ICD-10-CM

## 2024-02-15 DIAGNOSIS — Z483 Aftercare following surgery for neoplasm: Secondary | ICD-10-CM

## 2024-02-15 DIAGNOSIS — R293 Abnormal posture: Secondary | ICD-10-CM

## 2024-02-15 DIAGNOSIS — C50411 Malignant neoplasm of upper-outer quadrant of right female breast: Secondary | ICD-10-CM

## 2024-02-15 NOTE — Therapy (Signed)
 " OUTPATIENT PHYSICAL THERAPY  UPPER EXTREMITY ONCOLOGY TREATMENT  Patient Name: Kara Webb MRN: [id number] DOB:09/21/1980, 44 y.o., female Today's Date: 02/15/2024  END OF SESSION:  PT End of Session - 02/15/24 1108     Visit Number 11    Number of Visits 12    Date for Recertification  02/21/24    PT Start Time 1105    PT Stop Time 1200    PT Time Calculation (min) 55 min    Activity Tolerance Patient tolerated treatment well    Behavior During Therapy [facility] for tasks assessed/performed               Past Medical History:  Diagnosis Date   Breast cancer (HCC)    BV (bacterial vaginosis)    Cold intolerance 01/16/2005   Fatigue 01/16/2005   H/O chlamydia infection 08/12/2010   H/O urinary frequency 11/16/2000   H/O vaginal discharge 11/17/2003   H/O varicella    Heart murmur    Born with murmur   History of radiation therapy    Right breast-07/05/23-08/20/23- Dr. Lynwood Nasuti   HSV-1 infection    Hx: UTI (urinary tract infection)    Pelvic pain    Vulvovaginal candidiasis 04/16/2005   Yeast infection    Past Surgical History:  Procedure Laterality Date   BREAST BIOPSY Right 12/18/2022   US  RT BREAST BX W LOC DEV 1ST LESION IMG BX SPEC US  GUIDE 12/18/2022 [facility]   BREAST BIOPSY  06/01/2023   US  RT RADIOACTIVE SEED LOC 06/01/2023 [facility]   BREAST BIOPSY  06/01/2023   MM RT RADIOACTIVE SEED LOC MAMMO GUIDE 06/01/2023 [facility]   BREAST BIOPSY  06/01/2023   MM RT RADIOACTIVE SEED EA ADD LESION LOC MAMMO GUIDE 06/01/2023 [facility]   BREAST LUMPECTOMY WITH RADIOACTIVE SEED AND SENTINEL LYMPH NODE BIOPSY Right 06/05/2023   Procedure: RIGHT BREAST SEED LUMPECTOMY x2, RIGHT SEED TARGETED LYMPH NODE BIOPSY, RIGHT SENTINEL LYMPH NODE MAPPING;  Surgeon: Vanderbilt Ned, MD;  Location: [facility];  Service: General;  Laterality: Right;  RIGHT BREAST SEED LUMPECTOMY x2, RIGHT SEED TARGETED LYMPH NODE BIOPSY, RIGHT SENTINEL  LYMPH NODE MAPPING 90 MIN   IR CV LINE INJECTION  04/13/2023   PORT-A-CATH REMOVAL Right 12/06/2023   Procedure: REMOVAL PORT-A-CATH;  Surgeon: Vanderbilt Ned, MD;  Location: [facility];  Service: General;  Laterality: Right;   PORTACATH PLACEMENT N/A 01/03/2023   Procedure: INSERTION PORT-A-CATH WITH ULTRASOUND ANDREA;  Surgeon: Vanderbilt Ned, MD;  Location: [facility];  Service: General;  Laterality: N/A;   TOE SURGERY  2004   WISDOM TOOTH EXTRACTION  2003   Patient Active Problem List   Diagnosis Date Noted   Port-A-Cath in place 01/12/2023   Genetic testing 01/12/2023   Family history of breast cancer 12/27/2022   Family history of colon cancer 12/27/2022   Malignant neoplasm of upper-outer quadrant of right breast in female, estrogen receptor negative (HCC) 12/25/2022    PCP:   REFERRING PROVIDER: Mackey Chad, MD  REFERRING DIAG: Right breast Lymphedema  THERAPY DIAG:  Lymphedema, not elsewhere classified  Aftercare following surgery for neoplasm  Breast swelling  Malignant neoplasm of upper-outer quadrant of right breast in female, estrogen receptor negative (HCC)  Abnormal posture  ONSET DATE: October 2025  Rationale for Evaluation and Treatment: Rehabilitation  SUBJECTIVE:  SUBJECTIVE STATEMENT:  I felt so good after our last session. I've been trying to stretch the cord more and I feel like it's a little looser.   EVAL Pt noticed that her breast skin looks like orange peel. No soreness in breast. It feels tighter in the right breast. I started with my compression bra last week. She sees a print of her bra on her breast. Orange peel has improved some since wearing compression.  I feel a little limited in my shoulder reaching into the back  seat.  PERTINENT HISTORY:   Patient was diagnosed on 12/12/2022 with right grade 2 invasive ductal carcinoma breast cancer. It measures 1.3 cm and is located in the upper outer quadrant. It is ER/PR negative and HER2 positive with a Ki67 of 30%. She had a positive axillary lymph node. Completed neoadjuvant chemo. Underwent R breast lumpectomy and SLNB 0/1 on 06/05/23.  PAIN:   Are you having pain? No  PRECAUTIONS: Right UE lymphedema risk  RED FLAGS: None   WEIGHT BEARING RESTRICTIONS: No  FALLS:  Has patient fallen in last 6 months? No  LIVING ENVIRONMENT: Lives with: lives with their spouse, and son who is in college at [employer] Lives in: House/apartment   OCCUPATION: Adjustor for an insurance company   LEISURE: spend time with family and friends, cooking, phone,  HAND DOMINANCE: right   PRIOR LEVEL OF FUNCTION: Independent  PATIENT GOALS: Decrease breast swelling   OBJECTIVE: Note: Objective measures were completed at Evaluation unless otherwise noted.  COGNITION: Overall cognitive status: Within functional limits for tasks assessed   PALPATION: Mild tenderness at lateral breast incision,  OBSERVATIONS / OTHER ASSESSMENTS: mildly enlarged pores and orange peel greatest at medial breast, mild fibrosis medial breast with tenderness  SENSATION: Light touch: Deficits     POSTURE: forward head, rounded shoulders  UPPER EXTREMITY AROM/PROM:  A/PROM RIGHT   eval  RIGHT 01/07/2024  Shoulder extension 60   Shoulder flexion 153 158  Shoulder abduction 149 171  Shoulder internal rotation 70   Shoulder external rotation 106     (Blank rows = not tested)  A/PROM LEFT   eval  Shoulder extension 57  Shoulder flexion 163  Shoulder abduction 175  Shoulder internal rotation 68  Shoulder external rotation 103    (Blank rows = not tested)  CERVICAL AROM: All within fxl limits:     UPPER EXTREMITY STRENGTH:   LYMPHEDEMA ASSESSMENTS:   SURGERY TYPE/DATE: R  breast lumpectomy and SLNB 0/1 on 06/05/23.  NUMBER OF LYMPH NODES REMOVED: 0/1  CHEMOTHERAPY: YES  RADIATION:YES  HORMONE TREATMENT: NO  INFECTIONS: NO   LYMPHEDEMA ASSESSMENTS:   LANDMARK RIGHT  eval  At axilla    15 cm proximal to the proximal aspect of the olecranon process   10 cm proximal to the proximal aspect of the olecranon process   Olecranon process   15 cm proximal to the proximal aspect of the ulnar styloid process   10 cm proximal to the proximal aspect of the ulnar styloid process   Just distal to the ulnar styloid process   Across hand at thumb web space   At base of 2nd digit   (Blank rows = not tested)  LANDMARK LEFT  eval  At axilla    15 cm proximal to the proximal aspect of the olecranon process   10 cm proximal to the proximal aspect of the olecranon process   Olecranon process   15 cm proximal to the proximal  aspect of the ulnar styloid process   10 cm proximal to  the proximal aspect of the ulnar styloid process   Just distal to the ulnar styloid process   Across hand at thumb web space   At base of 2nd digit   (Blank rows = not tested)  Chest circumference just inferior to the axillae: 119.5 Chest circumference at the largest point:  132 cm   BREAST COMPLAINTS QUESTIONNAIRE   EVAL   01/29/24 Pain:  0   1 Heaviness: 0   1 Swollen feeling:5   5 Tense Skin: 5   0 Redness: 0   0 Bra Print: 5   5 Size of Pores: 8   2 Hard feeling:  1   1 Total:     24 /80   15/80 A Score over 9 indicates lymphedema issues in the breast                                                                                                                             TREATMENT DATE:  Pt permission and consent throughout each step of examination and treatment with modification and draping if requested when working on sensitive areas.  Pt is aware that a clinic chaperone is not available.   02/15/24: MLD: In supine: Short neck, superficial and deep abdominals, Lt  axillary and pectoral nodes, anterior intact sequence and establishment of anterior interaxillary pathway, Rt inguinal nodes and establishment of Rt axilloinguinal pathway, then Rt breast moving fluid towards pathways spending extra time in area of fibrosis at medial breast then retracing all steps and ending with LN's.  P/ROM in supine to Rt shoulder into flex, abd and D2 with scapular depression throughout by therapist MFR to cording in Rt axilla, still visible but not quite as tight today.  02/13/24: MLD: In supine: Short neck, superficial and deep abdominals, Lt axillary nodes and establishment of anterior interaxillary pathway, Rt inguinal nodes and establishment of Rt axilloinguinal pathway, then Rt breast moving fluid towards pathways spending extra time in area of fibrosis at medial breast then retracing all steps and ending with LN's.  P/ROM in supine to Rt shoulder into flex, abd and D2 with scapular depression throughout by therapist MFR to cording that was found in Rt axilla. 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  02/07/2023 MLD: In supine: Short neck, superficial and deep abdominals, bil axillary nodes and establishment of interaxillary pathway, R inguinal nodes and establishment of axilloinguinal pathway, then R breast moving fluid towards pathways spending extra time in any areas of fibrosis then retracing all steps and ending with LN's.      PATIENT EDUCATION:  Education details: Educated pt in supine wand flexion and scaption and wall slides to restore normal Right shoulder ROM. Briefly discussed role of lymphatics and gentleness of technique with Manual lymphatic drainage. Discussed POC, LOS, treatment interventions, gave script for compression bra Person educated: Patient  Education method: Explanation, Demonstration, and Handouts Education comprehension: verbalized understanding and returned demonstration  HOME EXERCISE  PROGRAM: Supine wand flexion and scaption, standing wall slides for abduction.   ASSESSMENT:  CLINICAL IMPRESSION: Continued MLD for the Rt breast, fibrosis is less today than last session. Also continued with MFR to cording in Rt axilla that was slightly less taut today.     EVAL Patient is a 44 y.o. female who was seen today for physical therapy evaluation and treatment for complaints of right breast swelling/lymphedema which started in October 2025. She is s/p neoadjuvant chemotherapy and R breast lumpectomy with SLNB 0+/1 on 06/05/23. She completed radiation, and shortly after radiation developed some swelling in the right breast. It has improved some since starting back to her compression bra. She presents with left breast swelling with mild fibrosis and enlarged pores greatest medially. Incisions are well healed. Shoulder ROM on the right is mildly limited and she was given a HEP today to perform that will address the restricted areas. She was briefly educated in the lymphatic system today in preparation for manual lymphatic drainage techniques next visit.She will benefit from skilled PT to address deficits noted and return pt to PLOF.   OBJECTIVE IMPAIRMENTS: decreased knowledge of condition, increased edema, impaired flexibility, impaired UE functional use, and postural dysfunction.   ACTIVITY LIMITATIONS: lifting and reach over head  PARTICIPATION LIMITATIONS: none  PERSONAL FACTORS: 1-2 comorbidities: Right breast cancer s/p chemotherapy and radiation are also affecting patient's functional outcome.   REHAB POTENTIAL: Good  CLINICAL DECISION MAKING: Stable/uncomplicated  EVALUATION COMPLEXITY: Low  GOALS: Goals reviewed with patient? Yes  SHORT TERM GOALS=LONG TERM GOALS: Target date: 02/21/2023  Pt will be independent with MLD to decrease right breast swelling Baseline: Goal status: MET  2.  Pt will note decreased breast swelling by 50% Baseline:  Goal status: REPORTS no  change in swelling  3.  Pt will be compliant with compression bra to decrease right breast lymphedema Baseline:  Goal status: MET  4.  Breast complaints questionnaire will decrease by at least 10 points to demonstrate improvement in swelling Baseline:  Goal status: IN PROGRESS     PLAN:  PT FREQUENCY: 2x/week  PT DURATION: 6 weeks  PLANNED INTERVENTIONS: 97164- PT Re-evaluation, 97110-Therapeutic exercises, 97530- Therapeutic activity, 97112- Neuromuscular re-education, 97535- Self Care, [id number]- Manual therapy, 97760- Orthotic Initial, and [id number]- Orthotic/Prosthetic subsequent  PLAN FOR NEXT SESSION:  Has pt heard from Flexi? review and cont MLD to right breast, check ROM  Aden Berwyn Caldron, PTA 02/15/2024, 12:05 PM   DIAGNOSIS ASSESSMENT [x]  Lymphedema, not elsewhere classified [I89.0]  Secondary to:  []  Post-mastectomy lymphedema [I97.2]   []  Hereditary/Congenital lymphedema [Q82.0]  []  Praecox []  Tarda []  Other:   LOCATION OF LYMPHEDEMA []   Right Arm  []  Left Arm  [x]  Chest/Breast  []  Abdomen/Trunk []  Other: Date of onset, or duration: 11/17/2023  SEVERITY Symptoms and observations from physical exam Stage of Lymphedema:  [x] Stage II  []  Stage III  [] Other: [x]  Hyperkeratosis [x]  Hyperpigmentation []  Lymphorrhea (weeping) []  Papillomatosis (warts, nodules, papules)  []  Elephantiasis  [x]  Fibrosis and/or radiation fibrosis []  Pitting edema []  Recurrent episode(s) of infection/cellulitis [x]  Pain [x]  Peau d'orange [x]  Chest and/or axillary swelling caused by: []  History of chest/breast/abdominal surgery(ies) [x]  Lymph node dissection, radiation and or chemotherapy [x]  Truncal and/or abdominal swelling []  Axillary cording []  Other, explain:  CONSERVATIVE TREATMENT (CT) INITATION Start date (if different from this visit date):  Conservative treatment plan includes the following activities (  check all that apply): [x]  Regular exercise  [x]  Elevation of  the limb(s) [x]  Compression garment(s) or compression bandage system  []  Compression garments with a minimum of             mmHg distally  [x]  Compression bra and/or tank top []  Compression bandage system  Type: []  Multi-layer short stretch []  Velcro reduction kit    []  Two- or three-layer prepackaged compression kit   []  Other:  []  Complete decongestive therapy (CDT) or lymphedema therapy   [x]  Minimum of 30 minutes daily MLD or self-MLD  []  Diet assessment with modification, if applicable. []  For patients with congestive heart failure (CHF) a medication assessment with modification(s), if applicable. []  Other:   Has the patient tried an [id number] basic pneumatic compression device? []  Yes  [x]  No Comments:   MEASUREMENTS Choose at least two anatomical locations.  []   Right Arm  01/24/24  []   Left Arm    Wrist                     cm  Wrist                     cm  Forearm                     cm  Forearm                     cm  Biceps                     cm  Biceps                     cm  Axilla (Chest)*  122                cm  Axilla (Chest)*                     cm  Umbilicus (Waist)*         125            cm  Umbilicus (Waist)*                     cm  *Include measurements if patient presents with lymphedema proximally into the chest or trunk   Other history and clinical information:  "

## 2024-02-19 ENCOUNTER — Ambulatory Visit: Attending: Hematology and Oncology | Admitting: Rehabilitation

## 2024-02-19 ENCOUNTER — Encounter: Payer: Self-pay | Admitting: Rehabilitation

## 2024-02-19 DIAGNOSIS — N63 Unspecified lump in unspecified breast: Secondary | ICD-10-CM

## 2024-02-19 DIAGNOSIS — I890 Lymphedema, not elsewhere classified: Secondary | ICD-10-CM

## 2024-02-19 DIAGNOSIS — Z171 Estrogen receptor negative status [ER-]: Secondary | ICD-10-CM

## 2024-02-19 DIAGNOSIS — R293 Abnormal posture: Secondary | ICD-10-CM

## 2024-02-19 DIAGNOSIS — Z483 Aftercare following surgery for neoplasm: Secondary | ICD-10-CM

## 2024-02-21 ENCOUNTER — Encounter: Payer: Self-pay | Admitting: Rehabilitation

## 2024-02-21 ENCOUNTER — Ambulatory Visit: Admitting: Rehabilitation

## 2024-02-21 DIAGNOSIS — R293 Abnormal posture: Secondary | ICD-10-CM

## 2024-02-21 DIAGNOSIS — Z483 Aftercare following surgery for neoplasm: Secondary | ICD-10-CM

## 2024-02-21 DIAGNOSIS — I890 Lymphedema, not elsewhere classified: Secondary | ICD-10-CM

## 2024-02-21 DIAGNOSIS — N63 Unspecified lump in unspecified breast: Secondary | ICD-10-CM

## 2024-02-21 DIAGNOSIS — C50411 Malignant neoplasm of upper-outer quadrant of right female breast: Secondary | ICD-10-CM

## 2024-02-21 NOTE — Therapy (Signed)
 " OUTPATIENT PHYSICAL THERAPY  UPPER EXTREMITY ONCOLOGY TREATMENT  Patient Name: Kara Webb MRN: [id number] DOB:1980-12-10, 44 y.o., female Today's Date: 02/21/2024  END OF SESSION:  PT End of Session - 02/21/24 1701     Visit Number 13    PT Start Time 1603    PT Stop Time 1653    PT Time Calculation (min) 50 min    Activity Tolerance Patient tolerated treatment well    Behavior During Therapy [facility] for tasks assessed/performed                Past Medical History:  Diagnosis Date   Breast cancer (HCC)    BV (bacterial vaginosis)    Cold intolerance 01/16/2005   Fatigue 01/16/2005   H/O chlamydia infection 08/12/2010   H/O urinary frequency 11/16/2000   H/O vaginal discharge 11/17/2003   H/O varicella    Heart murmur    Born with murmur   History of radiation therapy    Right breast-07/05/23-08/20/23- Dr. Lynwood Nasuti   HSV-1 infection    Hx: UTI (urinary tract infection)    Pelvic pain    Vulvovaginal candidiasis 04/16/2005   Yeast infection    Past Surgical History:  Procedure Laterality Date   BREAST BIOPSY Right 12/18/2022   US  RT BREAST BX W LOC DEV 1ST LESION IMG BX SPEC US  GUIDE 12/18/2022 [facility]   BREAST BIOPSY  06/01/2023   US  RT RADIOACTIVE SEED LOC 06/01/2023 [facility]   BREAST BIOPSY  06/01/2023   MM RT RADIOACTIVE SEED LOC MAMMO GUIDE 06/01/2023 [facility]   BREAST BIOPSY  06/01/2023   MM RT RADIOACTIVE SEED EA ADD LESION LOC MAMMO GUIDE 06/01/2023 [facility]   BREAST LUMPECTOMY WITH RADIOACTIVE SEED AND SENTINEL LYMPH NODE BIOPSY Right 06/05/2023   Procedure: RIGHT BREAST SEED LUMPECTOMY x2, RIGHT SEED TARGETED LYMPH NODE BIOPSY, RIGHT SENTINEL LYMPH NODE MAPPING;  Surgeon: Vanderbilt Ned, MD;  Location: [facility];  Service: General;  Laterality: Right;  RIGHT BREAST SEED LUMPECTOMY x2, RIGHT SEED TARGETED LYMPH NODE BIOPSY, RIGHT SENTINEL LYMPH NODE MAPPING 90 MIN   IR CV LINE INJECTION  04/13/2023    PORT-A-CATH REMOVAL Right 12/06/2023   Procedure: REMOVAL PORT-A-CATH;  Surgeon: Vanderbilt Ned, MD;  Location: [facility];  Service: General;  Laterality: Right;   PORTACATH PLACEMENT N/A 01/03/2023   Procedure: INSERTION PORT-A-CATH WITH ULTRASOUND ANDREA;  Surgeon: Vanderbilt Ned, MD;  Location: [facility];  Service: General;  Laterality: N/A;   TOE SURGERY  2004   WISDOM TOOTH EXTRACTION  2003   Patient Active Problem List   Diagnosis Date Noted   Port-A-Cath in place 01/12/2023   Genetic testing 01/12/2023   Family history of breast cancer 12/27/2022   Family history of colon cancer 12/27/2022   Malignant neoplasm of upper-outer quadrant of right breast in female, estrogen receptor negative (HCC) 12/25/2022    PCP:   REFERRING PROVIDER: Mackey Chad, MD  REFERRING DIAG: Right breast Lymphedema  THERAPY DIAG:  Lymphedema, not elsewhere classified  Aftercare following surgery for neoplasm  Breast swelling  Malignant neoplasm of upper-outer quadrant of right breast in female, estrogen receptor negative (HCC)  Abnormal posture  ONSET DATE: October 2025  Rationale for Evaluation and Treatment: Rehabilitation  SUBJECTIVE:  SUBJECTIVE STATEMENT:  I feel like I can be done.  EVAL Pt noticed that her breast skin looks like orange peel. No soreness in breast. It feels tighter in the right breast. I started with my compression bra last week. She sees a print of her bra on her breast. Orange peel has improved some since wearing compression.  I feel a little limited in my shoulder reaching into the back seat.  PERTINENT HISTORY:   Patient was diagnosed on 12/12/2022 with right grade 2 invasive ductal carcinoma breast cancer. It measures 1.3 cm and is located in  the upper outer quadrant. It is ER/PR negative and HER2 positive with a Ki67 of 30%. She had a positive axillary lymph node. Completed neoadjuvant chemo. Underwent R breast lumpectomy and SLNB 0/1 on 06/05/23.  PAIN:   Are you having pain? No  PRECAUTIONS: Right UE lymphedema risk  RED FLAGS: None   WEIGHT BEARING RESTRICTIONS: No  FALLS:  Has patient fallen in last 6 months? No  LIVING ENVIRONMENT: Lives with: lives with their spouse, and son who is in college at [employer] Lives in: House/apartment   OCCUPATION: Adjustor for an insurance company   LEISURE: spend time with family and friends, cooking, phone,  HAND DOMINANCE: right   PRIOR LEVEL OF FUNCTION: Independent  PATIENT GOALS: Decrease breast swelling   OBJECTIVE: Note: Objective measures were completed at Evaluation unless otherwise noted.  COGNITION: Overall cognitive status: Within functional limits for tasks assessed   PALPATION: Mild tenderness at lateral breast incision,  OBSERVATIONS / OTHER ASSESSMENTS: mildly enlarged pores and orange peel greatest at medial breast, mild fibrosis medial breast with tenderness  SENSATION: Light touch: Deficits     POSTURE: forward head, rounded shoulders  UPPER EXTREMITY AROM/PROM:  A/PROM RIGHT   eval  RIGHT 01/07/2024  Shoulder extension 60   Shoulder flexion 153 158  Shoulder abduction 149 171  Shoulder internal rotation 70   Shoulder external rotation 106     (Blank rows = not tested)  A/PROM LEFT   eval  Shoulder extension 57  Shoulder flexion 163  Shoulder abduction 175  Shoulder internal rotation 68  Shoulder external rotation 103    (Blank rows = not tested)  CERVICAL AROM: All within fxl limits:   UPPER EXTREMITY STRENGTH:   LYMPHEDEMA ASSESSMENTS:   SURGERY TYPE/DATE: R breast lumpectomy and SLNB 0/1 on 06/05/23.  NUMBER OF LYMPH NODES REMOVED: 0/1  CHEMOTHERAPY: YES  RADIATION:YES  HORMONE TREATMENT: NO  INFECTIONS:  NO   LYMPHEDEMA ASSESSMENTS:   LANDMARK RIGHT  eval  At axilla    15 cm proximal to the proximal aspect of the olecranon process   10 cm proximal to the proximal aspect of the olecranon process   Olecranon process   15 cm proximal to the proximal aspect of the ulnar styloid process   10 cm proximal to the proximal aspect of the ulnar styloid process   Just distal to the ulnar styloid process   Across hand at thumb web space   At base of 2nd digit   (Blank rows = not tested)  LANDMARK LEFT  eval  At axilla    15 cm proximal to the proximal aspect of the olecranon process   10 cm proximal to the proximal aspect of the olecranon process   Olecranon process   15 cm proximal to the proximal aspect of the ulnar styloid process   10 cm proximal to  the proximal aspect of the ulnar styloid  process   Just distal to the ulnar styloid process   Across hand at thumb web space   At base of 2nd digit   (Blank rows = not tested)  Chest circumference just inferior to the axillae: 119.5 Chest circumference at the largest point:  132 cm   BREAST COMPLAINTS QUESTIONNAIRE   EVAL   01/29/24  02/19/24 Pain:  0   1   0 Heaviness: 0   1   1 Swollen feeling:5   5   1  Tense Skin: 5   0   0 Redness: 0   0   0 Bra Print: 5   5   0 Size of Pores: 8   2   1  Hard feeling:  1   1   0 Total:     24 /80   15/80   3/80 A Score over 9 indicates lymphedema issues in the breast                                                                                                                             TREATMENT DATE:  Pt permission and consent throughout each step of examination and treatment with modification and draping if requested when working on sensitive areas.  Pt is aware that a clinic chaperone is not available.   02/21/24: MLD: In supine: Short neck, superficial and deep abdominals, Lt axillary and pectoral nodes, anterior intact sequence and establishment of anterior interaxillary pathway, Rt  inguinal nodes and establishment of Rt axilloinguinal pathway, then Rt breast moving fluid towards pathways spending extra time in area of fibrosis at medial breast then retracing all steps and ending with LN's.  P/ROM in supine to Rt shoulder into flex, abd and D2 with scapular depression throughout by therapist MFR to cording in Rt axilla  02/19/24: MLD: In supine: Short neck, superficial and deep abdominals, Lt axillary and pectoral nodes, anterior intact sequence and establishment of anterior interaxillary pathway, Rt inguinal nodes and establishment of Rt axilloinguinal pathway, then Rt breast moving fluid towards pathways spending extra time in area of fibrosis at medial breast then retracing all steps and ending with LN's.  P/ROM in supine to Rt shoulder into flex, abd and D2 with scapular depression throughout by therapist MFR to cording in Rt axilla  02/15/24: MLD: In supine: Short neck, superficial and deep abdominals, Lt axillary and pectoral nodes, anterior intact sequence and establishment of anterior interaxillary pathway, Rt inguinal nodes and establishment of Rt axilloinguinal pathway, then Rt breast moving fluid towards pathways spending extra time in area of fibrosis at medial breast then retracing all steps and ending with LN's.  P/ROM in supine to Rt shoulder into flex, abd and D2 with scapular depression throughout by therapist MFR to cording in Rt axilla, still visible but not quite as tight today.  PATIENT EDUCATION:  Education details: Educated pt in supine wand flexion and scaption and wall slides to restore normal Right shoulder ROM. Briefly  discussed role of lymphatics and gentleness of technique with Manual lymphatic drainage. Discussed POC, LOS, treatment interventions, gave script for compression bra Person educated: Patient Education method: Explanation, Demonstration, and Handouts Education comprehension: verbalized understanding and returned demonstration  HOME  EXERCISE PROGRAM: Supine wand flexion and scaption, standing wall slides for abduction.   ASSESSMENT:  CLINICAL IMPRESSION: Continued MLD for the Rt breast, overall pt is ready for D/C after last visit.  The breast edema questionnaire is now 3/80 .  The breast is much softer since completing PT.     EVAL Patient is a 44 y.o. female who was seen today for physical therapy evaluation and treatment for complaints of right breast swelling/lymphedema which started in October 2025. She is s/p neoadjuvant chemotherapy and R breast lumpectomy with SLNB 0+/1 on 06/05/23. She completed radiation, and shortly after radiation developed some swelling in the right breast. It has improved some since starting back to her compression bra. She presents with left breast swelling with mild fibrosis and enlarged pores greatest medially. Incisions are well healed. Shoulder ROM on the right is mildly limited and she was given a HEP today to perform that will address the restricted areas. She was briefly educated in the lymphatic system today in preparation for manual lymphatic drainage techniques next visit.She will benefit from skilled PT to address deficits noted and return pt to PLOF.    OBJECTIVE IMPAIRMENTS: decreased knowledge of condition, increased edema, impaired flexibility, impaired UE functional use, and postural dysfunction.   ACTIVITY LIMITATIONS: lifting and reach over head  PARTICIPATION LIMITATIONS: none  PERSONAL FACTORS: 1-2 comorbidities: Right breast cancer s/p chemotherapy and radiation are also affecting patient's functional outcome.   REHAB POTENTIAL: Good  CLINICAL DECISION MAKING: Stable/uncomplicated  EVALUATION COMPLEXITY: Low  GOALS: Goals reviewed with patient? Yes  SHORT TERM GOALS=LONG TERM GOALS: Target date: 02/21/2023  Pt will be independent with MLD to decrease right breast swelling Baseline: Goal status: MET  2.  Pt will note decreased breast swelling by 50% Baseline:   Goal status: MET  3.  Pt will be compliant with compression bra to decrease right breast lymphedema Baseline:  Goal status: MET  4.  Breast complaints questionnaire will decrease by at least 10 points to demonstrate improvement in swelling Baseline:  Goal status: MET    PLAN:  PT FREQUENCY: 2x/week  PT DURATION: 6 weeks  PLANNED INTERVENTIONS: 97164- PT Re-evaluation, 97110-Therapeutic exercises, 97530- Therapeutic activity, 97112- Neuromuscular re-education, 97535- Self Care, [id number]- Manual therapy, 97760- Orthotic Initial, and [id number]- Orthotic/Prosthetic subsequent  PLAN FOR NEXT SESSION:  Has pt heard from Flexi? review and cont MLD to right breast, check ROM  Leeam Cedrone, Saddie SAUNDERS, PT 02/21/2024, 5:02 PM   DIAGNOSIS ASSESSMENT [x]  Lymphedema, not elsewhere classified [I89.0]  Secondary to:  []  Post-mastectomy lymphedema [I97.2]   []  Hereditary/Congenital lymphedema [Q82.0]  []  Praecox []  Tarda []  Other:   LOCATION OF LYMPHEDEMA []   Right Arm  []  Left Arm  [x]  Chest/Breast  []  Abdomen/Trunk []  Other: Date of onset, or duration: 11/17/2023  SEVERITY Symptoms and observations from physical exam Stage of Lymphedema:  [x] Stage II  []  Stage III  [] Other: [x]  Hyperkeratosis [x]  Hyperpigmentation []  Lymphorrhea (weeping) []  Papillomatosis (warts, nodules, papules)  []  Elephantiasis  [x]  Fibrosis and/or radiation fibrosis []  Pitting edema []  Recurrent episode(s) of infection/cellulitis [x]  Pain [x]  Peau d'orange [x]  Chest and/or axillary swelling caused by: []  History of chest/breast/abdominal surgery(ies) [x]  Lymph node dissection, radiation and or chemotherapy [x]  Truncal and/or abdominal swelling []  Axillary  cording []  Other, explain:  CONSERVATIVE TREATMENT (CT) INITATION Start date (if different from this visit date):  Conservative treatment plan includes the following activities (check all that apply): [x]  Regular exercise  [x]  Elevation of the limb(s) [x]   Compression garment(s) or compression bandage system  []  Compression garments with a minimum of             mmHg distally  [x]  Compression bra and/or tank top []  Compression bandage system  Type: []  Multi-layer short stretch []  Velcro reduction kit    []  Two- or three-layer prepackaged compression kit   []  Other:  []  Complete decongestive therapy (CDT) or lymphedema therapy   [x]  Minimum of 30 minutes daily MLD or self-MLD  []  Diet assessment with modification, if applicable. []  For patients with congestive heart failure (CHF) a medication assessment with modification(s), if applicable. []  Other:   Has the patient tried an [id number] basic pneumatic compression device? []  Yes  [x]  No Comments:   MEASUREMENTS Choose at least two anatomical locations.  []   Right Arm  01/24/24  []   Left Arm    Wrist                     cm  Wrist                     cm  Forearm                     cm  Forearm                     cm  Biceps                     cm  Biceps                     cm  Axilla (Chest)*  122                cm  Axilla (Chest)*                     cm  Umbilicus (Waist)*         125            cm  Umbilicus (Waist)*                     cm  *Include measurements if patient presents with lymphedema proximally into the chest or trunk   Other history and clinical information:  "

## 2024-02-27 ENCOUNTER — Inpatient Hospital Stay: Admitting: Adult Health

## 2024-03-03 ENCOUNTER — Inpatient Hospital Stay: Payer: Self-pay | Attending: Hematology and Oncology | Admitting: Adult Health

## 2024-03-03 ENCOUNTER — Inpatient Hospital Stay: Payer: Self-pay

## 2024-03-10 ENCOUNTER — Ambulatory Visit
